# Patient Record
Sex: Female | Born: 1950 | Race: White | Hispanic: No | Marital: Married | Smoking: Former smoker
Health system: Southern US, Community
[De-identification: ages and names within clinical notes are randomized; demographics above are authoritative.]

## PROBLEM LIST (undated history)

## (undated) DIAGNOSIS — Z8619 Personal history of other infectious and parasitic diseases: Secondary | ICD-10-CM

## (undated) DIAGNOSIS — E785 Hyperlipidemia, unspecified: Secondary | ICD-10-CM

## (undated) DIAGNOSIS — J189 Pneumonia, unspecified organism: Secondary | ICD-10-CM

## (undated) DIAGNOSIS — E079 Disorder of thyroid, unspecified: Secondary | ICD-10-CM

## (undated) DIAGNOSIS — N200 Calculus of kidney: Secondary | ICD-10-CM

## (undated) DIAGNOSIS — B019 Varicella without complication: Secondary | ICD-10-CM

## (undated) DIAGNOSIS — I700 Atherosclerosis of aorta: Secondary | ICD-10-CM

## (undated) DIAGNOSIS — M5030 Other cervical disc degeneration, unspecified cervical region: Secondary | ICD-10-CM

## (undated) DIAGNOSIS — R17 Unspecified jaundice: Secondary | ICD-10-CM

## (undated) DIAGNOSIS — E039 Hypothyroidism, unspecified: Secondary | ICD-10-CM

## (undated) DIAGNOSIS — C8221 Follicular lymphoma grade III, unspecified, lymph nodes of head, face, and neck: Secondary | ICD-10-CM

## (undated) DIAGNOSIS — Z87442 Personal history of urinary calculi: Secondary | ICD-10-CM

## (undated) HISTORY — DX: Hyperlipidemia, unspecified: E78.5

## (undated) HISTORY — DX: Unspecified jaundice: R17

## (undated) HISTORY — DX: Varicella without complication: B01.9

## (undated) HISTORY — PX: ROOT CANAL: SHX2363

## (undated) HISTORY — DX: Follicular lymphoma grade III, unspecified, lymph nodes of head, face, and neck: C82.21

## (undated) HISTORY — DX: Disorder of thyroid, unspecified: E07.9

## (undated) HISTORY — DX: Atherosclerosis of aorta: I70.0

## (undated) HISTORY — DX: Personal history of other infectious and parasitic diseases: Z86.19

## (undated) HISTORY — DX: Calculus of kidney: N20.0

---

## 1970-04-28 DIAGNOSIS — K759 Inflammatory liver disease, unspecified: Secondary | ICD-10-CM

## 1970-04-28 HISTORY — DX: Inflammatory liver disease, unspecified: K75.9

## 1977-04-28 HISTORY — PX: APPENDECTOMY: SHX54

## 1978-04-28 HISTORY — PX: VAGINAL HYSTERECTOMY: SUR661

## 1978-04-28 HISTORY — PX: ABDOMINAL HYSTERECTOMY: SHX81

## 2009-04-28 HISTORY — PX: CHOLECYSTECTOMY: SHX55

## 2010-04-28 HISTORY — PX: OTHER SURGICAL HISTORY: SHX169

## 2011-02-11 DIAGNOSIS — R32 Unspecified urinary incontinence: Secondary | ICD-10-CM | POA: Insufficient documentation

## 2011-12-31 ENCOUNTER — Encounter: Payer: Self-pay | Admitting: Internal Medicine

## 2011-12-31 ENCOUNTER — Ambulatory Visit (INDEPENDENT_AMBULATORY_CARE_PROVIDER_SITE_OTHER): Payer: BC Managed Care – PPO | Admitting: Internal Medicine

## 2011-12-31 VITALS — BP 110/70 | HR 62 | Temp 98.2°F | Ht 62.75 in | Wt 131.0 lb

## 2011-12-31 DIAGNOSIS — Z0000 Encounter for general adult medical examination without abnormal findings: Secondary | ICD-10-CM

## 2011-12-31 DIAGNOSIS — M545 Low back pain, unspecified: Secondary | ICD-10-CM

## 2011-12-31 DIAGNOSIS — R232 Flushing: Secondary | ICD-10-CM | POA: Insufficient documentation

## 2011-12-31 DIAGNOSIS — E039 Hypothyroidism, unspecified: Secondary | ICD-10-CM | POA: Insufficient documentation

## 2011-12-31 DIAGNOSIS — N951 Menopausal and female climacteric states: Secondary | ICD-10-CM

## 2011-12-31 MED ORDER — LEVOTHYROXINE SODIUM 100 MCG PO TABS: 100.0000 ug | ORAL_TABLET | Freq: Every day | ORAL | Status: DC

## 2011-12-31 MED ORDER — CYCLOBENZAPRINE HCL 5 MG PO TABS: 5.0000 mg | ORAL_TABLET | Freq: Three times a day (TID) | ORAL | Status: AC | PRN

## 2011-12-31 NOTE — Assessment & Plan Note (Signed)
Symptoms consistent with muscular spasm, resolved with flexeril. Will continue flexeril prn.

## 2011-12-31 NOTE — Assessment & Plan Note (Signed)
Likely related to either hypothyroidism or menopause. Will check TSH with labs. If this is normal, would favor increasing dose of Premarin to see if any improvement.

## 2011-12-31 NOTE — Assessment & Plan Note (Signed)
Symptomatic with some intermittent hot flashes. Will check TSH with labs. Continue synthroid.

## 2011-12-31 NOTE — Progress Notes (Signed)
Subjective:    Patient ID: Samantha Booker, female    DOB: 1950/08/12, 61 y.o.   MRN: [id number]  HPI 61 year old female with history of hypothyroidism, rosacea, and low back pain presents to establish care. She reports she is generally feeling well. Her only concern today is some intermittent hot flashes. These occur throughout the day and night. They are occasionally drenching. Patient reports full compliance with both Synthroid and Premarin. She has been on Premarin since the time of her hysterectomy. She denies taking any herbal medications.  She also notes recent episodes of low back pain which wakes her from sleep. She has been taking Flexeril with improvement in her symptoms. She denies any persistent pain or weakness in her legs. She denies any known injury to her back.  Outpatient Encounter Prescriptions as of 12/31/2011  Medication Sig Dispense Refill  . Ascorbic Acid (VITAMIN C) 1000 MG tablet Take 1,000 mg by mouth daily.      Marland Kitchen aspirin 81 MG tablet Take 81 mg by mouth daily.      Marland Kitchen b complex vitamins tablet Take 1 tablet by mouth daily.      Marland Kitchen estrogens, conjugated, (PREMARIN) 0.625 MG tablet Take 0.625 mg by mouth daily. Take daily for 21 days then do not take for 7 days.      . metroNIDAZOLE (METROCREAM) 0.75 % cream Apply 1 application topically 2 (two) times daily.      . Multiple Vitamin (MULTIVITAMIN) tablet Take 1 tablet by mouth daily.      . cyclobenzaprine (FLEXERIL) 5 MG tablet Take 1 tablet (5 mg total) by mouth 3 (three) times daily as needed for muscle spasms.  90 tablet  3  . levothyroxine (SYNTHROID, LEVOTHROID) 100 MCG tablet Take 1 tablet (100 mcg total) by mouth daily.  90 tablet  3   BP 110/70  Pulse 62  Temp 98.2 F (36.8 C) (Oral)  Ht 5' 2.75" (1.594 m)  Wt 131 lb (59.421 kg)  BMI 23.39 kg/m2  SpO2 98%  Review of Systems  Constitutional: Positive for diaphoresis. Negative for fever, chills, appetite change, fatigue and unexpected weight change.  HENT:  Negative for ear pain, congestion, sore throat, trouble swallowing, neck pain, voice change and sinus pressure.   Eyes: Negative for visual disturbance.  Respiratory: Negative for cough, shortness of breath, wheezing and stridor.   Cardiovascular: Negative for chest pain, palpitations and leg swelling.  Gastrointestinal: Negative for nausea, vomiting, abdominal pain, diarrhea, constipation, blood in stool, abdominal distention and anal bleeding.  Genitourinary: Negative for dysuria and flank pain.  Musculoskeletal: Positive for back pain. Negative for myalgias, arthralgias and gait problem.  Skin: Negative for color change and rash.  Neurological: Negative for dizziness and headaches.  Hematological: Negative for adenopathy. Does not bruise/bleed easily.  Psychiatric/Behavioral: Negative for suicidal ideas, disturbed wake/sleep cycle and dysphoric mood. The patient is not nervous/anxious.        Objective:   Physical Exam  Constitutional: She is oriented to person, place, and time. She appears well-developed and well-nourished. No distress.  HENT:  Head: Normocephalic and atraumatic.  Right Ear: External ear normal.  Left Ear: External ear normal.  Nose: Nose normal.  Mouth/Throat: Oropharynx is clear and moist. No oropharyngeal exudate.  Eyes: Conjunctivae are normal. Pupils are equal, round, and reactive to light. Right eye exhibits no discharge. Left eye exhibits no discharge. No scleral icterus.  Neck: Normal range of motion. Neck supple. No tracheal deviation present. No thyromegaly present.  Cardiovascular: Normal rate,  regular rhythm, normal heart sounds and intact distal pulses.  Exam reveals no gallop and no friction rub.   No murmur heard. Pulmonary/Chest: Effort normal and breath sounds normal. No respiratory distress. She has no wheezes. She has no rales. She exhibits no tenderness.  Abdominal: Soft. Bowel sounds are normal. She exhibits no distension. There is no tenderness.    Musculoskeletal: Normal range of motion. She exhibits no edema and no tenderness.  Lymphadenopathy:    She has no cervical adenopathy.  Neurological: She is alert and oriented to person, place, and time. No cranial nerve deficit. She exhibits normal muscle tone. Coordination normal.  Skin: Skin is warm and dry. No rash noted. She is not diaphoretic. No erythema. No pallor.  Psychiatric: She has a normal mood and affect. Her behavior is normal. Judgment and thought content normal.          Assessment & Plan:

## 2012-01-06 ENCOUNTER — Other Ambulatory Visit: Payer: Self-pay | Admitting: *Deleted

## 2012-01-06 MED ORDER — ESTROGENS CONJUGATED 0.625 MG PO TABS: 0.6250 mg | ORAL_TABLET | Freq: Every day | ORAL | Status: DC

## 2012-01-06 MED ORDER — ROSUVASTATIN CALCIUM 10 MG PO TABS: 10.0000 mg | ORAL_TABLET | Freq: Every day | ORAL | Status: DC

## 2012-01-07 ENCOUNTER — Encounter: Payer: Self-pay | Admitting: Internal Medicine

## 2012-01-27 ENCOUNTER — Other Ambulatory Visit (INDEPENDENT_AMBULATORY_CARE_PROVIDER_SITE_OTHER): Payer: BC Managed Care – PPO

## 2012-01-27 ENCOUNTER — Other Ambulatory Visit: Payer: Self-pay | Admitting: Internal Medicine

## 2012-01-27 DIAGNOSIS — Z0000 Encounter for general adult medical examination without abnormal findings: Secondary | ICD-10-CM

## 2012-01-27 DIAGNOSIS — E039 Hypothyroidism, unspecified: Secondary | ICD-10-CM

## 2012-01-27 LAB — COMPREHENSIVE METABOLIC PANEL
Albumin: 3.6 g/dL (ref 3.5–5.2)
CO2: 25 mEq/L (ref 19–32)
Calcium: 8.8 mg/dL (ref 8.4–10.5)
Chloride: 105 mEq/L (ref 96–112)
GFR: 87.01 mL/min (ref >60.00)
Glucose, Bld: 88 mg/dL (ref 70–99)
Potassium: 4.1 mEq/L (ref 3.5–5.1)
Sodium: 137 mEq/L (ref 135–145)
Total Protein: 6.8 g/dL (ref 6.0–8.3)

## 2012-01-27 LAB — LIPID PANEL: Cholesterol: 170 mg/dL (ref 0–200)

## 2012-01-27 NOTE — Telephone Encounter (Signed)
Refill request: Metronidazole 0.75% topical crem gm #45 Sig: apply to face twice daily Levothyroxine sodium 100 mcg tab #30 Sig: take one tablet each day

## 2012-01-28 ENCOUNTER — Encounter: Payer: Self-pay | Admitting: Internal Medicine

## 2012-01-28 MED ORDER — LEVOTHYROXINE SODIUM 100 MCG PO TABS: 100.0000 ug | ORAL_TABLET | Freq: Every day | ORAL | Status: DC

## 2012-01-28 MED ORDER — METRONIDAZOLE 0.75 % EX CREA: 1.0000 "application " | TOPICAL_CREAM | Freq: Two times a day (BID) | CUTANEOUS | Status: DC

## 2012-02-01 ENCOUNTER — Encounter: Payer: Self-pay | Admitting: Internal Medicine

## 2012-02-02 MED ORDER — ZOSTER VACCINE LIVE 19400 UNT/0.65ML ~~LOC~~ SOLR: 0.6500 mL | Freq: Once | SUBCUTANEOUS | Status: DC

## 2012-03-16 ENCOUNTER — Other Ambulatory Visit: Payer: Self-pay | Admitting: *Deleted

## 2012-03-16 ENCOUNTER — Encounter: Payer: Self-pay | Admitting: Internal Medicine

## 2012-03-16 MED ORDER — ESTRADIOL 1 MG PO TABS: 1.0000 mg | ORAL_TABLET | Freq: Every day | ORAL | Status: DC

## 2012-03-16 NOTE — Telephone Encounter (Signed)
Dr. Dan Humphreys, patient is requesting a Rx for Estradiol be sent to her pharmacy but this medication is not on her med list.  Please advise.

## 2012-03-16 NOTE — Telephone Encounter (Signed)
Please also remind pt that use of oral estrogen replacement carries some increased risk for breast cancer. She has a strong family history of breast cancer, so we should limit dose and time of use of supplemental estrogen as much as possible.

## 2012-03-16 NOTE — Telephone Encounter (Signed)
I am a little confused, it looks like from her email on 02/02/2012 she could try either Estradiol or Premarin and the patient wants to try Estradiol.  Please advise.

## 2012-03-16 NOTE — Telephone Encounter (Signed)
We have that she was on Premarin. Can you please confirm this with her? Fine to refill Premarin.

## 2012-03-16 NOTE — Telephone Encounter (Signed)
Patient advised via mychart message.

## 2012-03-16 NOTE — Telephone Encounter (Signed)
I called in the Rx.

## 2012-04-19 ENCOUNTER — Other Ambulatory Visit: Payer: Self-pay | Admitting: Internal Medicine

## 2012-04-19 NOTE — Telephone Encounter (Signed)
Crestor 10 mg tab  Take one tablet each evening as directed  #30

## 2012-04-20 MED ORDER — ROSUVASTATIN CALCIUM 10 MG PO TABS: 10.0000 mg | ORAL_TABLET | Freq: Every day | ORAL | Status: DC

## 2012-05-03 ENCOUNTER — Ambulatory Visit (INDEPENDENT_AMBULATORY_CARE_PROVIDER_SITE_OTHER): Payer: BC Managed Care – PPO | Admitting: Internal Medicine

## 2012-05-03 ENCOUNTER — Encounter: Payer: Self-pay | Admitting: Internal Medicine

## 2012-05-03 VITALS — BP 120/80 | HR 60 | Temp 98.7°F | Ht 62.75 in | Wt 133.5 lb

## 2012-05-03 DIAGNOSIS — N951 Menopausal and female climacteric states: Secondary | ICD-10-CM | POA: Insufficient documentation

## 2012-05-03 DIAGNOSIS — E785 Hyperlipidemia, unspecified: Secondary | ICD-10-CM

## 2012-05-03 DIAGNOSIS — Z0000 Encounter for general adult medical examination without abnormal findings: Secondary | ICD-10-CM

## 2012-05-03 LAB — COMPREHENSIVE METABOLIC PANEL
AST: 29 U/L (ref 0–37)
Albumin: 3.7 g/dL (ref 3.5–5.2)
Alkaline Phosphatase: 43 U/L (ref 39–117)
BUN: 15 mg/dL (ref 6–23)
Creatinine, Ser: 0.7 mg/dL (ref 0.4–1.2)
Potassium: 4.5 mEq/L (ref 3.5–5.1)
Total Bilirubin: 0.6 mg/dL (ref 0.3–1.2)

## 2012-05-03 MED ORDER — ATORVASTATIN CALCIUM 20 MG PO TABS: 20.0000 mg | ORAL_TABLET | Freq: Every day | ORAL | Status: DC

## 2012-05-03 NOTE — Progress Notes (Signed)
Subjective:    Patient ID: Samantha Booker, female    DOB: 08-04-1950, 62 y.o.   MRN: [id number]  HPI 62 year old female with history of hypothyroidism and hyperlipidemia presents for annual exam. She reports she is generally doing well. She has no concerns today. She would like to change from Crestor to Lipitor because of cost of the medication.  Outpatient Encounter Prescriptions as of 05/03/2012  Medication Sig Dispense Refill  . Ascorbic Acid (VITAMIN C) 1000 MG tablet Take 1,000 mg by mouth daily.      Marland Kitchen aspirin 81 MG tablet Take 81 mg by mouth daily.      Marland Kitchen b complex vitamins tablet Take 1 tablet by mouth daily.      Marland Kitchen estradiol (ESTRACE) 1 MG tablet Take 1 tablet (1 mg total) by mouth daily. Take 1 tablet daily x 3 weeks then 1 week off, then repeat.  21 tablet  3  . levothyroxine (SYNTHROID, LEVOTHROID) 100 MCG tablet Take 1 tablet (100 mcg total) by mouth daily.  90 tablet  3  . metroNIDAZOLE (METROCREAM) 0.75 % cream Apply 1 application topically 2 (two) times daily.  45 g  2  . Multiple Vitamin (MULTIVITAMIN) tablet Take 1 tablet by mouth daily.      . [DISCONTINUED] rosuvastatin (CRESTOR) 10 MG tablet Take 1 tablet (10 mg total) by mouth daily.  30 tablet  5  . atorvastatin (LIPITOR) 20 MG tablet Take 1 tablet (20 mg total) by mouth daily.  90 tablet  3   BP 120/80  Pulse 60  Temp 98.7 F (37.1 C) (Oral)  Ht 5' 2.75" (1.594 m)  Wt 133 lb 8 oz (60.555 kg)  BMI 23.84 kg/m2  SpO2 98%  Review of Systems  Constitutional: Negative for fever, chills, appetite change, fatigue and unexpected weight change.  HENT: Negative for ear pain, congestion, sore throat, trouble swallowing, neck pain, voice change and sinus pressure.   Eyes: Negative for visual disturbance.  Respiratory: Negative for cough, shortness of breath, wheezing and stridor.   Cardiovascular: Negative for chest pain, palpitations and leg swelling.  Gastrointestinal: Negative for nausea, vomiting, abdominal pain,  diarrhea, constipation, blood in stool, abdominal distention and anal bleeding.  Genitourinary: Negative for dysuria and flank pain.  Musculoskeletal: Negative for myalgias, arthralgias and gait problem.  Skin: Negative for color change and rash.  Neurological: Negative for dizziness and headaches.  Hematological: Negative for adenopathy. Does not bruise/bleed easily.  Psychiatric/Behavioral: Negative for suicidal ideas, sleep disturbance and dysphoric mood. The patient is not nervous/anxious.        Objective:   Physical Exam  Constitutional: She is oriented to person, place, and time. She appears well-developed and well-nourished. No distress.  HENT:  Head: Normocephalic and atraumatic.  Right Ear: External ear normal.  Left Ear: External ear normal.  Nose: Nose normal.  Mouth/Throat: Oropharynx is clear and moist. No oropharyngeal exudate.  Eyes: Conjunctivae normal are normal. Pupils are equal, round, and reactive to light. Right eye exhibits no discharge. Left eye exhibits no discharge. No scleral icterus.  Neck: Normal range of motion. Neck supple. No tracheal deviation present. No thyromegaly present.  Cardiovascular: Normal rate, regular rhythm, normal heart sounds and intact distal pulses.  Exam reveals no gallop and no friction rub.   No murmur heard. Pulmonary/Chest: Effort normal and breath sounds normal. No accessory muscle usage. Not tachypneic. No respiratory distress. She has no decreased breath sounds. She has no wheezes. She has no rhonchi. She has no rales. She  exhibits no tenderness. Right breast exhibits no inverted nipple, no mass, no nipple discharge, no skin change and no tenderness. Left breast exhibits no inverted nipple, no mass, no nipple discharge, no skin change and no tenderness. Breasts are symmetrical.  Abdominal: Soft. Bowel sounds are normal. She exhibits no distension and no mass. There is no tenderness. There is no rebound and no guarding.    Musculoskeletal: Normal range of motion. She exhibits no edema and no tenderness.  Lymphadenopathy:    She has no cervical adenopathy.  Neurological: She is alert and oriented to person, place, and time. No cranial nerve deficit. She exhibits normal muscle tone. Coordination normal.  Skin: Skin is warm and dry. No rash noted. She is not diaphoretic. No erythema. No pallor.  Psychiatric: She has a normal mood and affect. Her behavior is normal. Judgment and thought content normal.          Assessment & Plan:

## 2012-05-03 NOTE — Assessment & Plan Note (Signed)
Will change to atorvastatin from crestor because of cost. Will check LFTs with labs today and lipids and LFTs in 6 months.

## 2012-05-03 NOTE — Assessment & Plan Note (Signed)
Gen med exam normal today including breast exam. Pelvic deferred as recently completed with GYN in 05/2011. Encouraged continued healthy diet and regular physical activity. Will recheck LFTs with labs today, given slight elevation on check in 12/2011. Will schedule mammogram. Follow up in 6 months and prn.

## 2012-05-18 ENCOUNTER — Encounter: Payer: Self-pay | Admitting: Internal Medicine

## 2012-06-12 ENCOUNTER — Other Ambulatory Visit: Payer: Self-pay

## 2012-10-08 ENCOUNTER — Encounter: Payer: Self-pay | Admitting: Internal Medicine

## 2012-10-08 ENCOUNTER — Ambulatory Visit (INDEPENDENT_AMBULATORY_CARE_PROVIDER_SITE_OTHER): Payer: BC Managed Care – PPO | Admitting: Internal Medicine

## 2012-10-08 VITALS — BP 120/80 | HR 68 | Temp 98.0°F | Wt 136.0 lb

## 2012-10-08 DIAGNOSIS — M25519 Pain in unspecified shoulder: Secondary | ICD-10-CM

## 2012-10-08 DIAGNOSIS — Z7189 Other specified counseling: Secondary | ICD-10-CM

## 2012-10-08 DIAGNOSIS — M25511 Pain in right shoulder: Secondary | ICD-10-CM

## 2012-10-08 DIAGNOSIS — Z23 Encounter for immunization: Secondary | ICD-10-CM

## 2012-10-08 DIAGNOSIS — Z7184 Encounter for health counseling related to travel: Secondary | ICD-10-CM

## 2012-10-08 DIAGNOSIS — E785 Hyperlipidemia, unspecified: Secondary | ICD-10-CM

## 2012-10-08 DIAGNOSIS — E039 Hypothyroidism, unspecified: Secondary | ICD-10-CM

## 2012-10-08 DIAGNOSIS — L719 Rosacea, unspecified: Secondary | ICD-10-CM | POA: Insufficient documentation

## 2012-10-08 LAB — COMPREHENSIVE METABOLIC PANEL
ALT: 21 U/L (ref 0–35)
AST: 25 U/L (ref 0–37)
Alkaline Phosphatase: 43 U/L (ref 39–117)
Creatinine, Ser: 0.7 mg/dL (ref 0.4–1.2)
Total Bilirubin: 0.5 mg/dL (ref 0.3–1.2)

## 2012-10-08 MED ORDER — CIPROFLOXACIN HCL 500 MG PO TABS: 500.0000 mg | ORAL_TABLET | Freq: Two times a day (BID) | ORAL | Status: DC

## 2012-10-08 MED ORDER — MELOXICAM 15 MG PO TABS: 15.0000 mg | ORAL_TABLET | Freq: Every day | ORAL | Status: DC

## 2012-10-08 NOTE — Progress Notes (Signed)
Subjective:    Patient ID: Samantha Booker, female    DOB: [birth year], 62 y.o.   MRN: [id number]  HPI 62YO female with h/o hypothyroidism presents for acute visit complaining of 1 month h/o right shoulder pain. Denies any trauma to shoulder. Has been exercising at a local gym and lifting weights. Notes pain with any movement of right arm. No decrease in strength or change in sensation.  Not taking any medication for this.  Also, pt reports she is planning to travel to [place] in August 2014. Questions which medication or vaccinations she will need.  Outpatient Encounter Prescriptions as of 10/08/2012  Medication Sig Dispense Refill  . Ascorbic Acid (VITAMIN C) 1000 MG tablet Take 1,000 mg by mouth daily.      Marland Kitchen aspirin 81 MG tablet Take 81 mg by mouth daily.      Marland Kitchen atorvastatin (LIPITOR) 20 MG tablet Take 1 tablet (20 mg total) by mouth daily.  90 tablet  3  . b complex vitamins tablet Take 1 tablet by mouth daily.      Marland Kitchen levothyroxine (SYNTHROID, LEVOTHROID) 100 MCG tablet Take 1 tablet (100 mcg total) by mouth daily.  90 tablet  3  . metroNIDAZOLE (METROCREAM) 0.75 % cream Apply 1 application topically 2 (two) times daily.  45 g  2  . Multiple Vitamin (MULTIVITAMIN) tablet Take 1 tablet by mouth daily.      . ciprofloxacin (CIPRO) 500 MG tablet Take 1 tablet (500 mg total) by mouth 2 (two) times daily.  14 tablet  0  . estradiol (ESTRACE) 1 MG tablet Take 1 tablet (1 mg total) by mouth daily. Take 1 tablet daily x 3 weeks then 1 week off, then repeat.  21 tablet  3  . meloxicam (MOBIC) 15 MG tablet Take 1 tablet (15 mg total) by mouth daily.  30 tablet  0   No facility-administered encounter medications on file as of 10/08/2012.   BP 120/80  Pulse 68  Temp(Src) 98 F (36.7 C) (Oral)  Wt 136 lb (61.689 kg)  BMI 24.28 kg/m2  SpO2 97%  Review of Systems  Constitutional: Negative for fever, chills, appetite change, fatigue and unexpected weight change.  HENT: Negative for ear pain,  congestion, sore throat, trouble swallowing, neck pain, voice change and sinus pressure.   Eyes: Negative for visual disturbance.  Respiratory: Negative for cough, shortness of breath, wheezing and stridor.   Cardiovascular: Negative for chest pain, palpitations and leg swelling.  Gastrointestinal: Negative for nausea, vomiting, abdominal pain, diarrhea, constipation, blood in stool, abdominal distention and anal bleeding.  Genitourinary: Negative for dysuria and flank pain.  Musculoskeletal: Positive for myalgias and arthralgias. Negative for gait problem.  Skin: Negative for color change and rash.  Neurological: Negative for dizziness and headaches.  Hematological: Negative for adenopathy. Does not bruise/bleed easily.  Psychiatric/Behavioral: Negative for suicidal ideas, sleep disturbance and dysphoric mood. The patient is not nervous/anxious.        Objective:   Physical Exam  Constitutional: She is oriented to person, place, and time. She appears well-developed and well-nourished. No distress.  HENT:  Head: Normocephalic and atraumatic.  Right Ear: External ear normal.  Left Ear: External ear normal.  Nose: Nose normal.  Mouth/Throat: Oropharynx is clear and moist. No oropharyngeal exudate.  Eyes: Conjunctivae are normal. Pupils are equal, round, and reactive to light. Right eye exhibits no discharge. Left eye exhibits no discharge. No scleral icterus.  Neck: Normal range of motion. Neck supple. No tracheal deviation present.  No thyromegaly present.  Cardiovascular: Normal rate, regular rhythm, normal heart sounds and intact distal pulses.  Exam reveals no gallop and no friction rub.   No murmur heard. Pulmonary/Chest: Effort normal and breath sounds normal. No accessory muscle usage. Not tachypneic. No respiratory distress. She has no decreased breath sounds. She has no wheezes. She has no rhonchi. She has no rales. She exhibits no tenderness.  Musculoskeletal: Normal range of  motion. She exhibits no edema and no tenderness.       Right shoulder: She exhibits tenderness (over lateral deltoid) and pain. She exhibits normal range of motion, no bony tenderness, no swelling, no effusion, no crepitus, no spasm and normal strength.  Lymphadenopathy:    She has no cervical adenopathy.  Neurological: She is alert and oriented to person, place, and time. No cranial nerve deficit. She exhibits normal muscle tone. Coordination normal.  Skin: Skin is warm and dry. No rash noted. She is not diaphoretic. No erythema. No pallor.  Psychiatric: She has a normal mood and affect. Her behavior is normal. Judgment and thought content normal.          Assessment & Plan:

## 2012-10-08 NOTE — Patient Instructions (Addendum)
Shoulder Pain  The shoulder is the joint that connects your arms to your body. The bones that form the shoulder joint include the upper arm bone (humerus), the shoulder blade (scapula), and the collarbone (clavicle). The top of the humerus is shaped like a ball and fits into a rather flat socket on the scapula (glenoid cavity). A combination of muscles and strong, fibrous tissues that connect muscles to bones (tendons) support your shoulder joint and hold the ball in the socket. Small, fluid-filled sacs (bursae) are located in different areas of the joint. They act as cushions between the bones and the overlying soft tissues and help reduce friction between the gliding tendons and the bone as you move your arm. Your shoulder joint allows a wide range of motion in your arm. This range of motion allows you to do things like scratch your back or throw a ball. However, this range of motion also makes your shoulder more prone to pain from overuse and injury.  Causes of shoulder pain can originate from both injury and overuse and usually can be grouped in the following four categories:   Redness, swelling, and pain (inflammation) of the tendon (tendinitis) or the bursae (bursitis).   Instability, such as a dislocation of the joint.   Inflammation of the joint (arthritis).   Broken bone (fracture).  HOME CARE INSTRUCTIONS    Apply ice to the sore area.   Put ice in a plastic bag.   Place a towel between your skin and the bag.   Leave the ice on for 15-20 minutes, 3-4 times per day for the first 2 days.   Stop using cold packs if they do not help with the pain.   If you have a shoulder sling or immobilizer, wear it as long as your caregiver instructs. Only remove it to shower or bathe. Move your arm as little as possible, but keep your hand moving to prevent swelling.   Squeeze a soft ball or foam pad as much as possible to help prevent swelling.   Only take over-the-counter or prescription medicines for pain,  discomfort, or fever as directed by your caregiver.  SEEK MEDICAL CARE IF:    Your shoulder pain increases, or new pain develops in your arm, hand, or fingers.   Your hand or fingers become cold and numb.   Your pain is not relieved with medicines.  SEEK IMMEDIATE MEDICAL CARE IF:    Your arm, hand, or fingers are numb or tingling.   Your arm, hand, or fingers are significantly swollen or turn white or blue.  MAKE SURE YOU:    Understand these instructions.   Will watch your condition.   Will get help right away if you are not doing well or get worse.  Document Released: 01/22/2005 Document Revised: 01/07/2012 Document Reviewed: 03/29/2011  [employer] Patient Information 2014 [employer]

## 2012-10-08 NOTE — Assessment & Plan Note (Signed)
Will check TSH with labs today. 

## 2012-10-08 NOTE — Assessment & Plan Note (Addendum)
Reviewed recent CDC recommendations for travel to [place]. Pt has h/o Hep B infection. Will give Hep A vaccine today. Recommended that she get Typhoid vaccine at health dept. Will give Cipro 500mg  to take bid x 3 days if needed for traveler's diarrhea.  Discussed drinking bottled water.

## 2012-10-08 NOTE — Assessment & Plan Note (Signed)
Will check LFTs with labs today. 

## 2012-10-08 NOTE — Assessment & Plan Note (Signed)
Right shoulder pain most consistent with strain of Deltoid. Encouraged pt to ice area several times per day. Will start meloxicam. Discussed referral to PT or sports medicine, however pt would like to hold off for now. Pt will follow up by email in 1 week. Follow up in clinic 4 weeks and prn.

## 2012-10-16 ENCOUNTER — Encounter: Payer: Self-pay | Admitting: Internal Medicine

## 2012-11-13 ENCOUNTER — Encounter: Payer: Self-pay | Admitting: Internal Medicine

## 2012-11-24 ENCOUNTER — Encounter: Payer: Self-pay | Admitting: Internal Medicine

## 2012-11-24 ENCOUNTER — Ambulatory Visit (INDEPENDENT_AMBULATORY_CARE_PROVIDER_SITE_OTHER): Payer: BC Managed Care – PPO | Admitting: Internal Medicine

## 2012-11-24 VITALS — BP 140/80 | HR 61 | Temp 98.4°F | Wt 136.0 lb

## 2012-11-24 DIAGNOSIS — M25511 Pain in right shoulder: Secondary | ICD-10-CM

## 2012-11-24 DIAGNOSIS — M25519 Pain in unspecified shoulder: Secondary | ICD-10-CM

## 2012-11-24 NOTE — Addendum Note (Signed)
Addended by: Theola Sequin on: 11/24/2012 10:59 AM   Modules accepted: Orders

## 2012-11-24 NOTE — Assessment & Plan Note (Signed)
Persistent right shoulder pain despite conservative measures including NSAIDS, rest >4 weeks. Given weakness on abduction and rotation of right arm, I am concerned about rotator cuff tear. Will set up MRI right shoulder for further evaluation.

## 2012-11-24 NOTE — Progress Notes (Signed)
Subjective:    Patient ID: Samantha Booker, female    DOB: May 19, 1950, 62 y.o.   MRN: [id number]  HPI 61YO female presents to follow up right shoulder pain. Persistent pain >4 weeks. Pain occurs with movement of right arm. Described as aching that does not radiate. Pt notes weakness and difficulty with abduction of right arm. Has limited activity and been taking Meloxicam with no improvement. No new concerns today.  Outpatient Encounter Prescriptions as of 11/24/2012  Medication Sig Dispense Refill  . Ascorbic Acid (VITAMIN C) 1000 MG tablet Take 1,000 mg by mouth daily.      Marland Kitchen aspirin 81 MG tablet Take 81 mg by mouth daily.      Marland Kitchen b complex vitamins tablet Take 1 tablet by mouth daily.      Marland Kitchen estradiol (ESTRACE) 1 MG tablet Take 1 tablet (1 mg total) by mouth daily. Take 1 tablet daily x 3 weeks then 1 week off, then repeat.  21 tablet  3  . levothyroxine (SYNTHROID, LEVOTHROID) 100 MCG tablet Take 1 tablet (100 mcg total) by mouth daily.  90 tablet  3  . meloxicam (MOBIC) 15 MG tablet Take 1 tablet (15 mg total) by mouth daily.  30 tablet  0  . metroNIDAZOLE (METROCREAM) 0.75 % cream Apply 1 application topically 2 (two) times daily.  45 g  2  . Multiple Vitamin (MULTIVITAMIN) tablet Take 1 tablet by mouth daily.      Marland Kitchen atorvastatin (LIPITOR) 20 MG tablet Take 1 tablet (20 mg total) by mouth daily.  90 tablet  3   No facility-administered encounter medications on file as of 11/24/2012.   BP 140/80  Pulse 61  Temp(Src) 98.4 F (36.9 C) (Oral)  Wt 136 lb (61.689 kg)  BMI 24.28 kg/m2  SpO2 97%  Review of Systems  Constitutional: Negative for fever, chills, appetite change, fatigue and unexpected weight change.  HENT: Negative for ear pain, congestion, sore throat, trouble swallowing, neck pain, voice change and sinus pressure.   Eyes: Negative for visual disturbance.  Respiratory: Negative for cough, shortness of breath, wheezing and stridor.   Cardiovascular: Negative for chest pain,  palpitations and leg swelling.  Gastrointestinal: Negative for nausea, vomiting, abdominal pain, diarrhea, constipation, blood in stool, abdominal distention and anal bleeding.  Genitourinary: Negative for dysuria and flank pain.  Musculoskeletal: Positive for myalgias and arthralgias. Negative for gait problem.  Skin: Negative for color change and rash.  Neurological: Positive for weakness (abduction right arm). Negative for dizziness and headaches.  Hematological: Negative for adenopathy. Does not bruise/bleed easily.  Psychiatric/Behavioral: Negative for suicidal ideas, sleep disturbance and dysphoric mood. The patient is not nervous/anxious.        Objective:   Physical Exam  Constitutional: She is oriented to person, place, and time. She appears well-developed and well-nourished. No distress.  HENT:  Head: Normocephalic and atraumatic.  Right Ear: External ear normal.  Left Ear: External ear normal.  Nose: Nose normal.  Mouth/Throat: Oropharynx is clear and moist. No oropharyngeal exudate.  Eyes: Conjunctivae are normal. Pupils are equal, round, and reactive to light. Right eye exhibits no discharge. Left eye exhibits no discharge. No scleral icterus.  Neck: Normal range of motion. Neck supple. No tracheal deviation present. No thyromegaly present.  Cardiovascular: Normal rate, regular rhythm, normal heart sounds and intact distal pulses.  Exam reveals no gallop and no friction rub.   No murmur heard. Pulmonary/Chest: Effort normal and breath sounds normal. No accessory muscle usage. Not tachypneic. No  respiratory distress. She has no decreased breath sounds. She has no wheezes. She has no rhonchi. She has no rales. She exhibits no tenderness.  Musculoskeletal: She exhibits no edema and no tenderness.       Right shoulder: She exhibits decreased range of motion, tenderness, pain and decreased strength (abduction right arm). She exhibits no bony tenderness.  Lymphadenopathy:    She  has no cervical adenopathy.  Neurological: She is alert and oriented to person, place, and time. No cranial nerve deficit. She exhibits normal muscle tone. Coordination normal.  Skin: Skin is warm and dry. No rash noted. She is not diaphoretic. No erythema. No pallor.  Psychiatric: She has a normal mood and affect. Her behavior is normal. Judgment and thought content normal.          Assessment & Plan:

## 2012-12-06 ENCOUNTER — Ambulatory Visit: Payer: Self-pay | Admitting: Internal Medicine

## 2012-12-06 ENCOUNTER — Telehealth: Payer: Self-pay | Admitting: Internal Medicine

## 2012-12-06 DIAGNOSIS — IMO0001 Reserved for inherently not codable concepts without codable children: Secondary | ICD-10-CM

## 2012-12-06 NOTE — Telephone Encounter (Signed)
MRI of the right shoulder showed inflammation of the suprasinatus tendon with a tine partial tear. I would like to set up orthopedics evaluation. Please ask pt if there is an orthopedic surgeon she prefers.

## 2012-12-07 ENCOUNTER — Encounter: Payer: Self-pay | Admitting: Internal Medicine

## 2012-12-07 NOTE — Telephone Encounter (Signed)
Patient informed and would like to see Dr. Ernest Pine

## 2012-12-15 ENCOUNTER — Encounter: Payer: Self-pay | Admitting: Internal Medicine

## 2013-03-03 ENCOUNTER — Other Ambulatory Visit: Payer: Self-pay

## 2013-04-05 ENCOUNTER — Encounter: Payer: BC Managed Care – PPO | Admitting: Internal Medicine

## 2014-05-11 ENCOUNTER — Ambulatory Visit: Payer: Self-pay | Admitting: Nurse Practitioner

## 2014-10-19 ENCOUNTER — Ambulatory Visit (INDEPENDENT_AMBULATORY_CARE_PROVIDER_SITE_OTHER): Payer: Medicare Other | Admitting: Family Medicine

## 2014-10-19 ENCOUNTER — Encounter: Payer: Self-pay | Admitting: Family Medicine

## 2014-10-19 VITALS — BP 126/72 | HR 74 | Temp 98.2°F | Resp 16 | Ht 63.0 in | Wt 124.1 lb

## 2014-10-19 DIAGNOSIS — R7989 Other specified abnormal findings of blood chemistry: Secondary | ICD-10-CM

## 2014-10-19 DIAGNOSIS — E039 Hypothyroidism, unspecified: Secondary | ICD-10-CM | POA: Diagnosis not present

## 2014-10-19 DIAGNOSIS — Z9889 Other specified postprocedural states: Secondary | ICD-10-CM

## 2014-10-19 DIAGNOSIS — M8580 Other specified disorders of bone density and structure, unspecified site: Secondary | ICD-10-CM | POA: Insufficient documentation

## 2014-10-19 DIAGNOSIS — E785 Hyperlipidemia, unspecified: Secondary | ICD-10-CM | POA: Diagnosis not present

## 2014-10-19 NOTE — Progress Notes (Signed)
Name: Samantha Booker   MRN: [id number]    DOB: Oct 24, 1950   Date:10/19/2014       Progress Note  Subjective  Chief Complaint  Chief Complaint  Patient presents with  . Establish Care    Patient was with Dr. Edison Pace with the [facility]    HPI  Hypothyroidism: Patient presents for evaluation of thyroid function. Symptoms consist of denies fatigue, weight changes, heat/cold intolerance, bowel/skin changes or CVS symptoms. Symptoms have present for several years. The symptoms are mild.  The problem has been stable.  Previous thyroid studies include TSH, free thyroxine index and free thyroxine. The hypothyroidism is due to hypothyroidism.    Patient Active Problem List   Diagnosis Date Noted  . Hyperlipidemia LDL goal <100 10/19/2014  . Low serum vitamin D 10/19/2014  . Osteopenia 10/19/2014  . Right shoulder pain 10/08/2012  . Rosacea 10/08/2012  . Menopausal symptoms 05/03/2012  . Hypothyroidism, acquired 12/31/2011  . Low back pain 12/31/2011  . Hot flashes 12/31/2011    History  Substance Use Topics  . Smoking status: Former Research scientist (life sciences)  . Smokeless tobacco: Not on file     Comment: patient quit in 1993  . Alcohol Use: No     Comment: socially     Current outpatient prescriptions:  .  Ascorbic Acid (VITAMIN C) 1000 MG tablet, Take by mouth., Disp: , Rfl:  .  aspirin EC 81 MG tablet, Take by mouth., Disp: , Rfl:  .  B Complex Vitamins (VITAMIN-B COMPLEX) TABS, Take by mouth., Disp: , Rfl:  .  ERGOCALCIFEROL PO, Take by mouth., Disp: , Rfl:  .  estradiol (ESTRACE) 1 MG tablet, Take by mouth every other day. Taking 1/2 tablet, Disp: , Rfl:  .  levothyroxine (SYNTHROID, LEVOTHROID) 100 MCG tablet, , Disp: , Rfl:  .  meloxicam (MOBIC) 15 MG tablet, Take by mouth., Disp: , Rfl:  .  metroNIDAZOLE (METROCREAM) 0.75 % cream, once daily. Facial cream, Disp: , Rfl:  .  Multiple Vitamin (MULTIVITAMIN) tablet, Take 1 tablet by mouth daily., Disp: , Rfl:  .  omega-3 acid ethyl  esters (LOVAZA) 1 G capsule, Take by mouth 2 (two) times daily., Disp: , Rfl:  .  Probiotic Product (PROBIOTIC PO), Take by mouth., Disp: , Rfl:   Past Surgical History  Procedure Laterality Date  . Cholecystectomy  2011  . Appendectomy  1979  . Suburethral sling  2012    Dr. Audie Box  . Abdominal hysterectomy  1980    precancerous cells    Family History  Problem Relation Age of Onset  . Diabetes Mother   . Heart disease Mother     CABG 2007  . Hyperlipidemia Mother   . COPD Mother   . Hyperlipidemia Father   . Heart disease Father   . Cancer Maternal Grandmother     breast  . Cancer Maternal Aunt     breast  . Cancer Cousin     brain  . Cancer Cousin     melanoma    Allergies  Allergen Reactions  . Penicillins Other (See Comments)    Doesn't work      Review of Systems  CONSTITUTIONAL: No significant weight changes, fever, chills, weakness or fatigue.  HEENT:  - Eyes: No visual changes.  - Ears: No auditory changes. No pain.  - Nose: No sneezing, congestion, runny nose. - Throat: No sore throat. No changes in swallowing. SKIN: No rash or itching.  CARDIOVASCULAR: No chest pain, chest pressure  or chest discomfort. No palpitations or edema.  RESPIRATORY: No shortness of breath, cough or sputum.  GASTROINTESTINAL: No anorexia, nausea, vomiting. No changes in bowel habits. No abdominal pain or blood.  GENITOURINARY: No dysuria. No frequency. No discharge.  NEUROLOGICAL: No headache, dizziness, syncope, paralysis, ataxia, numbness or tingling in the extremities. No memory changes. No change in bowel or bladder control.  MUSCULOSKELETAL: No joint pain. No muscle pain. HEMATOLOGIC: No anemia, bleeding or bruising.  LYMPHATICS: No enlarged lymph nodes.  PSYCHIATRIC: No change in mood. No change in sleep pattern.  ENDOCRINOLOGIC: No reports of sweating, cold or heat intolerance. No polyuria or polydipsia.     Objective  BP 126/72 mmHg  Pulse 74  Temp(Src) 98.2  F (36.8 C) (Oral)  Resp 16  Ht 5\' 3"  (1.6 m)  Wt 124 lb 1.6 oz (56.291 kg)  BMI 21.99 kg/m2  SpO2 96%  Physical Exam  Constitutional: Patient appears well-developed and well-nourished. In no distress.  HEENT:  - Head: Normocephalic and atraumatic.  - Ears: Bilateral TMs gray, no erythema or effusion - Nose: Nasal mucosa moist - Mouth/Throat: Oropharynx is clear and moist. No tonsillar hypertrophy or erythema. No post nasal drainage.  - Eyes: Conjunctivae clear, EOM movements normal. PERRLA. No scleral icterus.  Neck: Normal range of motion. Neck supple. No JVD present. No thyromegaly present.  Cardiovascular: Normal rate, regular rhythm and normal heart sounds.  No murmur heard.  Pulmonary/Chest: Effort normal and breath sounds normal. No respiratory distress. Musculoskeletal: Normal range of motion bilateral UE and LE, no joint effusions. Peripheral vascular: Bilateral LE no edema. Neurological: CN II-XII grossly intact with no focal deficits. Alert and oriented to person, place, and time. Coordination, balance, strength, speech and gait are normal.  Skin: Skin is warm and dry. No rash noted. No erythema.  Psychiatric: Patient has a normal mood and affect. Behavior is normal in office today. Judgment and thought content normal in office today.   Assessment & Plan  1. Hypothyroidism, acquired Due for routine lab work.  - CBC with Differential/Platelet - Comprehensive metabolic panel - TSH - Lipid panel - T3, free - T4, free  2. Hyperlipidemia LDL goal <100 Due for routine lab work. Not on statin therapy and is taking estrogen supplement. Patient counseled on increased risk for atherosclerosis leading to CAD, CVA, other vascular complications.  - CBC with Differential/Platelet - Comprehensive metabolic panel - TSH - Lipid panel - T3, free - T4, free  3. Low serum vitamin D Insurance restrictions would not allow me to order repeat serum Vit D levels.  4. History of  bladder surgery No problems reported. F/U with Urologist in August as scheduled.

## 2014-11-03 LAB — CBC WITH DIFFERENTIAL/PLATELET
Basophils Absolute: 0.1 10*3/uL (ref 0.0–0.2)
Basos: 1 %
EOS (ABSOLUTE): 0.2 10*3/uL (ref 0.0–0.4)
EOS: 4 %
HEMATOCRIT: 42.4 % (ref 34.0–46.6)
HEMOGLOBIN: 14.8 g/dL (ref 11.1–15.9)
IMMATURE GRANS (ABS): 0 10*3/uL (ref 0.0–0.1)
IMMATURE GRANULOCYTES: 0 %
LYMPHS ABS: 2.4 10*3/uL (ref 0.7–3.1)
Lymphs: 38 %
MCH: 32.2 pg (ref 26.6–33.0)
MCHC: 34.9 g/dL (ref 31.5–35.7)
MCV: 92 fL (ref 79–97)
Monocytes Absolute: 0.5 10*3/uL (ref 0.1–0.9)
Monocytes: 9 %
Neutrophils Absolute: 3.1 10*3/uL (ref 1.4–7.0)
Neutrophils: 48 %
Platelets: 260 10*3/uL (ref 150–379)
RBC: 4.59 x10E6/uL (ref 3.77–5.28)
RDW: 13.4 % (ref 12.3–15.4)
WBC: 6.4 10*3/uL (ref 3.4–10.8)

## 2014-11-04 LAB — COMPREHENSIVE METABOLIC PANEL WITH GFR
ALT: 14 IU/L (ref 0–32)
AST: 24 IU/L (ref 0–40)
Albumin/Globulin Ratio: 1.8 (ref 1.1–2.5)
Albumin: 4.6 g/dL (ref 3.6–4.8)
Alkaline Phosphatase: 61 IU/L (ref 39–117)
BUN/Creatinine Ratio: 18 (ref 11–26)
BUN: 14 mg/dL (ref 8–27)
Bilirubin Total: 0.4 mg/dL (ref 0.0–1.2)
CO2: 22 mmol/L (ref 18–29)
Calcium: 9.4 mg/dL (ref 8.7–10.3)
Chloride: 100 mmol/L (ref 97–108)
Creatinine, Ser: 0.78 mg/dL (ref 0.57–1.00)
GFR calc Af Amer: 92 mL/min/1.73
GFR calc non Af Amer: 80 mL/min/1.73
Globulin, Total: 2.6 g/dL (ref 1.5–4.5)
Glucose: 94 mg/dL (ref 65–99)
Potassium: 4.1 mmol/L (ref 3.5–5.2)
Sodium: 140 mmol/L (ref 134–144)
Total Protein: 7.2 g/dL (ref 6.0–8.5)

## 2014-11-04 LAB — LIPID PANEL
Chol/HDL Ratio: 5.3 ratio units — ABNORMAL HIGH (ref 0.0–4.4)
Cholesterol, Total: 311 mg/dL — ABNORMAL HIGH (ref 100–199)
HDL: 59 mg/dL (ref >39)
LDL CALC: 205 mg/dL — AB (ref 0–99)
Triglycerides: 234 mg/dL — ABNORMAL HIGH (ref 0–149)
VLDL Cholesterol Cal: 47 mg/dL — ABNORMAL HIGH (ref 5–40)

## 2014-11-04 LAB — T4, FREE: Free T4: 1.25 ng/dL (ref 0.82–1.77)

## 2014-11-04 LAB — TSH: TSH: 1.27 u[IU]/mL (ref 0.450–4.500)

## 2014-11-04 LAB — T3, FREE: T3, Free: 2.3 pg/mL (ref 2.0–4.4)

## 2014-11-06 ENCOUNTER — Telehealth: Payer: Self-pay

## 2014-11-06 ENCOUNTER — Encounter: Payer: Self-pay | Admitting: Family Medicine

## 2014-11-06 ENCOUNTER — Telehealth: Payer: Self-pay | Admitting: Family Medicine

## 2014-11-06 DIAGNOSIS — E785 Hyperlipidemia, unspecified: Secondary | ICD-10-CM

## 2014-11-06 NOTE — Telephone Encounter (Signed)
Contacted this patient to review her results from recent blood work and patient stated she was once on cholesterol medication in the past and really did not want to go back on the medication. She stated she will think about it and will call us back.

## 2014-11-06 NOTE — Telephone Encounter (Signed)
Error

## 2014-11-06 NOTE — Telephone Encounter (Signed)
Reviewed patient refusal to start statin therapy.

## 2014-11-07 MED ORDER — ATORVASTATIN CALCIUM 20 MG PO TABS: 20.0000 mg | ORAL_TABLET | Freq: Every day | ORAL | Status: DC

## 2014-11-07 NOTE — Telephone Encounter (Signed)
Per patient call and request I have ordered atorvastatin 20mg  one po at bedtime every night.

## 2015-04-11 ENCOUNTER — Encounter: Payer: Self-pay | Admitting: Family Medicine

## 2015-06-05 ENCOUNTER — Encounter: Payer: Medicare Other | Admitting: Family Medicine

## 2015-06-06 ENCOUNTER — Encounter: Payer: Self-pay | Admitting: Family Medicine

## 2015-06-06 ENCOUNTER — Ambulatory Visit (INDEPENDENT_AMBULATORY_CARE_PROVIDER_SITE_OTHER): Payer: Medicare Other | Admitting: Family Medicine

## 2015-06-06 VITALS — BP 122/76 | HR 75 | Temp 98.6°F | Resp 14 | Ht 63.0 in | Wt 125.0 lb

## 2015-06-06 DIAGNOSIS — E039 Hypothyroidism, unspecified: Secondary | ICD-10-CM | POA: Diagnosis not present

## 2015-06-06 DIAGNOSIS — M8580 Other specified disorders of bone density and structure, unspecified site: Secondary | ICD-10-CM

## 2015-06-06 DIAGNOSIS — Z0000 Encounter for general adult medical examination without abnormal findings: Secondary | ICD-10-CM | POA: Diagnosis not present

## 2015-06-06 DIAGNOSIS — E785 Hyperlipidemia, unspecified: Secondary | ICD-10-CM

## 2015-06-06 DIAGNOSIS — Z113 Encounter for screening for infections with a predominantly sexual mode of transmission: Secondary | ICD-10-CM

## 2015-06-06 NOTE — Progress Notes (Signed)
Name: Samantha Booker   MRN: [id number]    DOB: Feb 04, 1951   Date:06/06/2015       Progress Note  Subjective  Chief Complaint  Chief Complaint  Patient presents with  . Annual Exam    HPI  Patient is here today for a Female Physical and Medicare Wellness Visit:  The patient has been in otherwise good general health and wishes to discuss chronic medical conditions as well.  Hypothyroidism: Patient presents for evaluation of thyroid function. Symptoms consist of denies fatigue, weight changes, heat/cold intolerance, bowel/skin changes or CVS symptoms. Symptoms have present for several years. The symptoms are mild. The problem has been stable. Previous thyroid studies include TSH, free thyroxine index and free thyroxine. The hypothyroidism is due to hypothyroidism.  Hyperlipidemia: Patient presents with hyperlipidemia.  She was tested due to hypothyroidism, on long term hormone replacement and family history and found to have high cholesterol panel 11/03/14. There is a family history of hyperlipidemia. There is not a family history of early ischemia heart disease.  She continues to use her Lipitor 20mg  one a day.   Past Medical History  Diagnosis Date  . Chicken pox   . Jaundice   . Hyperlipidemia   . Kidney stone   . Thyroid disease   . History of hepatitis B     Past Surgical History  Procedure Laterality Date  . Cholecystectomy  2011  . Appendectomy  1979  . Suburethral sling  2012    Dr. Audie Box  . Abdominal hysterectomy  1980    precancerous cells    Family History  Problem Relation Age of Onset  . Diabetes Mother   . Heart disease Mother     CABG 2007  . Hyperlipidemia Mother   . COPD Mother   . Hyperlipidemia Father   . Heart disease Father   . Cancer Maternal Grandmother     breast  . Cancer Maternal Aunt     breast  . Cancer Cousin     brain  . Cancer Cousin     melanoma    Social History   Social History  . Marital Status: Married    Spouse Name:  N/A  . Number of Children: 2  . Years of Education: N/A   Occupational History  . retired    Social History Main Topics  . Smoking status: Former Research scientist (life sciences)  . Smokeless tobacco: Not on file     Comment: patient quit in 1993  . Alcohol Use: No     Comment: socially  . Drug Use: No  . Sexual Activity:    Partners: Male   Other Topics Concern  . Not on file   Social History Narrative   Lives in [place]. Married. 2 children, 1 living in [place], 1 in [place]     Current outpatient prescriptions:  .  Ascorbic Acid (VITAMIN C) 1000 MG tablet, Take by mouth., Disp: , Rfl:  .  aspirin EC 81 MG tablet, Take by mouth., Disp: , Rfl:  .  atorvastatin (LIPITOR) 20 MG tablet, Take 1 tablet (20 mg total) by mouth daily., Disp: 30 tablet, Rfl: 5 .  B Complex Vitamins (VITAMIN-B COMPLEX) TABS, Take by mouth., Disp: , Rfl:  .  ERGOCALCIFEROL PO, Take by mouth., Disp: , Rfl:  .  estradiol (ESTRACE) 1 MG tablet, Take by mouth every other day. Taking 1/2 tablet, Disp: , Rfl:  .  levothyroxine (SYNTHROID, LEVOTHROID) 100 MCG tablet, , Disp: , Rfl:  .  meloxicam (  MOBIC) 15 MG tablet, Take by mouth., Disp: , Rfl:  .  metroNIDAZOLE (METROCREAM) 0.75 % cream, once daily. Facial cream, Disp: , Rfl:  .  Probiotic Product (PROBIOTIC PO), Take by mouth., Disp: , Rfl:   Allergies  Allergen Reactions  . Penicillins Other (See Comments)    Doesn't work     Fall Risk: Fall Risk  06/06/2015  Falls in the past year? No    Depression screen [facility] 2/9 06/06/2015 10/19/2014 11/24/2012  Decreased Interest 0 0 0  Down, Depressed, Hopeless 0 0 0  PHQ - 2 Score 0 0 0    Functional Status Survey: Is the patient deaf or have difficulty hearing?: No Does the patient have difficulty seeing, even when wearing glasses/contacts?: Yes Does the patient have difficulty concentrating, remembering, or making decisions?: No Does the patient have difficulty walking or climbing stairs?: No Does the patient have difficulty  dressing or bathing?: No Does the patient have difficulty doing errands alone such as visiting a doctor's office or shopping?: No  ROS  CONSTITUTIONAL: No significant weight changes, fever, chills, weakness or fatigue.  HEENT:  - Eyes: No visual changes.  - Ears: No auditory changes. No pain.  - Nose: No sneezing, congestion, runny nose. - Throat: No sore throat. No changes in swallowing. SKIN: No rash or itching.  CARDIOVASCULAR: No chest pain, chest pressure or chest discomfort. No palpitations or edema.  RESPIRATORY: No shortness of breath, cough or sputum.  GASTROINTESTINAL: No anorexia, nausea, vomiting. No changes in bowel habits. No abdominal pain or blood.  GENITOURINARY: No dysuria. No frequency. No discharge.  NEUROLOGICAL: No headache, dizziness, syncope, paralysis, ataxia, numbness or tingling in the extremities. No memory changes. No change in bowel or bladder control.  MUSCULOSKELETAL: No joint pain. No muscle pain. HEMATOLOGIC: No anemia, bleeding or bruising.  LYMPHATICS: No enlarged lymph nodes.  PSYCHIATRIC: No change in mood. No change in sleep pattern.  ENDOCRINOLOGIC: No reports of sweating, cold or heat intolerance. No polyuria or polydipsia.   Objective  Filed Vitals:   06/06/15 1350  BP: 122/76  Pulse: 75  Temp: 98.6 F (37 C)  TempSrc: Oral  Resp: 14  Height: 5\' 3"  (1.6 m)  Weight: 125 lb (56.7 kg)  SpO2: 95%   Body mass index is 22.15 kg/(m^2).  Physical Exam  Constitutional: Patient appears well-developed and well-nourished. In no distress.  HEENT:  - Head: Normocephalic and atraumatic. Did have right upper cheek facial lesion removed by derm. - Ears: Bilateral TMs gray, no erythema or effusion - Nose: Nasal mucosa moist - Mouth/Throat: Oropharynx is clear and moist. No tonsillar hypertrophy or erythema. No post nasal drainage.  - Eyes: Conjunctivae clear, EOM movements normal. PERRLA. No scleral icterus.  Neck: Normal range of motion. Neck  supple. No JVD present. No thyromegaly present.  Cardiovascular: Normal rate, regular rhythm and normal heart sounds.  No murmur heard.  Pulmonary/Chest: Effort normal and breath sounds normal. No respiratory distress. Musculoskeletal: Normal range of motion bilateral UE and LE, no joint effusions. Peripheral vascular: Bilateral LE no edema. Neurological: CN II-XII grossly intact with no focal deficits. Alert and oriented to person, place, and time. Coordination, balance, strength, speech and gait are normal.  Skin: Skin is warm and dry. No rash noted. No erythema.  Psychiatric: Patient has a normal mood and affect. Behavior is normal in office today. Judgment and thought content normal in office today.   Assessment & Plan  1. Medicare annual wellness visit, subsequent Functional ability/safety  issues: No Issues Hearing issues: Addressed  Activities of daily living: Discussed Home safety issues: No Issues  End Of Life Planning: Offered verbal information regarding advanced directives, healthcare power of attorney.  Preventative care, Health maintenance, Preventative health measures discussed.  Preventative screenings discussed today: lab work, colonoscopy, PAP, mammogram, DEXA.  Low Dose CT Chest recommended if Age 73-80 years, 30 pack-year currently smoking OR have quit w/in 15years.   Lifestyle risk factor issued reviewed: Diet, exercise, weight management, advised patient smoking is not healthy, nutrition/diet.  Preventative health measures discussed (5-10 year plan).  Reviewed and recommended vaccinations: - Pneumovax  - Prevnar  - Annual Influenza - Zostavax - Tdap   Depression screening: Done Fall risk screening: Done Discuss ADLs/IADLs: Done  Current medical providers: See HPI  Other health risk factors identified this visit: No other issues Cognitive impairment issues: None identified  All above discussed with patient. Appropriate education, counseling and  referral will be made based upon the above.    2. Hyperlipidemia LDL goal <100 Due to recheck FLP since starting statin therapy.  - Lipid panel  3. Hypothyroidism, acquired Due to recheck thyroid function tests.  - T3, free - T4, free - TSH  4. Screening for STD (sexually transmitted disease)  - Hepatitis C antibody  5. Osteopenia Reviewed DEXA scan report from [facility] done 05/18/2013

## 2015-06-15 ENCOUNTER — Other Ambulatory Visit: Payer: Self-pay | Admitting: Family Medicine

## 2015-06-15 DIAGNOSIS — E785 Hyperlipidemia, unspecified: Secondary | ICD-10-CM

## 2015-06-15 DIAGNOSIS — E039 Hypothyroidism, unspecified: Secondary | ICD-10-CM

## 2015-06-15 LAB — TSH: TSH: 0.626 u[IU]/mL (ref 0.450–4.500)

## 2015-06-15 LAB — LIPID PANEL
Chol/HDL Ratio: 3.5 ratio units (ref 0.0–4.4)
Cholesterol, Total: 218 mg/dL — ABNORMAL HIGH (ref 100–199)
HDL: 63 mg/dL (ref >39)
LDL Calculated: 120 mg/dL — ABNORMAL HIGH (ref 0–99)
Triglycerides: 174 mg/dL — ABNORMAL HIGH (ref 0–149)
VLDL CHOLESTEROL CAL: 35 mg/dL (ref 5–40)

## 2015-06-15 LAB — T4, FREE: FREE T4: 1.27 ng/dL (ref 0.82–1.77)

## 2015-06-15 LAB — HEPATITIS C ANTIBODY: Hep C Virus Ab: <0.1 s/co ratio (ref 0.0–0.9)

## 2015-06-15 LAB — T3, FREE: T3, Free: 2.7 pg/mL (ref 2.0–4.4)

## 2015-06-15 MED ORDER — LEVOTHYROXINE SODIUM 100 MCG PO TABS: 100.0000 ug | ORAL_TABLET | Freq: Every day | ORAL | Status: DC

## 2015-06-15 MED ORDER — ATORVASTATIN CALCIUM 40 MG PO TABS: 40.0000 mg | ORAL_TABLET | Freq: Every day | ORAL | Status: DC

## 2016-02-13 ENCOUNTER — Ambulatory Visit (INDEPENDENT_AMBULATORY_CARE_PROVIDER_SITE_OTHER): Payer: Medicare Other

## 2016-02-13 DIAGNOSIS — Z23 Encounter for immunization: Secondary | ICD-10-CM

## 2016-05-08 ENCOUNTER — Telehealth: Payer: Self-pay | Admitting: Family Medicine

## 2016-05-08 NOTE — Telephone Encounter (Signed)
errenous °

## 2016-06-09 ENCOUNTER — Ambulatory Visit (INDEPENDENT_AMBULATORY_CARE_PROVIDER_SITE_OTHER): Payer: Medicare HMO | Admitting: Family Medicine

## 2016-06-09 ENCOUNTER — Encounter: Payer: Self-pay | Admitting: Family Medicine

## 2016-06-09 ENCOUNTER — Other Ambulatory Visit: Payer: Self-pay | Admitting: Family Medicine

## 2016-06-09 VITALS — BP 120/74 | HR 79 | Temp 98.2°F | Resp 14 | Ht 62.5 in | Wt 123.6 lb

## 2016-06-09 DIAGNOSIS — Z23 Encounter for immunization: Secondary | ICD-10-CM

## 2016-06-09 DIAGNOSIS — Z8249 Family history of ischemic heart disease and other diseases of the circulatory system: Secondary | ICD-10-CM | POA: Diagnosis not present

## 2016-06-09 DIAGNOSIS — M8580 Other specified disorders of bone density and structure, unspecified site: Secondary | ICD-10-CM

## 2016-06-09 DIAGNOSIS — R7989 Other specified abnormal findings of blood chemistry: Secondary | ICD-10-CM

## 2016-06-09 DIAGNOSIS — Z0000 Encounter for general adult medical examination without abnormal findings: Secondary | ICD-10-CM | POA: Insufficient documentation

## 2016-06-09 DIAGNOSIS — E785 Hyperlipidemia, unspecified: Secondary | ICD-10-CM

## 2016-06-09 DIAGNOSIS — E039 Hypothyroidism, unspecified: Secondary | ICD-10-CM

## 2016-06-09 NOTE — Assessment & Plan Note (Signed)
Check DEXA; 3 servings of calcium a day; 1000 iu of vit D3 and fall precautions

## 2016-06-09 NOTE — Assessment & Plan Note (Signed)
USPSTF grade A and B recommendations reviewed with patient; age-appropriate recommendations, preventive care, screening tests, etc discussed and encouraged; healthy living encouraged; see AVS for patient education given to patient  

## 2016-06-09 NOTE — Assessment & Plan Note (Signed)
Check TSH; return to discuss

## 2016-06-09 NOTE — Assessment & Plan Note (Signed)
Check lipids 

## 2016-06-09 NOTE — Assessment & Plan Note (Signed)
Hx of low vit D

## 2016-06-09 NOTE — Assessment & Plan Note (Signed)
Screening US ordered.

## 2016-06-09 NOTE — Patient Instructions (Addendum)
Take 1,000 iu vitamin D3 once a day Please have fasting labs done soon Return in the next few weeks for a problem-based visit related to your cholesterol and thyroid  Health Maintenance, Female Introduction Adopting a healthy lifestyle and getting preventive care can go a long way to promote health and wellness. Talk with your health care provider about what schedule of regular examinations is right for you. This is a good chance for you to check in with your provider about disease prevention and staying healthy. In between checkups, there are plenty of things you can do on your own. Experts have done a lot of research about which lifestyle changes and preventive measures are most likely to keep you healthy. Ask your health care provider for more information. Weight and diet Eat a healthy diet  Be sure to include plenty of vegetables, fruits, low-fat dairy products, and lean protein.  Do not eat a lot of foods high in solid fats, added sugars, or salt.  Get regular exercise. This is one of the most important things you can do for your health.  Most adults should exercise for at least 150 minutes each week. The exercise should increase your heart rate and make you sweat (moderate-intensity exercise).  Most adults should also do strengthening exercises at least twice a week. This is in addition to the moderate-intensity exercise. Maintain a healthy weight  Body mass index (BMI) is a measurement that can be used to identify possible weight problems. It estimates body fat based on height and weight. Your health care provider can help determine your BMI and help you achieve or maintain a healthy weight.  For females 25 years of age and older:  A BMI below 18.5 is considered underweight.  A BMI of 18.5 to 24.9 is normal.  A BMI of 25 to 29.9 is considered overweight.  A BMI of 30 and above is considered obese. Watch levels of cholesterol and blood lipids  You should start having your  blood tested for lipids and cholesterol at 68 years of age, then have this test every 5 years.  You may need to have your cholesterol levels checked more often if:  Your lipid or cholesterol levels are high.  You are older than 68 years of age.  You are at high risk for heart disease. Cancer screening Lung Cancer  Lung cancer screening is recommended for adults 77-68 years old who are at high risk for lung cancer because of a history of smoking.  A yearly low-dose CT scan of the lungs is recommended for people who:  Currently smoke.  Have quit within the past 15 years.  Have at least a 30-pack-year history of smoking. A pack year is smoking an average of one pack of cigarettes a day for 1 year.  Yearly screening should continue until it has been 15 years since you quit.  Yearly screening should stop if you develop a health problem that would prevent you from having lung cancer treatment. Breast Cancer  Practice breast self-awareness. This means understanding how your breasts normally appear and feel.  It also means doing regular breast self-exams. Let your health care provider know about any changes, no matter how small.  If you are in your 20s or 30s, you should have a clinical breast exam (CBE) by a health care provider every 1-3 years as part of a regular health exam.  If you are 41 or older, have a CBE every year. Also consider having a breast X-ray (mammogram)  every year.  If you have a family history of breast cancer, talk to your health care provider about genetic screening.  If you are at high risk for breast cancer, talk to your health care provider about having an MRI and a mammogram every year.  Breast cancer gene (BRCA) assessment is recommended for women who have family members with BRCA-related cancers. BRCA-related cancers include:  Breast.  Ovarian.  Tubal.  Peritoneal cancers.  Results of the assessment will determine the need for genetic counseling  and BRCA1 and BRCA2 testing. Cervical Cancer  Your health care provider may recommend that you be screened regularly for cancer of the pelvic organs (ovaries, uterus, and vagina). This screening involves a pelvic examination, including checking for microscopic changes to the surface of your cervix (Pap test). You may be encouraged to have this screening done every 3 years, beginning at age 68.  For women ages 86-68, health care providers may recommend pelvic exams and Pap testing every 3 years, or they may recommend the Pap and pelvic exam, combined with testing for human papilloma virus (HPV), every 5 years. Some types of HPV increase your risk of cervical cancer. Testing for HPV may also be done on women of any age with unclear Pap test results.  Other health care providers may not recommend any screening for nonpregnant women who are considered low risk for pelvic cancer and who do not have symptoms. Ask your health care provider if a screening pelvic exam is right for you.  If you have had past treatment for cervical cancer or a condition that could lead to cancer, you need Pap tests and screening for cancer for at least 20 years after your treatment. If Pap tests have been discontinued, your risk factors (such as having a new sexual partner) need to be reassessed to determine if screening should resume. Some women have medical problems that increase the chance of getting cervical cancer. In these cases, your health care provider may recommend more frequent screening and Pap tests. Colorectal Cancer  This type of cancer can be detected and often prevented.  Routine colorectal cancer screening usually begins at 68 years of age and continues through 68 years of age.  Your health care provider may recommend screening at an earlier age if you have risk factors for colon cancer.  Your health care provider may also recommend using home test kits to check for hidden blood in the stool.  A small  camera at the end of a tube can be used to examine your colon directly (sigmoidoscopy or colonoscopy). This is done to check for the earliest forms of colorectal cancer.  Routine screening usually begins at age 68.  Direct examination of the colon should be repeated every 5-10 years through 68 years of age. However, you may need to be screened more often if early forms of precancerous polyps or small growths are found. Skin Cancer  Check your skin from head to toe regularly.  Tell your health care provider about any new moles or changes in moles, especially if there is a change in a mole's shape or color.  Also tell your health care provider if you have a mole that is larger than the size of a pencil eraser.  Always use sunscreen. Apply sunscreen liberally and repeatedly throughout the day.  Protect yourself by wearing long sleeves, pants, a wide-brimmed hat, and sunglasses whenever you are outside. Heart disease, diabetes, and high blood pressure  High blood pressure causes heart disease and  increases the risk of stroke. High blood pressure is more likely to develop in:  People who have blood pressure in the high end of the normal range (130-139/85-89 mm Hg).  People who are overweight or obese.  People who are African American.  If you are 76-34 years of age, have your blood pressure checked every 3-5 years. If you are 57 years of age or older, have your blood pressure checked every year. You should have your blood pressure measured twice-once when you are at a hospital or clinic, and once when you are not at a hospital or clinic. Record the average of the two measurements. To check your blood pressure when you are not at a hospital or clinic, you can use:  An automated blood pressure machine at a pharmacy.  A home blood pressure monitor.  If you are between 62 years and 68 years old, ask your health care provider if you should take aspirin to prevent strokes.  Have regular  diabetes screenings. This involves taking a blood sample to check your fasting blood sugar level.  If you are at a normal weight and have a low risk for diabetes, have this test once every three years after 68 years of age.  If you are overweight and have a high risk for diabetes, consider being tested at a younger age or more often. Preventing infection Hepatitis B  If you have a higher risk for hepatitis B, you should be screened for this virus. You are considered at high risk for hepatitis B if:  You were born in a country where hepatitis B is common. Ask your health care provider which countries are considered high risk.  Your parents were born in a high-risk country, and you have not been immunized against hepatitis B (hepatitis B vaccine).  You have HIV or AIDS.  You use needles to inject street drugs.  You live with someone who has hepatitis B.  You have had sex with someone who has hepatitis B.  You get hemodialysis treatment.  You take certain medicines for conditions, including cancer, organ transplantation, and autoimmune conditions. Hepatitis C  Blood testing is recommended for:  Everyone born from [birth year] through 1965.  Anyone with known risk factors for hepatitis C. Sexually transmitted infections (STIs)  You should be screened for sexually transmitted infections (STIs) including gonorrhea and chlamydia if:  You are sexually active and are younger than 68 years of age.  You are older than 68 years of age and your health care provider tells you that you are at risk for this type of infection.  Your sexual activity has changed since you were last screened and you are at an increased risk for chlamydia or gonorrhea. Ask your health care provider if you are at risk.  If you do not have HIV, but are at risk, it may be recommended that you take a prescription medicine daily to prevent HIV infection. This is called pre-exposure prophylaxis (PrEP). You are considered at  risk if:  You are sexually active and do not regularly use condoms or know the HIV status of your partner(s).  You take drugs by injection.  You are sexually active with a partner who has HIV. Talk with your health care provider about whether you are at high risk of being infected with HIV. If you choose to begin PrEP, you should first be tested for HIV. You should then be tested every 3 months for as long as you are taking PrEP. Pregnancy  If you are premenopausal and you may become pregnant, ask your health care provider about preconception counseling.  If you may become pregnant, take 400 to 800 micrograms (mcg) of folic acid every day.  If you want to prevent pregnancy, talk to your health care provider about birth control (contraception). Osteoporosis and menopause  Osteoporosis is a disease in which the bones lose minerals and strength with aging. This can result in serious bone fractures. Your risk for osteoporosis can be identified using a bone density scan.  If you are 23 years of age or older, or if you are at risk for osteoporosis and fractures, ask your health care provider if you should be screened.  Ask your health care provider whether you should take a calcium or vitamin D supplement to lower your risk for osteoporosis.  Menopause may have certain physical symptoms and risks.  Hormone replacement therapy may reduce some of these symptoms and risks. Talk to your health care provider about whether hormone replacement therapy is right for you. Follow these instructions at home:  Schedule regular health, dental, and eye exams.  Stay current with your immunizations.  Do not use any tobacco products including cigarettes, chewing tobacco, or electronic cigarettes.  If you are pregnant, do not drink alcohol.  If you are breastfeeding, limit how much and how often you drink alcohol.  Limit alcohol intake to no more than 1 drink per day for nonpregnant women. One drink  equals 12 ounces of beer, 5 ounces of wine, or 1 ounces of hard liquor.  Do not use street drugs.  Do not share needles.  Ask your health care provider for help if you need support or information about quitting drugs.  Tell your health care provider if you often feel depressed.  Tell your health care provider if you have ever been abused or do not feel safe at home. This information is not intended to replace advice given to you by your health care provider. Make sure you discuss any questions you have with your health care provider. Document Released: 10/28/2010 Document Revised: 09/20/2015 Document Reviewed: 01/16/2015  2017 Elsevier

## 2016-06-09 NOTE — Progress Notes (Signed)
Patient ID: Samantha Booker, female   DOB: 03/17/1951, 65 y.o.   MRN: [id number]   Subjective:   Samantha Booker is a 65 y.o. female here for a complete physical exam  Interim issues since last visit: she is having something slide down and wants that checked; no heaviness or pressure or urinary complaints  Chief Complaint  Patient presents with  . Annual Exam   USPSTF grade A and B recommendations Alcohol: no Depression:  Depression screen PHQ 2/9 06/09/2016 06/06/2015 10/19/2014 11/24/2012  Decreased Interest 0 0 0 0  Down, Depressed, Hopeless 0 0 0 0  PHQ - 2 Score 0 0 0 0   Hypertension: excellent Obesity: no Tobacco use: quit 1993 HIV, hep B, hep C: hep C testing is done; has hep B in 1971; no follow-up needed STD testing and prevention (chl/gon/syphilis): not interested Lipids: check when she returns fasting Glucose: not fasting (will return) Colorectal cancer: estimated 2010; next due 2020 per patient Breast cancer: UTD and already scheduled BRCA gene screening: breast cancer (grandmother and great aunt and maternal aunt) Intimate partner violence: no abuse Cervical cancer screening: s/p hysterectomy, cervix is gone Lung cancer: quit 1993 Osteoporosis: done by primary care doctor at [facility]; Jan 2015 Fall prevention/vitamin D: taking vit D AAA: positive AAA (mother) Aspirin: taking one every day Diet: pretty good eater Exercise: was doing regular activity, but will get back to walking; not much with arms; sees Chris Gaines for ortho; shoulders and knee Skin cancer: nothing worrisome  Past Medical History:  Diagnosis Date  . Chicken pox   . Foliclar lymph grade III, unsp, nodes of head, face, and nk (HCC)   . History of hepatitis B   . Hyperlipidemia   . Jaundice   . Kidney stone   . Thyroid disease    Past Surgical History:  Procedure Laterality Date  . ABDOMINAL HYSTERECTOMY  1980   precancerous cells  . APPENDECTOMY  1979  . CHOLECYSTECTOMY  2011   . suburethral sling  2012   Dr. O'Neal   Family History  Problem Relation Age of Onset  . Diabetes Mother   . Heart disease Mother     CABG 2007  . Hyperlipidemia Mother   . COPD Mother   . Hyperlipidemia Father   . Heart disease Father   . Cancer Maternal Grandmother     breast  . Cancer Maternal Aunt     breast  . Cancer Cousin     brain  . Cancer Cousin     melanoma   Social History  Substance Use Topics  . Smoking status: Former Smoker  . Smokeless tobacco: Never Used     Comment: patient quit in 1993  . Alcohol use No     Comment: socially   Review of Systems  Constitutional: Negative for unexpected weight change.  HENT: Negative for hearing loss.   Eyes: Negative for visual disturbance.  Respiratory: Negative for shortness of breath and wheezing.   Cardiovascular: Negative for chest pain.  Gastrointestinal: Negative for blood in stool.  Endocrine: Negative for polydipsia and polyuria.  Genitourinary: Negative for hematuria.  Musculoskeletal: Positive for arthralgias (shoulders and right knee, sees Chris Gaines).  Skin: Negative for wound.  Allergic/Immunologic: Negative for food allergies.  Neurological: Negative for tremors.  Hematological: Negative for adenopathy.  Psychiatric/Behavioral: Negative for dysphoric mood.    Objective:   Vitals:   06/09/16 0827  BP: 120/74  Pulse: 79  Resp: 14  Temp: 98.2 F (  36.8 C)  TempSrc: Oral  SpO2: 98%  Weight: 123 lb 9 oz (56 kg)  Height: 5' 2.5" (1.588 m)   Body mass index is 22.24 kg/m. Wt Readings from Last 3 Encounters:  06/09/16 123 lb 9 oz (56 kg)  06/06/15 125 lb (56.7 kg)  10/19/14 124 lb 1.6 oz (56.3 kg)   Physical Exam  Constitutional: She appears well-developed and well-nourished.  HENT:  Head: Normocephalic and atraumatic.  Right Ear: Hearing, tympanic membrane, external ear and ear canal normal.  Left Ear: Hearing, tympanic membrane, external ear and ear canal normal.  Eyes:  Conjunctivae and EOM are normal. Right eye exhibits no hordeolum. Left eye exhibits no hordeolum. No scleral icterus.  Neck: Carotid bruit is not present. No thyromegaly present.  Cardiovascular: Normal rate, regular rhythm, S1 normal, S2 normal and normal heart sounds.   No extrasystoles are present.  Pulmonary/Chest: Effort normal and breath sounds normal. No respiratory distress. Right breast exhibits no inverted nipple, no mass, no nipple discharge, no skin change and no tenderness. Left breast exhibits no inverted nipple, no mass, no nipple discharge, no skin change and no tenderness. Breasts are symmetrical.  Abdominal: Soft. Normal appearance and bowel sounds are normal. She exhibits no distension, no abdominal bruit, no pulsatile midline mass and no mass. There is no hepatosplenomegaly. There is no tenderness. No hernia.  Genitourinary: There is no rash on the right labia. There is no rash on the left labia. No erythema or bleeding in the vagina. No signs of injury around the vagina. No vaginal discharge found.  Genitourinary Comments: Vaginal/bladder tissue prolapsed to introitus, not beyond; no rectocoele  Musculoskeletal: Normal range of motion. She exhibits no edema.  Lymphadenopathy:       Head (right side): No submandibular adenopathy present.       Head (left side): No submandibular adenopathy present.    She has no cervical adenopathy.    She has no axillary adenopathy.  Neurological: She is alert. She displays no tremor. No cranial nerve deficit. She exhibits normal muscle tone. Gait normal.  Reflex Scores:      Patellar reflexes are 2+ on the right side and 2+ on the left side. Skin: Skin is warm and dry. No bruising and no ecchymosis noted. No cyanosis. No pallor.  Psychiatric: Her speech is normal and behavior is normal. Thought content normal. Her mood appears not anxious. She does not exhibit a depressed mood.   Assessment/Plan:   Problem List Items Addressed This Visit       Endocrine   Hypothyroidism, acquired    Check TSH; return to discuss      Relevant Orders   TSH     Musculoskeletal and Integument   Osteopenia    Check DEXA; 3 servings of calcium a day; 1000 iu of vit D3 and fall precautions      Relevant Orders   DG Bone Density     Other   Preventative health care - Primary    USPSTF grade A and B recommendations reviewed with patient; age-appropriate recommendations, preventive care, screening tests, etc discussed and encouraged; healthy living encouraged; see AVS for patient education given to patient       Relevant Orders   Comprehensive metabolic panel   CBC with Differential/Platelet   Low serum vitamin D    Hx of low vit D      Relevant Orders   VITAMIN D 25 Hydroxy (Vit-D Deficiency, Fractures)   Hyperlipidemia LDL goal <100  Check lipids      Relevant Orders   Lipid panel   Family history of abdominal aortic aneurysm (AAA)    Order retroperitoneal US to look for AAA      Relevant Orders   US Retroperitoneal Comp    Other Visit Diagnoses    Need for vaccination with 13-polyvalent pneumococcal conjugate vaccine       Relevant Orders   Pneumococcal conjugate vaccine 13-valent (Completed)      Meds ordered this encounter  Medications  . Biotin 5000 MCG CAPS    Sig: Take by mouth 1 day or 1 dose.   Orders Placed This Encounter  Procedures  . US Retroperitoneal Comp    Order Specific Question:   Reason for Exam (SYMPTOM  OR DIAGNOSIS REQUIRED)    Answer:   mother had AAA    Order Specific Question:   Preferred imaging location?    Answer:   [facility]  . DG Bone Density    Order Specific Question:   Reason for Exam (SYMPTOM  OR DIAGNOSIS REQUIRED)    Answer:   osteopenia on prior imaging    Order Specific Question:   Preferred imaging location?    Answer:   [facility]  . Pneumococcal conjugate vaccine 13-valent  . TSH  . Comprehensive metabolic panel  . CBC with Differential/Platelet  .  Lipid panel  . VITAMIN D 25 Hydroxy (Vit-D Deficiency, Fractures)   Follow up plan: Return in about 1 year (around 06/09/2017) for complete physical; next few weeks for problem-based visit.  An After Visit Summary was printed and given to the patient. 

## 2016-06-09 NOTE — Assessment & Plan Note (Signed)
Order retroperitoneal [place] to look for AAA

## 2016-06-09 NOTE — Progress Notes (Signed)
AAA screen ordered.

## 2016-06-10 DIAGNOSIS — E785 Hyperlipidemia, unspecified: Secondary | ICD-10-CM | POA: Diagnosis not present

## 2016-06-10 DIAGNOSIS — E039 Hypothyroidism, unspecified: Secondary | ICD-10-CM | POA: Diagnosis not present

## 2016-06-10 DIAGNOSIS — Z0000 Encounter for general adult medical examination without abnormal findings: Secondary | ICD-10-CM | POA: Diagnosis not present

## 2016-06-10 DIAGNOSIS — R7989 Other specified abnormal findings of blood chemistry: Secondary | ICD-10-CM | POA: Diagnosis not present

## 2016-06-11 ENCOUNTER — Other Ambulatory Visit: Payer: Self-pay | Admitting: Family Medicine

## 2016-06-11 DIAGNOSIS — E039 Hypothyroidism, unspecified: Secondary | ICD-10-CM

## 2016-06-11 LAB — CBC WITH DIFFERENTIAL/PLATELET
BASOS ABS: 0.1 10*3/uL (ref 0.0–0.2)
Basos: 1 %
EOS (ABSOLUTE): 0.3 10*3/uL (ref 0.0–0.4)
EOS: 4 %
HEMATOCRIT: 40.7 % (ref 34.0–46.6)
Hemoglobin: 13.6 g/dL (ref 11.1–15.9)
IMMATURE GRANULOCYTES: 0 %
Immature Grans (Abs): 0 10*3/uL (ref 0.0–0.1)
Lymphocytes Absolute: 2.2 10*3/uL (ref 0.7–3.1)
Lymphs: 28 %
MCH: 30.8 pg (ref 26.6–33.0)
MCHC: 33.4 g/dL (ref 31.5–35.7)
MCV: 92 fL (ref 79–97)
Monocytes Absolute: 0.7 10*3/uL (ref 0.1–0.9)
Monocytes: 9 %
NEUTROS PCT: 58 %
Neutrophils Absolute: 4.6 10*3/uL (ref 1.4–7.0)
PLATELETS: 238 10*3/uL (ref 150–379)
RBC: 4.41 x10E6/uL (ref 3.77–5.28)
RDW: 12.7 % (ref 12.3–15.4)
WBC: 8 10*3/uL (ref 3.4–10.8)

## 2016-06-11 LAB — COMPREHENSIVE METABOLIC PANEL
ALK PHOS: 47 IU/L (ref 39–117)
ALT: 24 IU/L (ref 0–32)
AST: 22 IU/L (ref 0–40)
Albumin/Globulin Ratio: 1.8 (ref 1.2–2.2)
Albumin: 4.1 g/dL (ref 3.6–4.8)
BILIRUBIN TOTAL: 0.3 mg/dL (ref 0.0–1.2)
BUN / CREAT RATIO: 18 (ref 12–28)
BUN: 11 mg/dL (ref 8–27)
CHLORIDE: 105 mmol/L (ref 96–106)
CO2: 26 mmol/L (ref 18–29)
CREATININE: 0.62 mg/dL (ref 0.57–1.00)
Calcium: 9 mg/dL (ref 8.7–10.3)
GFR calc Af Amer: 108 mL/min/{1.73_m2} (ref >59)
GFR calc non Af Amer: 94 mL/min/{1.73_m2} (ref >59)
GLOBULIN, TOTAL: 2.3 g/dL (ref 1.5–4.5)
GLUCOSE: 86 mg/dL (ref 65–99)
Potassium: 4.5 mmol/L (ref 3.5–5.2)
SODIUM: 144 mmol/L (ref 134–144)
Total Protein: 6.4 g/dL (ref 6.0–8.5)

## 2016-06-11 LAB — LIPID PANEL
CHOLESTEROL TOTAL: 202 mg/dL — AB (ref 100–199)
Chol/HDL Ratio: 4.1 ratio units (ref 0.0–4.4)
HDL: 49 mg/dL (ref >39)
LDL Calculated: 124 mg/dL — ABNORMAL HIGH (ref 0–99)
TRIGLYCERIDES: 145 mg/dL (ref 0–149)
VLDL Cholesterol Cal: 29 mg/dL (ref 5–40)

## 2016-06-11 LAB — TSH: TSH: 0.367 u[IU]/mL — ABNORMAL LOW (ref 0.450–4.500)

## 2016-06-11 LAB — VITAMIN D 25 HYDROXY (VIT D DEFICIENCY, FRACTURES): Vit D, 25-Hydroxy: 87.7 ng/mL (ref 30.0–100.0)

## 2016-06-11 MED ORDER — LEVOTHYROXINE SODIUM 88 MCG PO TABS: 88.0000 ug | ORAL_TABLET | Freq: Every day | ORAL | 1 refills | Status: DC

## 2016-06-11 NOTE — Assessment & Plan Note (Signed)
Check TSH in 6-8 weeks after dose adjustment

## 2016-06-11 NOTE — Progress Notes (Signed)
Change dose of thyroid med, recheck tsh in 6-8 weeks

## 2016-06-12 ENCOUNTER — Telehealth: Payer: Self-pay

## 2016-06-12 NOTE — Telephone Encounter (Signed)
Ah, I thought the other one was canceled by staff who asked me to enter the other order. Here's what I got: --------------------------------------- From: Laurita Quint  Sent: 06/09/2016  3:03 PM  To: April H Pait, Arnetha Courser, MD  Subject: Corrected Order Needed              Hello,   This patient is on the [facility] Work Queue for [place] Retroperitoneal Complete. Patient has not been scanned before and does qualify for the Medicare Screening. Please enter order onto [facility] for US Aorta Screening [id number]).   Thank you so much for your help with this.  -----------------------------------------  So they want her to just have the US Aorta Screening [id number]; the other one can be canceled Thank you!

## 2016-06-12 NOTE — Telephone Encounter (Signed)
I called this patient to inform her that she has been scheduled to have her ultrasound abdomen (AAA) on 06/16/16 @ 9am but she stated that she has already been scheduled to have an abdominal u/s on 06/23/16 and did not need both. She said that she cannot do the 19th b/c she has a dental appt and wanted them to do whatever they have to on the 26th.  She then stated that she has an appt with [facility] for her mammogram on 06/17/16 and would like to have her bone density at the same time.  Please advise

## 2016-06-12 NOTE — Telephone Encounter (Signed)
I'm not sure what I can do; it sounds like a scheduling issue Please let me know if you need me to do something

## 2016-06-13 NOTE — Telephone Encounter (Signed)
Order has been corrected and patient has been informed via voicemail.

## 2016-06-16 ENCOUNTER — Ambulatory Visit: Payer: Self-pay

## 2016-06-17 DIAGNOSIS — Z1231 Encounter for screening mammogram for malignant neoplasm of breast: Secondary | ICD-10-CM | POA: Diagnosis not present

## 2016-06-23 ENCOUNTER — Ambulatory Visit
Admission: RE | Admit: 2016-06-23 | Discharge: 2016-06-23 | Disposition: A | Discharge status: Home / Self Care | Payer: Medicare HMO | Source: Ambulatory Visit | Attending: Family Medicine | Admitting: Family Medicine

## 2016-06-23 DIAGNOSIS — Z8489 Family history of other specified conditions: Secondary | ICD-10-CM | POA: Insufficient documentation

## 2016-06-23 DIAGNOSIS — I714 Abdominal aortic aneurysm, without rupture: Secondary | ICD-10-CM | POA: Diagnosis not present

## 2016-06-23 DIAGNOSIS — Z8249 Family history of ischemic heart disease and other diseases of the circulatory system: Secondary | ICD-10-CM | POA: Insufficient documentation

## 2016-06-23 DIAGNOSIS — Z136 Encounter for screening for cardiovascular disorders: Secondary | ICD-10-CM | POA: Insufficient documentation

## 2016-06-23 DIAGNOSIS — Z1389 Encounter for screening for other disorder: Secondary | ICD-10-CM | POA: Diagnosis not present

## 2016-06-23 DIAGNOSIS — I700 Atherosclerosis of aorta: Secondary | ICD-10-CM | POA: Insufficient documentation

## 2016-06-25 ENCOUNTER — Encounter: Payer: Self-pay | Admitting: Family Medicine

## 2016-06-25 DIAGNOSIS — I700 Atherosclerosis of aorta: Secondary | ICD-10-CM

## 2016-06-25 HISTORY — DX: Atherosclerosis of aorta: I70.0

## 2016-07-21 ENCOUNTER — Other Ambulatory Visit: Payer: Self-pay

## 2016-07-21 DIAGNOSIS — E785 Hyperlipidemia, unspecified: Secondary | ICD-10-CM

## 2016-07-22 MED ORDER — ATORVASTATIN CALCIUM 40 MG PO TABS: 40.0000 mg | ORAL_TABLET | Freq: Every day | ORAL | 2 refills | Status: DC

## 2016-07-22 NOTE — Telephone Encounter (Signed)
Sgpt and lipids from Feb 2018 reviewed; Rx approved

## 2016-07-28 ENCOUNTER — Other Ambulatory Visit: Payer: Self-pay

## 2016-07-28 MED ORDER — LEVOTHYROXINE SODIUM 88 MCG PO TABS: 88.0000 ug | ORAL_TABLET | Freq: Every day | ORAL | 0 refills | Status: DC

## 2016-07-28 NOTE — Telephone Encounter (Signed)
Left voice mail

## 2016-07-28 NOTE — Telephone Encounter (Signed)
Please give patient a gentle reminder to have her labs done; we need to make sure the dose is correct; thank you I'll send in 7 day supply

## 2016-07-29 ENCOUNTER — Other Ambulatory Visit: Payer: Self-pay

## 2016-07-29 DIAGNOSIS — E039 Hypothyroidism, unspecified: Secondary | ICD-10-CM

## 2016-07-29 LAB — TSH: TSH: 1.13 mIU/L

## 2016-07-30 ENCOUNTER — Other Ambulatory Visit: Payer: Self-pay | Admitting: Family Medicine

## 2016-07-30 MED ORDER — LEVOTHYROXINE SODIUM 88 MCG PO TABS: 88.0000 ug | ORAL_TABLET | Freq: Every day | ORAL | 11 refills | Status: DC

## 2016-07-30 NOTE — Progress Notes (Signed)
Normal TSH Lab Results  Component Value Date   TSH 1.13 07/29/2016   Refills sent for one year

## 2016-08-26 DIAGNOSIS — H25813 Combined forms of age-related cataract, bilateral: Secondary | ICD-10-CM | POA: Diagnosis not present

## 2016-10-02 ENCOUNTER — Other Ambulatory Visit: Payer: Self-pay | Admitting: Family Medicine

## 2016-10-02 DIAGNOSIS — E785 Hyperlipidemia, unspecified: Secondary | ICD-10-CM

## 2016-10-02 NOTE — Telephone Encounter (Signed)
Last lipids and sgpt reviewed; Rx approved 

## 2016-12-22 DIAGNOSIS — R69 Illness, unspecified: Secondary | ICD-10-CM | POA: Diagnosis not present

## 2016-12-26 DIAGNOSIS — M1711 Unilateral primary osteoarthritis, right knee: Secondary | ICD-10-CM | POA: Diagnosis not present

## 2017-01-26 ENCOUNTER — Other Ambulatory Visit: Payer: Self-pay

## 2017-01-26 NOTE — Telephone Encounter (Signed)
Patient has requested HRT (estradiol) However, I do not see a recent mammogram on the chart Please contact her; verify if done, update HM list Back to me for refill when mammo verified

## 2017-01-28 ENCOUNTER — Encounter: Payer: Self-pay | Admitting: Family Medicine

## 2017-01-29 NOTE — Telephone Encounter (Signed)
I left message for patient; I'd like to verify exactly how much she is taking right now (I think it is half of a pill every other day); I'd like to talk to her to find out if she's willing to decrease this dose; risks associated with long-term use [I'm inclined to offer 0.3 mg strength every other day]

## 2017-01-29 NOTE — Telephone Encounter (Signed)
Spoke with Patient regarding her mammogram. She states she had it done at [facility] in [place] of last year. Looked at care everywhere and saw that it was done on 06/17/2016; Updated in HM.

## 2017-01-30 MED ORDER — ESTRADIOL 1 MG PO TABS: 0.5000 mg | ORAL_TABLET | ORAL | 1 refills | Status: DC

## 2017-01-30 NOTE — Telephone Encounter (Signed)
Thank you for speaking with her I sent in refill, will keep trying to get her to wean

## 2017-01-30 NOTE — Telephone Encounter (Signed)
Spoke with patient on this morning regarding the message that Dr. Sanda Klein left for her on 01/26/17.  Patient verified that she is taking a half of pill every other day.  Patient also stated that she does not want to decrease the dosage at this time.  However she is willing to discuss decreasing at her visit with Dr. Sanda Klein in Feb. 2019.  Patient stated that she feels as though she is still going through the changes (hot flashes, hair loss, fatigue).

## 2017-02-04 ENCOUNTER — Ambulatory Visit (INDEPENDENT_AMBULATORY_CARE_PROVIDER_SITE_OTHER): Payer: Medicare HMO

## 2017-02-04 DIAGNOSIS — Z23 Encounter for immunization: Secondary | ICD-10-CM

## 2017-04-28 HISTORY — PX: CATARACT EXTRACTION: SUR2

## 2017-06-04 DIAGNOSIS — H25813 Combined forms of age-related cataract, bilateral: Secondary | ICD-10-CM | POA: Diagnosis not present

## 2017-06-11 ENCOUNTER — Encounter: Payer: Medicare HMO | Admitting: Family Medicine

## 2017-06-23 DIAGNOSIS — Z1231 Encounter for screening mammogram for malignant neoplasm of breast: Secondary | ICD-10-CM | POA: Diagnosis not present

## 2017-06-23 LAB — HM MAMMOGRAPHY

## 2017-06-24 DIAGNOSIS — R69 Illness, unspecified: Secondary | ICD-10-CM | POA: Diagnosis not present

## 2017-06-25 DIAGNOSIS — H43813 Vitreous degeneration, bilateral: Secondary | ICD-10-CM | POA: Diagnosis not present

## 2017-07-10 ENCOUNTER — Encounter: Payer: Self-pay | Admitting: Family Medicine

## 2017-07-10 ENCOUNTER — Ambulatory Visit (INDEPENDENT_AMBULATORY_CARE_PROVIDER_SITE_OTHER): Payer: Medicare HMO | Admitting: Family Medicine

## 2017-07-10 VITALS — BP 144/80 | HR 75 | Temp 98.1°F | Resp 14 | Ht 63.0 in | Wt 127.6 lb

## 2017-07-10 DIAGNOSIS — M8580 Other specified disorders of bone density and structure, unspecified site: Secondary | ICD-10-CM | POA: Diagnosis not present

## 2017-07-10 DIAGNOSIS — M545 Low back pain: Secondary | ICD-10-CM | POA: Diagnosis not present

## 2017-07-10 DIAGNOSIS — Z23 Encounter for immunization: Secondary | ICD-10-CM

## 2017-07-10 DIAGNOSIS — Z0001 Encounter for general adult medical examination with abnormal findings: Secondary | ICD-10-CM

## 2017-07-10 DIAGNOSIS — N951 Menopausal and female climacteric states: Secondary | ICD-10-CM | POA: Diagnosis not present

## 2017-07-10 DIAGNOSIS — Z79890 Hormone replacement therapy (postmenopausal): Secondary | ICD-10-CM

## 2017-07-10 DIAGNOSIS — Z8249 Family history of ischemic heart disease and other diseases of the circulatory system: Secondary | ICD-10-CM

## 2017-07-10 DIAGNOSIS — Z5181 Encounter for therapeutic drug level monitoring: Secondary | ICD-10-CM | POA: Diagnosis not present

## 2017-07-10 DIAGNOSIS — R7989 Other specified abnormal findings of blood chemistry: Secondary | ICD-10-CM | POA: Diagnosis not present

## 2017-07-10 DIAGNOSIS — E039 Hypothyroidism, unspecified: Secondary | ICD-10-CM

## 2017-07-10 DIAGNOSIS — E785 Hyperlipidemia, unspecified: Secondary | ICD-10-CM | POA: Diagnosis not present

## 2017-07-10 DIAGNOSIS — I700 Atherosclerosis of aorta: Secondary | ICD-10-CM

## 2017-07-10 DIAGNOSIS — Z0000 Encounter for general adult medical examination without abnormal findings: Secondary | ICD-10-CM | POA: Insufficient documentation

## 2017-07-10 DIAGNOSIS — Z789 Other specified health status: Secondary | ICD-10-CM | POA: Insufficient documentation

## 2017-07-10 MED ORDER — METRONIDAZOLE 0.75 % EX CREA: TOPICAL_CREAM | Freq: Every day | CUTANEOUS | 11 refills | Status: DC

## 2017-07-10 MED ORDER — ATORVASTATIN CALCIUM 40 MG PO TABS: 40.0000 mg | ORAL_TABLET | Freq: Every day | ORAL | 1 refills | Status: DC

## 2017-07-10 MED ORDER — ESTROGENS CONJUGATED 0.45 MG PO TABS: 0.4500 mg | ORAL_TABLET | Freq: Every day | ORAL | 0 refills | Status: DC

## 2017-07-10 NOTE — Assessment & Plan Note (Signed)
Already screened

## 2017-07-10 NOTE — Progress Notes (Signed)
Patient: Samantha Booker, Female    DOB: 12-Feb-1951, 67 y.o.   MRN: [id number]  Visit Date: 07/10/2017  Today's Provider: Enid Derry, MD   Chief Complaint  Patient presents with  . Medicare Wellness    Subjective:   Samantha Booker is a 67 y.o. female who presents today for her Subsequent Annual Wellness Visit.  Hypothyroidism Lab Results  Component Value Date   TSH 1.13 07/29/2016   Arthritis, osteoarthritis; knee and shoulders; PRN meloxicam  She had some premarin left over from a few years ago; she was about to run out of estradiol and then started back on Premarin; she is aware of increased of stroke, heart attack and breast cancer  She is also on face cream; metronidazole cream; they turned her loose  Her back has been bothering her off and on; she used to take flexeril, explained age warning  USPSTF grade A and B recommendations Depression:  Depression screen [facility] 2/9 07/10/2017 06/09/2016 06/06/2015 10/19/2014 11/24/2012  Decreased Interest 0 0 0 0 0  Down, Depressed, Hopeless 0 0 0 0 0  PHQ - 2 Score 0 0 0 0 0   Hypertension: usually well-controlled; pt decline recheck; keep eye on it at home, call me if over 140/90 BP Readings from Last 3 Encounters:  07/10/17 (!) 144/80  06/09/16 120/74  06/06/15 122/76   Obesity: Wt Readings from Last 3 Encounters:  07/10/17 127 lb 9.6 oz (57.9 kg)  06/09/16 123 lb 9 oz (56 kg)  06/06/15 125 lb (56.7 kg)   BMI Readings from Last 3 Encounters:  07/10/17 22.60 kg/m  06/09/16 22.24 kg/m  06/06/15 22.14 kg/m    Skin cancer: nothing worrisome Lung cancer:  Former smoker; quit 1993 Breast cancer: no lumps or bumps; no hx of breast biopsy; age at first delivery 43; mammo UTD Colorectal cancer: in 2010; 10 year pass; no fam hx of colon cancer Cervical cancer screening: s/p hyst BRCA gene screening: family hx of breast and/or ovarian cancer and/or metastatic prostate cancer? Breast cancer (maternal GM and great aunt), no  others HIV, hep B, hep C: had hepatitis B, they said no follow-up; hospitalized at age 73 for 30 days; no recommendations made and feels fine otherwise; not interested in others STD testing and prevention (chl/gon/syphilis): not interested Intimate partner violence: no abuse Contraception: n/a Osteoporosis: done in 2015; ordered Fall prevention/vitamin D: discussed, taking vi D 1000 iu daily Immunizations: see AVS Diet: getting 3 servings of calcium; lots of fruits of veggies; room for improvement with fatty meats Exercise: getting enough, except when weather does not allow Alcohol: very little  Tobacco use: quit remotely AAA: mother had AAA; had screen already last year Aspirin: taking daily Glucose:  Check today Glucose  Date Value Ref Range Status  06/10/2016 86 65 - 99 mg/dL Final  11/03/2014 94 65 - 99 mg/dL Final   Glucose, Bld  Date Value Ref Range Status  10/08/2012 88 70 - 99 mg/dL Final  05/03/2012 108 (H) 70 - 99 mg/dL Final  01/27/2012 88 70 - 99 mg/dL Final   Lipids:  Lab Results  Component Value Date   CHOL 202 (H) 06/10/2016   CHOL 218 (H) 06/14/2015   CHOL 311 (H) 11/03/2014   Lab Results  Component Value Date   HDL 49 06/10/2016   HDL 63 06/14/2015   HDL 59 11/03/2014   Lab Results  Component Value Date   LDLCALC 124 (H) 06/10/2016   LDLCALC 120 (H) 06/14/2015  LDLCALC 205 (H) 11/03/2014   Lab Results  Component Value Date   TRIG 145 06/10/2016   TRIG 174 (H) 06/14/2015   TRIG 234 (H) 11/03/2014   Lab Results  Component Value Date   CHOLHDL 4.1 06/10/2016   CHOLHDL 3.5 06/14/2015   CHOLHDL 5.3 (H) 11/03/2014   No results found for: LDLDIRECT   Review of Systems  HENT:       Ears have been itching  Skin:       Follicular mucinosis; rosacea    Past Medical History:  Diagnosis Date  . Atherosclerosis of aorta ([place]) 06/25/2016   Imaging Feb 2018  . Chicken pox   . 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 History of hepatitis B   . Hyperlipidemia   . Jaundice   . Kidney stone   . Thyroid disease     Past Surgical History:  Procedure Laterality Date  . ABDOMINAL HYSTERECTOMY  1980   precancerous cells  . APPENDECTOMY  1979  . CHOLECYSTECTOMY  2011  . suburethral sling  2012   Dr. Audie Box    Family History  Problem Relation Age of Onset  . Diabetes Mother   . Heart disease Mother        CABG 2007  . Hyperlipidemia Mother   . COPD Mother   . Hyperlipidemia Father   . Heart disease Father   . Cancer Maternal Grandmother        breast  . Cancer Maternal Aunt        breast  . Parkinson's disease Brother   . Cancer Cousin        brain  . Cancer Cousin        melanoma    Social History   Tobacco Use  . Smoking status: Former Research scientist (life sciences)  . Smokeless tobacco: Never Used  . Tobacco comment: patient quit in 1993  Substance Use Topics  . Alcohol use: Yes    Alcohol/week: 0.0 oz    Comment: socially  . Drug use: No    Outpatient Encounter Medications as of 07/10/2017  Medication Sig Note  . aspirin EC 81 MG tablet Take 1 tablet (81 mg total) by mouth daily.   Marland Kitchen atorvastatin (LIPITOR) 40 MG tablet Take 1 tablet (40 mg total) by mouth at bedtime.   . B Complex Vitamins (VITAMIN-B COMPLEX) TABS Take 1 tablet by mouth daily.  10/19/2014: Received from: [place]  . Biotin 5000 MCG CAPS Take 1 capsule by mouth 1 day or 1 dose.    . estrogens, conjugated, (PREMARIN) 0.45 MG tablet Take 1 tablet (0.45 mg total) by mouth daily.   Marland Kitchen levothyroxine (SYNTHROID) 88 MCG tablet Take 1 tablet (88 mcg total) by mouth daily before breakfast.   . meloxicam (MOBIC) 15 MG tablet Take 1 tablet (15 mg total) by mouth daily as needed.   . metroNIDAZOLE (METROCREAM) 0.75 % cream Apply topically daily.   . Multiple Vitamins-Minerals (PRESERVISION AREDS PO) Take 1 tablet by mouth daily.    . Probiotic Product (PROBIOTIC PO) Take 1 tablet by mouth daily.  10/19/2014: Received from:  [place]  . [DISCONTINUED] aspirin EC 81 MG tablet Take by mouth. 10/19/2014: Received from: [place]  . [DISCONTINUED] atorvastatin (LIPITOR) 40 MG tablet Take 1 tablet (40 mg total) by mouth at bedtime.   . [DISCONTINUED] ERGOCALCIFEROL PO Take by mouth. 10/19/2014: Received from: [place]  . [  DISCONTINUED] estradiol (ESTRACE) 1 MG tablet Take 0.5 tablets (0.5 mg total) by mouth every other day. (Patient taking differently: Take 0.5 mg by mouth daily. )   . [DISCONTINUED] meloxicam (MOBIC) 15 MG tablet Take 15 mg by mouth as needed.  10/19/2014: Received from: [place]  . [DISCONTINUED] metroNIDAZOLE (METROCREAM) 0.75 % cream once daily. Facial cream 10/19/2014: Received from: [place]   No facility-administered encounter medications on file as of 07/10/2017.     Functional Ability / Safety Screening 1.  Was the timed Get Up and Go test less than 12 seconds?  yes 2.  Does the patient need help with the phone, transportation, shopping,      preparing meals, housework, laundry, medications, or managing money?  no 3.  Does the patient's home have:  loose throw rugs in the hallway?   no      Grab bars in the bathroom? yes      Handrails on the stairs?   yes      Good lighting?   yes 4.  Has the patient noticed any hearing difficulties?   no  Advanced Directives Does patient have a HCPOA?    yes If yes, name and contact information: Husband, Web Ashbaugh, [phone] Does patient have a living will or MOST form?  yes; patient will bring [place] a copy If she has been well, give resuscitation a try; if anything happens here, try to resuscitate; full code at her current level of health  Immunizations: discussed  Fall Risk Assessment See under rooming  Depression Screen See under rooming Depression screen [facility] 2/9 07/10/2017 06/09/2016 06/06/2015 10/19/2014 11/24/2012  Decreased Interest 0 0 0 0 0   Down, Depressed, Hopeless 0 0 0 0 0  PHQ - 2 Score 0 0 0 0 0    Objective:   Vitals: BP (!) 144/80   Pulse 75   Temp 98.1 F (36.7 C) (Oral)   Resp 14   Ht '5\' 3"'  (1.6 m)   Wt 127 lb 9.6 oz (57.9 kg)   SpO2 95%   BMI 22.60 kg/m  Body mass index is 22.6 kg/m. No exam data present  Physical Exam  Constitutional: She appears well-developed and well-nourished.  HENT:  Right Ear: Tympanic membrane and ear canal normal.  Left Ear: Tympanic membrane and ear canal normal.  Mouth/Throat: Oropharynx is clear and moist and mucous membranes are normal.  No significant scale in the external auditory canals  Eyes: EOM are normal. No scleral icterus.  Cardiovascular: Normal rate.  Pulmonary/Chest: Effort normal.  Psychiatric: She has a normal mood and affect. Her behavior is normal. Her mood appears not anxious. She does not exhibit a depressed mood.   Mood/affect:  euthyhmic Appearance:  Good hygiene  6CIT Screen 07/10/2017  What Year? 0 points  What month? 0 points  What time? 0 points  Count back from 20 0 points  Months in reverse 0 points  Repeat phrase 0 points  Total Score 0    Assessment & Plan:     Annual Wellness Visit  Reviewed patient's Family Medical History Reviewed and updated list of patient's medical providers Assessment of cognitive impairment was done Assessed patient's functional ability Established a written schedule for health screening [place] Completed and Reviewed  Exercise Activities and Dietary recommendations Goals    . Keep TV off during meals (pt-stated)       Immunization History  Administered Date(s) Administered  . Hepatitis A, Adult  10/08/2012  . Influenza, High Dose Seasonal PF 02/13/2016  . Pneumococcal Conjugate-13 06/09/2016  . Pneumococcal Polysaccharide-23 07/10/2017  . Tdap 11/10/2012  . Zoster 02/04/2012    Health Maintenance  Topic Date Due  . PNA vac Low Risk Adult (2 of 2 - PPSV23)  06/09/2017  . COLONOSCOPY  04/28/2018  . MAMMOGRAM  06/23/2018  . TETANUS/TDAP  11/11/2022  . INFLUENZA VACCINE  Completed  . Hepatitis C Screening  Completed  . DEXA SCAN  Addressed    Discussed health benefits of physical activity, and encouraged her to engage in regular exercise appropriate for her age and condition.   Meds ordered this encounter  Medications  . estrogens, conjugated, (PREMARIN) 0.45 MG tablet    Sig: Take 1 tablet (0.45 mg total) by mouth daily.    Dispense:  30 tablet    Refill:  0  . metroNIDAZOLE (METROCREAM) 0.75 % cream    Sig: Apply topically daily.    Dispense:  45 g    Refill:  11  . atorvastatin (LIPITOR) 40 MG tablet    Sig: Take 1 tablet (40 mg total) by mouth at bedtime.    Dispense:  90 tablet    Refill:  1    REQUESTING 90 DAY RX FOR PT TO POTENTIALLY SAVE ON COPAY    Current Outpatient Medications:  .  aspirin EC 81 MG tablet, Take 1 tablet (81 mg total) by mouth daily., Disp: , Rfl:  .  atorvastatin (LIPITOR) 40 MG tablet, Take 1 tablet (40 mg total) by mouth at bedtime., Disp: 90 tablet, Rfl: 1 .  B Complex Vitamins (VITAMIN-B COMPLEX) TABS, Take 1 tablet by mouth daily. , Disp: , Rfl:  .  Biotin 5000 MCG CAPS, Take 1 capsule by mouth 1 day or 1 dose. , Disp: , Rfl:  .  estrogens, conjugated, (PREMARIN) 0.45 MG tablet, Take 1 tablet (0.45 mg total) by mouth daily., Disp: 30 tablet, Rfl: 0 .  levothyroxine (SYNTHROID) 88 MCG tablet, Take 1 tablet (88 mcg total) by mouth daily before breakfast., Disp: 30 tablet, Rfl: 11 .  meloxicam (MOBIC) 15 MG tablet, Take 1 tablet (15 mg total) by mouth daily as needed., Disp: , Rfl:  .  metroNIDAZOLE (METROCREAM) 0.75 % cream, Apply topically daily., Disp: 45 g, Rfl: 11 .  Multiple Vitamins-Minerals (PRESERVISION AREDS PO), Take 1 tablet by mouth daily. , Disp: , Rfl:  .  Probiotic Product (PROBIOTIC PO), Take 1 tablet by mouth daily. , Disp: , Rfl:  Medications Discontinued During This Encounter   Medication Reason  . estradiol (ESTRACE) 1 MG tablet Patient Preference  . metroNIDAZOLE (METROCREAM) 0.75 % cream Reorder  . ERGOCALCIFEROL PO Completed Course  . meloxicam (MOBIC) 15 MG tablet Reorder  . aspirin EC 81 MG tablet Reorder  . atorvastatin (LIPITOR) 40 MG tablet Reorder    Next Medicare Wellness Visit in 12+ months  Problem List Items Addressed This Visit      Cardiovascular and Mediastinum   Atherosclerosis of aorta (H[facility])    Check lipids today; goal LDL is less than 70; hx of smoking, but quit; try to limit fatty meats      Relevant Medications   aspirin EC 81 MG tablet   atorvastatin (LIPITOR) 40 MG tablet     Endocrine   Hypothyroidism, acquired    Check TSH today and adjust medicine if needed; try to avoid TSH under 1 to prevent accelerated bone loss      Relevant Orders  TSH     Musculoskeletal and Integument   Osteopenia    Check DEXA; continue calcium in diet, vit D 1000 iu daily; fall precautions      Relevant Orders   DG Bone Density     Other   Need for 23-polyvalent pneumococcal polysaccharide vaccine - Primary   Relevant Orders   Pneumococcal polysaccharide vaccine 23-valent greater than or equal to 2yo subcutaneous/IM (Completed)   Menopausal symptoms    Patient desires strongly to continue HRT; she was reminded today of risk of breast cancer, stroke, heart attack, and she wishes to remain on this medicine, accepting the risk      Low serum vitamin D    Check level and supplement if needed      Relevant Orders   VITAMIN D 25 Hydroxy (Vit-D Deficiency, Fractures)   Low back pain    Patient asked for flexeril and I explained that is not a drug I would want to give her because of her age; she can try conservative measures, but nothing Rx that would increase risk of falls      Relevant Medications   meloxicam (MOBIC) 15 MG tablet   aspirin EC 81 MG tablet   Hyperlipidemia LDL goal <100    Check lipids today      Relevant  Medications   aspirin EC 81 MG tablet   atorvastatin (LIPITOR) 40 MG tablet   Other Relevant Orders   Lipid panel   Hormone replacement therapy (HRT)    Patient is aware of the risks, breast cancer, heart attack, stroke, and strongly wants to stay on some form of HRT; she does not want to come off; I will try the lower strength premarin (she has taking 0.625 mg), and she agrees to try the 0.45 mg strength; mammogram done last month, personally reviewed report prior to prescribing today      Full code status    Discussion today; full code for now      Family history of abdominal aortic aneurysm (AAA)    Already screened      Encounter for Medicare annual wellness exam    USPSTF grade A and B recommendations reviewed with patient; age-appropriate recommendations, preventive care, screening tests, etc discussed and encouraged; healthy living encouraged; see AVS for patient education given to patient       Other Visit Diagnoses    Medication monitoring encounter       check liver and kidney, check CBC since she is on meloxicam (PRN)   Relevant Orders   CBC with Differential/Platelet   COMPLETE METABOLIC PANEL WITH GFR

## 2017-07-10 NOTE — Assessment & Plan Note (Signed)
Discussion today; full code for now

## 2017-07-10 NOTE — Patient Instructions (Addendum)
See if your shingles vaccine was the old shot called Zostavax -- I believe it was in 2013 Consider getting the new shingles vaccine called Shingrix; that is available for individuals 69 years of age and older, and is recommended even if you have had shingles in the past and/or already received the old shingles vaccine (Zostavax); it is a two-part series, and is available at many local pharmacies  You have received the Pneumovax vaccine (PPSV-23) and you will not need another booster of this for the rest of your life per current ACIP guidelines  Try to limit saturated fats in your diet (bologna, hot dogs, barbeque, cheeseburgers, hamburgers, steak, bacon, sausage, cheese, etc.) and get more fresh fruits, vegetables, and whole grains  Call me in 4 weeks with an update on estrogen issue  Health Maintenance  Topic Date Due  . PNA vac Low Risk Adult (2 of 2 - PPSV23) 06/09/2017  . COLONOSCOPY  04/28/2018  . MAMMOGRAM  06/23/2018  . TETANUS/TDAP  11/11/2022  . INFLUENZA VACCINE  Completed  . Hepatitis C Screening  Completed  . DEXA SCAN  Addressed     Health Maintenance, Female Adopting a healthy lifestyle and getting preventive care can go a long way to promote health and wellness. Talk with your health care provider about what schedule of regular examinations is right for you. This is a good chance for you to check in with your provider about disease prevention and staying healthy. In between checkups, there are plenty of things you can do on your own. Experts have done a lot of research about which lifestyle changes and preventive measures are most likely to keep you healthy. Ask your health care provider for more information. Weight and diet Eat a healthy diet  Be sure to include plenty of vegetables, fruits, low-fat dairy products, and lean protein.  Do not eat a lot of foods high in solid fats, added sugars, or salt.  Get regular exercise. This is one of the most important things you  can do for your health. ? Most adults should exercise for at least 150 minutes each week. The exercise should increase your heart rate and make you sweat (moderate-intensity exercise). ? Most adults should also do strengthening exercises at least twice a week. This is in addition to the moderate-intensity exercise.  Maintain a healthy weight  Body mass index (BMI) is a measurement that can be used to identify possible weight problems. It estimates body fat based on height and weight. Your health care provider can help determine your BMI and help you achieve or maintain a healthy weight.  For females 88 years of age and older: ? A BMI below 18.5 is considered underweight. ? A BMI of 18.5 to 24.9 is normal. ? A BMI of 25 to 29.9 is considered overweight. ? A BMI of 30 and above is considered obese.  Watch levels of cholesterol and blood lipids  You should start having your blood tested for lipids and cholesterol at 69 years of age, then have this test every 5 years.  You may need to have your cholesterol levels checked more often if: ? Your lipid or cholesterol levels are high. ? You are older than 69 years of age. ? You are at high risk for heart disease.  Cancer screening Lung Cancer  Lung cancer screening is recommended for adults 75-14 years old who are at high risk for lung cancer because of a history of smoking.  A yearly low-dose CT scan of  the lungs is recommended for people who: ? Currently smoke. ? Have quit within the past 15 years. ? Have at least a 30-pack-year history of smoking. A pack year is smoking an average of one pack of cigarettes a day for 1 year.  Yearly screening should continue until it has been 15 years since you quit.  Yearly screening should stop if you develop a health problem that would prevent you from having lung cancer treatment.  Breast Cancer  Practice breast self-awareness. This means understanding how your breasts normally appear and  feel.  It also means doing regular breast self-exams. Let your health care provider know about any changes, no matter how small.  If you are in your 20s or 30s, you should have a clinical breast exam (CBE) by a health care provider every 1-3 years as part of a regular health exam.  If you are 43 or older, have a CBE every year. Also consider having a breast X-ray (mammogram) every year.  If you have a family history of breast cancer, talk to your health care provider about genetic screening.  If you are at high risk for breast cancer, talk to your health care provider about having an MRI and a mammogram every year.  Breast cancer gene (BRCA) assessment is recommended for women who have family members with BRCA-related cancers. BRCA-related cancers include: ? Breast. ? Ovarian. ? Tubal. ? Peritoneal cancers.  Results of the assessment will determine the need for genetic counseling and BRCA1 and BRCA2 testing.  Cervical Cancer Your health care provider may recommend that you be screened regularly for cancer of the pelvic organs (ovaries, uterus, and vagina). This screening involves a pelvic examination, including checking for microscopic changes to the surface of your cervix (Pap test). You may be encouraged to have this screening done every 3 years, beginning at age 51.  For women ages 63-65, health care providers may recommend pelvic exams and Pap testing every 3 years, or they may recommend the Pap and pelvic exam, combined with testing for human papilloma virus (HPV), every 5 years. Some types of HPV increase your risk of cervical cancer. Testing for HPV may also be done on women of any age with unclear Pap test results.  Other health care providers may not recommend any screening for nonpregnant women who are considered low risk for pelvic cancer and who do not have symptoms. Ask your health care provider if a screening pelvic exam is right for you.  If you have had past treatment for  cervical cancer or a condition that could lead to cancer, you need Pap tests and screening for cancer for at least 20 years after your treatment. If Pap tests have been discontinued, your risk factors (such as having a new sexual partner) need to be reassessed to determine if screening should resume. Some women have medical problems that increase the chance of getting cervical cancer. In these cases, your health care provider may recommend more frequent screening and Pap tests.  Colorectal Cancer  This type of cancer can be detected and often prevented.  Routine colorectal cancer screening usually begins at 69 years of age and continues through 69 years of age.  Your health care provider may recommend screening at an earlier age if you have risk factors for colon cancer.  Your health care provider may also recommend using home test kits to check for hidden blood in the stool.  A small camera at the end of a tube can be used  to examine your colon directly (sigmoidoscopy or colonoscopy). This is done to check for the earliest forms of colorectal cancer.  Routine screening usually begins at age 70.  Direct examination of the colon should be repeated every 5-10 years through 69 years of age. However, you may need to be screened more often if early forms of precancerous polyps or small growths are found.  Skin Cancer  Check your skin from head to toe regularly.  Tell your health care provider about any new moles or changes in moles, especially if there is a change in a mole's shape or color.  Also tell your health care provider if you have a mole that is larger than the size of a pencil eraser.  Always use sunscreen. Apply sunscreen liberally and repeatedly throughout the day.  Protect yourself by wearing long sleeves, pants, a wide-brimmed hat, and sunglasses whenever you are outside.  Heart disease, diabetes, and high blood pressure  High blood pressure causes heart disease and increases  the risk of stroke. High blood pressure is more likely to develop in: ? People who have blood pressure in the high end of the normal range (130-139/85-89 mm Hg). ? People who are overweight or obese. ? People who are African American.  If you are 53-92 years of age, have your blood pressure checked every 3-5 years. If you are 10 years of age or older, have your blood pressure checked every year. You should have your blood pressure measured twice-once when you are at a hospital or clinic, and once when you are not at a hospital or clinic. Record the average of the two measurements. To check your blood pressure when you are not at a hospital or clinic, you can use: ? An automated blood pressure machine at a pharmacy. ? A home blood pressure monitor.  If you are between 65 years and 21 years old, ask your health care provider if you should take aspirin to prevent strokes.  Have regular diabetes screenings. This involves taking a blood sample to check your fasting blood sugar level. ? If you are at a normal weight and have a low risk for diabetes, have this test once every three years after 69 years of age. ? If you are overweight and have a high risk for diabetes, consider being tested at a younger age or more often. Preventing infection Hepatitis B  If you have a higher risk for hepatitis B, you should be screened for this virus. You are considered at high risk for hepatitis B if: ? You were born in a country where hepatitis B is common. Ask your health care provider which countries are considered high risk. ? Your parents were born in a high-risk country, and you have not been immunized against hepatitis B (hepatitis B vaccine). ? You have HIV or AIDS. ? You use needles to inject street drugs. ? You live with someone who has hepatitis B. ? You have had sex with someone who has hepatitis B. ? You get hemodialysis treatment. ? You take certain medicines for conditions, including cancer, organ  transplantation, and autoimmune conditions.  Hepatitis C  Blood testing is recommended for: ? Everyone born from [birth year] through 1967. ? Anyone with known risk factors for hepatitis C.  Sexually transmitted infections (STIs)  You should be screened for sexually transmitted infections (STIs) including gonorrhea and chlamydia if: ? You are sexually active and are younger than 69 years of age. ? You are older than 69 years of age  and your health care provider tells you that you are at risk for this type of infection. ? Your sexual activity has changed since you were last screened and you are at an increased risk for chlamydia or gonorrhea. Ask your health care provider if you are at risk.  If you do not have HIV, but are at risk, it may be recommended that you take a prescription medicine daily to prevent HIV infection. This is called pre-exposure prophylaxis (PrEP). You are considered at risk if: ? You are sexually active and do not regularly use condoms or know the HIV status of your partner(s). ? You take drugs by injection. ? You are sexually active with a partner who has HIV.  Talk with your health care provider about whether you are at high risk of being infected with HIV. If you choose to begin PrEP, you should first be tested for HIV. You should then be tested every 3 months for as long as you are taking PrEP. Pregnancy  If you are premenopausal and you may become pregnant, ask your health care provider about preconception counseling.  If you may become pregnant, take 400 to 800 micrograms (mcg) of folic acid every day.  If you want to prevent pregnancy, talk to your health care provider about birth control (contraception). Osteoporosis and menopause  Osteoporosis is a disease in which the bones lose minerals and strength with aging. This can result in serious bone fractures. Your risk for osteoporosis can be identified using a bone density scan.  If you are 72 years of age or  older, or if you are at risk for osteoporosis and fractures, ask your health care provider if you should be screened.  Ask your health care provider whether you should take a calcium or vitamin D supplement to lower your risk for osteoporosis.  Menopause may have certain physical symptoms and risks.  Hormone replacement therapy may reduce some of these symptoms and risks. Talk to your health care provider about whether hormone replacement therapy is right for you. Follow these instructions at home:  Schedule regular health, dental, and eye exams.  Stay current with your immunizations.  Do not use any tobacco products including cigarettes, chewing tobacco, or electronic cigarettes.  If you are pregnant, do not drink alcohol.  If you are breastfeeding, limit how much and how often you drink alcohol.  Limit alcohol intake to no more than 1 drink per day for nonpregnant women. One drink equals 12 ounces of beer, 5 ounces of wine, or 1 ounces of hard liquor.  Do not use street drugs.  Do not share needles.  Ask your health care provider for help if you need support or information about quitting drugs.  Tell your health care provider if you often feel depressed.  Tell your health care provider if you have ever been abused or do not feel safe at home. This information is not intended to replace advice given to you by your health care provider. Make sure you discuss any questions you have with your health care provider. Document Released: 10/28/2010 Document Revised: 09/20/2015 Document Reviewed: 01/16/2015 Elsevier Interactive Patient Education  [employer].

## 2017-07-10 NOTE — Assessment & Plan Note (Signed)
Patient desires strongly to continue HRT; she was reminded today of risk of breast cancer, stroke, heart attack, and she wishes to remain on this medicine, accepting the risk

## 2017-07-10 NOTE — Assessment & Plan Note (Signed)
Patient is aware of the risks, breast cancer, heart attack, stroke, and strongly wants to stay on some form of HRT; she does not want to come off; I will try the lower strength premarin (she has taking 0.625 mg), and she agrees to try the 0.45 mg strength; mammogram done last month, personally reviewed report prior to prescribing today

## 2017-07-10 NOTE — Assessment & Plan Note (Addendum)
Check lipids today; goal LDL is less than 70; hx of smoking, but quit; try to limit fatty meats

## 2017-07-10 NOTE — Assessment & Plan Note (Signed)
USPSTF grade A and B recommendations reviewed with patient; age-appropriate recommendations, preventive care, screening tests, etc discussed and encouraged; healthy living encouraged; see AVS for patient education given to patient  

## 2017-07-10 NOTE — Assessment & Plan Note (Signed)
Check level and supplement if needed 

## 2017-07-10 NOTE — Assessment & Plan Note (Signed)
Check TSH today and adjust medicine if needed; try to avoid TSH under 1 to prevent accelerated bone loss

## 2017-07-10 NOTE — Assessment & Plan Note (Signed)
Check DEXA; continue calcium in diet, vit D 1000 iu daily; fall precautions

## 2017-07-10 NOTE — Assessment & Plan Note (Signed)
Check lipids today 

## 2017-07-10 NOTE — Assessment & Plan Note (Signed)
Patient asked for flexeril and I explained that is not a drug I would want to give her because of her age; she can try conservative measures, but nothing Rx that would increase risk of falls

## 2017-07-11 LAB — COMPLETE METABOLIC PANEL WITH GFR
AG Ratio: 1.6 (calc) (ref 1.0–2.5)
ALT: 21 U/L (ref 6–29)
AST: 23 U/L (ref 10–35)
Albumin: 4.5 g/dL (ref 3.6–5.1)
Alkaline phosphatase (APISO): 61 U/L (ref 33–130)
BILIRUBIN TOTAL: 0.4 mg/dL (ref 0.2–1.2)
BUN: 11 mg/dL (ref 7–25)
CHLORIDE: 103 mmol/L (ref 98–110)
CO2: 28 mmol/L (ref 20–32)
CREATININE: 0.68 mg/dL (ref 0.50–0.99)
Calcium: 9.9 mg/dL (ref 8.6–10.4)
GFR, Est African American: 104 mL/min/{1.73_m2} (ref >=60)
GFR, Est Non African American: 90 mL/min/{1.73_m2} (ref >=60)
GLOBULIN: 2.8 g/dL (ref 1.9–3.7)
GLUCOSE: 69 mg/dL (ref 65–139)
Potassium: 4.2 mmol/L (ref 3.5–5.3)
SODIUM: 140 mmol/L (ref 135–146)
Total Protein: 7.3 g/dL (ref 6.1–8.1)

## 2017-07-11 LAB — CBC WITH DIFFERENTIAL/PLATELET
BASOS ABS: 73 {cells}/uL (ref 0–200)
Basophils Relative: 0.9 %
EOS ABS: 251 {cells}/uL (ref 15–500)
EOS PCT: 3.1 %
HEMATOCRIT: 40.4 % (ref 35.0–45.0)
Hemoglobin: 14 g/dL (ref 11.7–15.5)
Lymphs Abs: 2195 cells/uL (ref 850–3900)
MCH: 30.8 pg (ref 27.0–33.0)
MCHC: 34.7 g/dL (ref 32.0–36.0)
MCV: 89 fL (ref 80.0–100.0)
MONOS PCT: 9.5 %
MPV: 10.4 fL (ref 7.5–12.5)
NEUTROS PCT: 59.4 %
Neutro Abs: 4811 cells/uL (ref 1500–7800)
Platelets: 242 10*3/uL (ref 140–400)
RBC: 4.54 10*6/uL (ref 3.80–5.10)
RDW: 12.2 % (ref 11.0–15.0)
Total Lymphocyte: 27.1 %
WBC mixed population: 770 cells/uL (ref 200–950)
WBC: 8.1 10*3/uL (ref 3.8–10.8)

## 2017-07-11 LAB — TSH: TSH: 2.45 mIU/L (ref 0.40–4.50)

## 2017-07-11 LAB — LIPID PANEL
CHOL/HDL RATIO: 4.1 (calc) (ref <5.0)
Cholesterol: 228 mg/dL — ABNORMAL HIGH (ref <200)
HDL: 55 mg/dL (ref >50)
LDL Cholesterol (Calc): 136 mg/dL (calc) — ABNORMAL HIGH
Non-HDL Cholesterol (Calc): 173 mg/dL (calc) — ABNORMAL HIGH (ref <130)
Triglycerides: 232 mg/dL — ABNORMAL HIGH (ref <150)

## 2017-07-11 LAB — VITAMIN D 25 HYDROXY (VIT D DEFICIENCY, FRACTURES): VIT D 25 HYDROXY: 55 ng/mL (ref 30–100)

## 2017-07-13 ENCOUNTER — Encounter: Payer: Self-pay | Admitting: Family Medicine

## 2017-07-23 ENCOUNTER — Other Ambulatory Visit: Payer: Self-pay | Admitting: Family Medicine

## 2017-07-23 NOTE — Telephone Encounter (Signed)
Lab Results  Component Value Date   TSH 2.45 07/10/2017

## 2017-08-08 ENCOUNTER — Other Ambulatory Visit: Payer: Self-pay | Admitting: Family Medicine

## 2017-08-08 NOTE — Telephone Encounter (Signed)
Risks discussed with patient 07/10/17; mammogram UTD, on the chart, report reviewed; Rx approved

## 2017-08-19 ENCOUNTER — Encounter: Payer: Self-pay | Admitting: Family Medicine

## 2017-08-21 DIAGNOSIS — H02439 Paralytic ptosis unspecified eyelid: Secondary | ICD-10-CM | POA: Diagnosis not present

## 2017-08-21 DIAGNOSIS — H18413 Arcus senilis, bilateral: Secondary | ICD-10-CM | POA: Diagnosis not present

## 2017-08-21 DIAGNOSIS — H2513 Age-related nuclear cataract, bilateral: Secondary | ICD-10-CM | POA: Diagnosis not present

## 2017-10-05 DIAGNOSIS — H2511 Age-related nuclear cataract, right eye: Secondary | ICD-10-CM | POA: Diagnosis not present

## 2017-10-05 HISTORY — PX: EYE SURGERY: SHX253

## 2017-10-06 DIAGNOSIS — H2511 Age-related nuclear cataract, right eye: Secondary | ICD-10-CM | POA: Diagnosis not present

## 2017-10-06 DIAGNOSIS — Z961 Presence of intraocular lens: Secondary | ICD-10-CM | POA: Diagnosis not present

## 2017-10-26 DIAGNOSIS — H2512 Age-related nuclear cataract, left eye: Secondary | ICD-10-CM | POA: Diagnosis not present

## 2017-12-22 DIAGNOSIS — R69 Illness, unspecified: Secondary | ICD-10-CM | POA: Diagnosis not present

## 2017-12-24 DIAGNOSIS — M461 Sacroiliitis, not elsewhere classified: Secondary | ICD-10-CM | POA: Diagnosis not present

## 2017-12-24 DIAGNOSIS — M545 Low back pain: Secondary | ICD-10-CM | POA: Diagnosis not present

## 2017-12-24 DIAGNOSIS — M9903 Segmental and somatic dysfunction of lumbar region: Secondary | ICD-10-CM | POA: Diagnosis not present

## 2017-12-24 DIAGNOSIS — M5432 Sciatica, left side: Secondary | ICD-10-CM | POA: Diagnosis not present

## 2017-12-24 DIAGNOSIS — M9904 Segmental and somatic dysfunction of sacral region: Secondary | ICD-10-CM | POA: Diagnosis not present

## 2017-12-29 DIAGNOSIS — M9904 Segmental and somatic dysfunction of sacral region: Secondary | ICD-10-CM | POA: Diagnosis not present

## 2017-12-29 DIAGNOSIS — M5432 Sciatica, left side: Secondary | ICD-10-CM | POA: Diagnosis not present

## 2017-12-29 DIAGNOSIS — M545 Low back pain: Secondary | ICD-10-CM | POA: Diagnosis not present

## 2017-12-29 DIAGNOSIS — M9903 Segmental and somatic dysfunction of lumbar region: Secondary | ICD-10-CM | POA: Diagnosis not present

## 2017-12-29 DIAGNOSIS — M461 Sacroiliitis, not elsewhere classified: Secondary | ICD-10-CM | POA: Diagnosis not present

## 2017-12-31 DIAGNOSIS — M545 Low back pain: Secondary | ICD-10-CM | POA: Diagnosis not present

## 2017-12-31 DIAGNOSIS — M461 Sacroiliitis, not elsewhere classified: Secondary | ICD-10-CM | POA: Diagnosis not present

## 2017-12-31 DIAGNOSIS — M9903 Segmental and somatic dysfunction of lumbar region: Secondary | ICD-10-CM | POA: Diagnosis not present

## 2017-12-31 DIAGNOSIS — M5432 Sciatica, left side: Secondary | ICD-10-CM | POA: Diagnosis not present

## 2017-12-31 DIAGNOSIS — M9904 Segmental and somatic dysfunction of sacral region: Secondary | ICD-10-CM | POA: Diagnosis not present

## 2018-01-04 DIAGNOSIS — M545 Low back pain: Secondary | ICD-10-CM | POA: Diagnosis not present

## 2018-01-04 DIAGNOSIS — M9903 Segmental and somatic dysfunction of lumbar region: Secondary | ICD-10-CM | POA: Diagnosis not present

## 2018-01-04 DIAGNOSIS — M9904 Segmental and somatic dysfunction of sacral region: Secondary | ICD-10-CM | POA: Diagnosis not present

## 2018-01-04 DIAGNOSIS — M461 Sacroiliitis, not elsewhere classified: Secondary | ICD-10-CM | POA: Diagnosis not present

## 2018-01-04 DIAGNOSIS — M5432 Sciatica, left side: Secondary | ICD-10-CM | POA: Diagnosis not present

## 2018-01-20 DIAGNOSIS — M461 Sacroiliitis, not elsewhere classified: Secondary | ICD-10-CM | POA: Diagnosis not present

## 2018-01-20 DIAGNOSIS — M5432 Sciatica, left side: Secondary | ICD-10-CM | POA: Diagnosis not present

## 2018-01-20 DIAGNOSIS — M545 Low back pain: Secondary | ICD-10-CM | POA: Diagnosis not present

## 2018-01-20 DIAGNOSIS — M9904 Segmental and somatic dysfunction of sacral region: Secondary | ICD-10-CM | POA: Diagnosis not present

## 2018-01-20 DIAGNOSIS — M9903 Segmental and somatic dysfunction of lumbar region: Secondary | ICD-10-CM | POA: Diagnosis not present

## 2018-01-22 ENCOUNTER — Other Ambulatory Visit: Payer: Self-pay

## 2018-01-22 ENCOUNTER — Telehealth: Payer: Self-pay | Admitting: Family Medicine

## 2018-01-22 DIAGNOSIS — E785 Hyperlipidemia, unspecified: Secondary | ICD-10-CM

## 2018-01-22 MED ORDER — ATORVASTATIN CALCIUM 40 MG PO TABS: 40.0000 mg | ORAL_TABLET | Freq: Every day | ORAL | 1 refills | Status: DC

## 2018-01-22 NOTE — Telephone Encounter (Signed)
Copied from [place] [id number]. Topic: Quick Communication - Rx Refill/Question >> Jan 22, 2018 10:35 AM Samantha Booker wrote: Medication:    atorvastatin (LIPITOR) 40 MG tablet  Has the patient contacted their pharmacy? No. New pharmacy (Agent: If no, request that the patient contact the pharmacy for the refill.) (Agent: If yes, when and what did the pharmacy advise?)  Preferred Pharmacy (with phone number or street name):     [id number] - [place], [place] - [address] [phone] (Phone) [phone] (Fax)    Agent: Please be advised that RX refills may take up to 3 business days. We ask that you follow-up with your pharmacy.

## 2018-01-28 DIAGNOSIS — H43813 Vitreous degeneration, bilateral: Secondary | ICD-10-CM | POA: Diagnosis not present

## 2018-02-10 DIAGNOSIS — H43813 Vitreous degeneration, bilateral: Secondary | ICD-10-CM | POA: Diagnosis not present

## 2018-02-10 DIAGNOSIS — H26492 Other secondary cataract, left eye: Secondary | ICD-10-CM | POA: Diagnosis not present

## 2018-02-10 DIAGNOSIS — H43393 Other vitreous opacities, bilateral: Secondary | ICD-10-CM | POA: Diagnosis not present

## 2018-02-15 DIAGNOSIS — M25552 Pain in left hip: Secondary | ICD-10-CM | POA: Diagnosis not present

## 2018-02-15 DIAGNOSIS — M1612 Unilateral primary osteoarthritis, left hip: Secondary | ICD-10-CM | POA: Diagnosis not present

## 2018-03-04 DIAGNOSIS — H43312 Vitreous membranes and strands, left eye: Secondary | ICD-10-CM | POA: Diagnosis not present

## 2018-03-04 DIAGNOSIS — H26492 Other secondary cataract, left eye: Secondary | ICD-10-CM | POA: Diagnosis not present

## 2018-03-04 DIAGNOSIS — H43392 Other vitreous opacities, left eye: Secondary | ICD-10-CM | POA: Diagnosis not present

## 2018-03-08 DIAGNOSIS — R69 Illness, unspecified: Secondary | ICD-10-CM | POA: Diagnosis not present

## 2018-03-31 DIAGNOSIS — R69 Illness, unspecified: Secondary | ICD-10-CM | POA: Diagnosis not present

## 2018-04-29 ENCOUNTER — Encounter: Payer: Self-pay | Admitting: Family Medicine

## 2018-04-29 ENCOUNTER — Ambulatory Visit (INDEPENDENT_AMBULATORY_CARE_PROVIDER_SITE_OTHER): Payer: Medicare HMO | Admitting: Family Medicine

## 2018-04-29 VITALS — BP 122/80 | HR 74 | Temp 98.5°F | Ht 62.5 in | Wt 118.4 lb

## 2018-04-29 DIAGNOSIS — R634 Abnormal weight loss: Secondary | ICD-10-CM

## 2018-04-29 DIAGNOSIS — I700 Atherosclerosis of aorta: Secondary | ICD-10-CM

## 2018-04-29 DIAGNOSIS — Z5181 Encounter for therapeutic drug level monitoring: Secondary | ICD-10-CM

## 2018-04-29 DIAGNOSIS — K625 Hemorrhage of anus and rectum: Secondary | ICD-10-CM

## 2018-04-29 DIAGNOSIS — E039 Hypothyroidism, unspecified: Secondary | ICD-10-CM

## 2018-04-29 DIAGNOSIS — R7989 Other specified abnormal findings of blood chemistry: Secondary | ICD-10-CM

## 2018-04-29 DIAGNOSIS — E785 Hyperlipidemia, unspecified: Secondary | ICD-10-CM | POA: Diagnosis not present

## 2018-04-29 DIAGNOSIS — Z1211 Encounter for screening for malignant neoplasm of colon: Secondary | ICD-10-CM

## 2018-04-29 DIAGNOSIS — R1032 Left lower quadrant pain: Secondary | ICD-10-CM

## 2018-04-29 DIAGNOSIS — K6289 Other specified diseases of anus and rectum: Secondary | ICD-10-CM

## 2018-04-29 LAB — HEMOCCULT GUIAC POC 1CARD (OFFICE): Fecal Occult Blood, POC: NEGATIVE

## 2018-04-29 NOTE — Assessment & Plan Note (Signed)
Goal LDL is less than 70 

## 2018-04-29 NOTE — Progress Notes (Signed)
BP 122/80   Pulse 74   Temp 98.5 F (36.9 C) (Oral)   Ht 5' 2.5" (1.588 m)   Wt 118 lb 6.4 oz (53.7 kg)   SpO2 99%   BMI 21.31 kg/m    Subjective:    Patient ID: Samantha Booker, female    DOB: [birth year], 68 y.o.   MRN: [id number]  HPI: Samantha Booker is a 68 y.o. female  Chief Complaint  Patient presents with  . Hemorrhoids    onset couple months, try has been treating with preperation H    HPI Patient is here for an acute visit Really bad external hemorrhoid Going on for 3 months Just a touch of BRBPR, right after BMs Pain and burning and itching No marbling or mixed blood in the stool She has lost weight, but quit eating sugars and carbs; intentional 9 pounds over 9 months; low carb diet started in October; doing that since October actually Last colonoscopy was 2010; no fam hx of colorectal cancer  Going on since childbirth, off and on; first baby was 7 pounds, second was 8 pounds 3 ounces, third was 7 pounds; all vaginal deliveries; did have some stitches with larger child No recent constipation Maybe not enough water intake Good fiber eater No hx of hemorrhoid procedures  Left ovary was not visible on prior US in 2016  She is going to be returning soon for other issues She has a hx of vitamin D deficiency She has hypothyroidism; weight loss but not feeling jittery and no hair loss Lab Results  Component Value Date   TSH 2.45 07/10/2017    She has high cholesterol; does eat bologna, hot dogs; not much bacon; does like cheese  Lab Results  Component Value Date   CHOL 228 (H) 07/10/2017   HDL 55 07/10/2017   LDLCALC 136 (H) 07/10/2017   TRIG 232 (H) 07/10/2017   CHOLHDL 4.1 07/10/2017   Reviewed echo from 2009; LVEF 60%  Taking meloxicam quite a bit; PA G[facility] prescribing that; occasional tylenol (okay I explained); it upsets her stomach, just when absolutely needed  Not taking premarin  Depression screen [facility] 2/9 04/29/2018 07/10/2017  06/09/2016 06/06/2015 10/19/2014  Decreased Interest 0 0 0 0 0  Down, Depressed, Hopeless 0 0 0 0 0  PHQ - 2 Score 0 0 0 0 0  Altered sleeping 0 - - - -  Tired, decreased energy 0 - - - -  Change in appetite 0 - - - -  Feeling bad or failure about yourself  0 - - - -  Trouble concentrating 0 - - - -  Moving slowly or fidgety/restless 0 - - - -  Suicidal thoughts 0 - - - -  PHQ-9 Score 0 - - - -  Difficult doing work/chores Not difficult at all - - - -   Fall Risk  04/29/2018 07/10/2017 06/09/2016 06/06/2015 10/19/2014  Falls in the past year? 0 No No No No  Number falls in past yr: 0 - - - -  Injury with Fall? 0 - - - -    Relevant past medical, surgical, family and social history reviewed Past Medical History:  Diagnosis Date  . Atherosclerosis of aorta ([place]) 06/25/2016   Imaging Feb 2018  . Chicken pox   . 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 History of hepatitis B   . Hyperlipidemia   . Jaundice   . Kidney stone   .  Thyroid disease    Past Surgical History:  Procedure Laterality Date  . ABDOMINAL HYSTERECTOMY  1980   precancerous cells  . APPENDECTOMY  1979  . CATARACT EXTRACTION Bilateral 2019  . CHOLECYSTECTOMY  2011  . EYE SURGERY Left 10/05/2017  . ROOT CANAL    . suburethral sling  2012   Dr. Audie Box   Family History  Problem Relation Age of Onset  . Diabetes Mother   . Heart disease Mother        CABG 2007  . Hyperlipidemia Mother   . COPD Mother   . Hyperlipidemia Father   . Heart disease Father   . Cancer Maternal Grandmother        breast  . Cancer Maternal Aunt        breast  . Parkinson's disease Brother   . Cancer Cousin        brain  . Cancer Cousin        melanoma   Social History   Tobacco Use  . Smoking status: Former Research scientist (life sciences)  . Smokeless tobacco: Never Used  . Tobacco comment: patient quit in 1993  Substance Use Topics  . Alcohol use: Yes    Alcohol/week: 0.0 standard drinks    Comment: socially  . Drug  use: No     Office Visit from 04/29/2018 in [facility]  AUDIT-C Score  1      Interim medical history since last visit reviewed. Allergies and medications reviewed  Review of Systems  Constitutional: Positive for unexpected weight change (cutting back on carbs, but has lost 9 pounds in +/- 2 months).  Gastrointestinal: Positive for anal bleeding and rectal pain. Negative for blood in stool and constipation.   Per HPI unless specifically indicated above     Objective:    BP 122/80   Pulse 74   Temp 98.5 F (36.9 C) (Oral)   Ht 5' 2.5" (1.588 m)   Wt 118 lb 6.4 oz (53.7 kg)   SpO2 99%   BMI 21.31 kg/m   Wt Readings from Last 3 Encounters:  04/29/18 118 lb 6.4 oz (53.7 kg)  07/10/17 127 lb 9.6 oz (57.9 kg)  06/09/16 123 lb 9 oz (56 kg)    Physical Exam Constitutional:      General: She is not in acute distress.    Appearance: She is well-developed. She is not diaphoretic.     Comments: Weight loss noted  HENT:     Head: Normocephalic and atraumatic.  Eyes:     General: No scleral icterus. Neck:     Thyroid: No thyromegaly.  Cardiovascular:     Rate and Rhythm: Normal rate and regular rhythm.     Heart sounds: Normal heart sounds. No murmur.  Pulmonary:     Effort: Pulmonary effort is normal. No respiratory distress.     Breath sounds: Normal breath sounds. No wheezing.  Abdominal:     General: Bowel sounds are normal. There is no distension.     Palpations: Abdomen is soft. There is mass (fullness in the right lower quadrant along colic gutter).     Tenderness: There is abdominal tenderness in the left lower quadrant. There is no guarding.     Hernia: No hernia is present.  Genitourinary:    Rectum: Guaiac result negative. Mass, tenderness and anal fissure present.     Comments: Fungating mass at the anus; two fissures present, some macerated skin noted; tender; no dilated veins noted; no active bleeding; DRE  attempted with only about a  centimeter of entry; tissue internally was not hard; not enough stool obtained for reliable hemoccult Skin:    General: Skin is warm and dry.     Coloration: Skin is not pale.  Neurological:     Mental Status: She is alert.  Psychiatric:        Behavior: Behavior normal.        Thought Content: Thought content normal.        Judgment: Judgment normal.     Results for orders placed or performed in visit on 04/29/18  POCT Occult Blood Stool  Result Value Ref Range   Fecal Occult Blood, POC Negative Negative   Card #1 Date 04/29/18    Card #2 Fecal Occult Blod, POC     Card #2 Date     Card #3 Fecal Occult Blood, POC     Card #3 Date        Assessment & Plan:   Problem List Items Addressed This Visit      Cardiovascular and Mediastinum   Atherosclerosis of aorta (HCC)    Goal LDL is less than 70        Endocrine   Hypothyroidism, acquired   Relevant Orders   TSH     Other   Low serum vitamin D   Relevant Orders   VITAMIN D 25 Hydroxy (Vit-D Deficiency, Fractures)   Hyperlipidemia LDL goal <100   Relevant Orders   Lipid panel    Other Visit Diagnoses    Rectal mass    -  Primary   Relevant Orders   Ambulatory referral to Gastroenterology   CBC with Differential/Platelet   POCT Occult Blood Stool (Completed)   CT Abdomen Pelvis W Contrast   Left lower quadrant pain       Relevant Orders   Ambulatory referral to Gastroenterology   CT Abdomen Pelvis W Contrast   Weight loss       Relevant Orders   Ambulatory referral to Gastroenterology   COMPLETE METABOLIC PANEL WITH GFR   TSH   CT Abdomen Pelvis W Contrast   Screen for colon cancer       Relevant Orders   POCT Occult Blood Stool (Completed)   BRBPR (bright red blood per rectum)       Relevant Orders   Ambulatory referral to Gastroenterology   CT Abdomen Pelvis W Contrast   Medication monitoring encounter       Relevant Orders   CBC with Differential/Platelet   COMPLETE METABOLIC PANEL WITH GFR    Abnormal weight loss       Relevant Orders   CT Abdomen Pelvis W Contrast      Follow up plan: Return in about 3 weeks (around 05/20/2018) for complete physical if covered with insurance [facility] [employer]).  An after-visit summary was printed and given to the patient at [facility].  Please see the patient instructions which may contain other information and recommendations beyond what is mentioned above in the assessment and plan.  No orders of the defined types were placed in this encounter.   Orders Placed This Encounter  Procedures  . CT Abdomen Pelvis W Contrast  . CBC with Differential/Platelet  . COMPLETE METABOLIC PANEL WITH GFR  . TSH  . VITAMIN D 25 Hydroxy (Vit-D Deficiency, Fractures)  . Lipid panel  . Ambulatory referral to Gastroenterology  . POCT Occult Blood Stool

## 2018-04-30 LAB — CBC WITH DIFFERENTIAL/PLATELET
Absolute Monocytes: 736 cells/uL (ref 200–950)
Basophils Absolute: 72 cells/uL (ref 0–200)
Basophils Relative: 0.9 %
EOS ABS: 200 {cells}/uL (ref 15–500)
Eosinophils Relative: 2.5 %
HCT: 42 % (ref 35.0–45.0)
Hemoglobin: 14.3 g/dL (ref 11.7–15.5)
Lymphs Abs: 2608 cells/uL (ref 850–3900)
MCH: 31.2 pg (ref 27.0–33.0)
MCHC: 34 g/dL (ref 32.0–36.0)
MCV: 91.5 fL (ref 80.0–100.0)
MPV: 10.9 fL (ref 7.5–12.5)
Monocytes Relative: 9.2 %
Neutro Abs: 4384 cells/uL (ref 1500–7800)
Neutrophils Relative %: 54.8 %
Platelets: 265 10*3/uL (ref 140–400)
RBC: 4.59 10*6/uL (ref 3.80–5.10)
RDW: 12.2 % (ref 11.0–15.0)
Total Lymphocyte: 32.6 %
WBC: 8 10*3/uL (ref 3.8–10.8)

## 2018-04-30 LAB — TSH: TSH: 10.23 mIU/L — ABNORMAL HIGH (ref 0.40–4.50)

## 2018-04-30 LAB — LIPID PANEL
Cholesterol: 172 mg/dL (ref <200)
HDL: 55 mg/dL (ref >50)
LDL Cholesterol (Calc): 90 mg/dL (calc)
Non-HDL Cholesterol (Calc): 117 mg/dL (calc) (ref <130)
Total CHOL/HDL Ratio: 3.1 (calc) (ref <5.0)
Triglycerides: 172 mg/dL — ABNORMAL HIGH (ref <150)

## 2018-04-30 LAB — COMPLETE METABOLIC PANEL WITH GFR
AG Ratio: 1.7 (calc) (ref 1.0–2.5)
ALBUMIN MSPROF: 4.5 g/dL (ref 3.6–5.1)
ALT: 28 U/L (ref 6–29)
AST: 26 U/L (ref 10–35)
Alkaline phosphatase (APISO): 56 U/L (ref 33–130)
BUN: 16 mg/dL (ref 7–25)
CO2: 28 mmol/L (ref 20–32)
Calcium: 10.6 mg/dL — ABNORMAL HIGH (ref 8.6–10.4)
Chloride: 103 mmol/L (ref 98–110)
Creat: 0.67 mg/dL (ref 0.50–0.99)
GFR, Est African American: 104 mL/min/{1.73_m2} (ref >=60)
GFR, Est Non African American: 90 mL/min/{1.73_m2} (ref >=60)
Globulin: 2.6 g/dL (calc) (ref 1.9–3.7)
Glucose, Bld: 95 mg/dL (ref 65–139)
Potassium: 3.8 mmol/L (ref 3.5–5.3)
SODIUM: 141 mmol/L (ref 135–146)
TOTAL PROTEIN: 7.1 g/dL (ref 6.1–8.1)
Total Bilirubin: 0.3 mg/dL (ref 0.2–1.2)

## 2018-04-30 LAB — VITAMIN D 25 HYDROXY (VIT D DEFICIENCY, FRACTURES): Vit D, 25-Hydroxy: 62 ng/mL (ref 30–100)

## 2018-05-05 ENCOUNTER — Other Ambulatory Visit: Payer: Self-pay

## 2018-05-05 ENCOUNTER — Telehealth: Payer: Self-pay | Admitting: Family Medicine

## 2018-05-05 ENCOUNTER — Ambulatory Visit: Payer: Medicare HMO

## 2018-05-05 ENCOUNTER — Encounter: Payer: Self-pay | Admitting: Gastroenterology

## 2018-05-05 ENCOUNTER — Other Ambulatory Visit: Payer: Self-pay | Admitting: Family Medicine

## 2018-05-05 ENCOUNTER — Ambulatory Visit (INDEPENDENT_AMBULATORY_CARE_PROVIDER_SITE_OTHER): Payer: PPO | Admitting: Gastroenterology

## 2018-05-05 VITALS — BP 130/82 | HR 53 | Resp 16 | Ht 62.5 in | Wt 117.4 lb

## 2018-05-05 DIAGNOSIS — K642 Third degree hemorrhoids: Secondary | ICD-10-CM

## 2018-05-05 DIAGNOSIS — I700 Atherosclerosis of aorta: Secondary | ICD-10-CM

## 2018-05-05 DIAGNOSIS — Z1211 Encounter for screening for malignant neoplasm of colon: Secondary | ICD-10-CM

## 2018-05-05 DIAGNOSIS — M542 Cervicalgia: Secondary | ICD-10-CM | POA: Diagnosis not present

## 2018-05-05 DIAGNOSIS — M5412 Radiculopathy, cervical region: Secondary | ICD-10-CM | POA: Diagnosis not present

## 2018-05-05 DIAGNOSIS — K644 Residual hemorrhoidal skin tags: Secondary | ICD-10-CM

## 2018-05-05 DIAGNOSIS — E039 Hypothyroidism, unspecified: Secondary | ICD-10-CM

## 2018-05-05 MED ORDER — LEVOTHYROXINE SODIUM 100 MCG PO TABS: 100.0000 ug | ORAL_TABLET | Freq: Every day | ORAL | 1 refills | Status: DC

## 2018-05-05 MED ORDER — HYDROCORTISONE 2.5 % RE CREA: 1.0000 "application " | TOPICAL_CREAM | Freq: Three times a day (TID) | RECTAL | 1 refills | Status: AC

## 2018-05-05 NOTE — Telephone Encounter (Signed)
Left detailed voicemail

## 2018-05-05 NOTE — Progress Notes (Signed)
Cephas Darby, MD [address]  [place]  [place], [place] [address]  Main: [phone]  Fax: [phone]    Gastroenterology Consultation  Referring Provider:     Arnetha Courser, MD Primary Care Physician:  Arnetha Courser, MD Primary Gastroenterologist:  Dr. Cephas Darby Reason for Consultation:     Rectal discomfort, ? rectal mass        HPI:   Samantha Booker is a 68 y.o. Caucasian female referred by Dr. Sanda Klein, Satira Anis, MD  for consultation & management of possible rectal mass and colonoscopy.  Patient reports that she has been experiencing severe rectal discomfort associated with itching, burning, irritation and intermittent rectal bleeding since 01/2018.  She saw Dr. Manus Gunning who was concerned about possible rectal mass.  Patient reports that she was having hemorrhoidal symptoms intermittently but has noticed protrusion of soft tissue outside the anal canal which has to be manually reduced in the last 3 months.  She denies constipation, diarrhea, abdominal pain.  She lost some weight intentionally and she is following low-fat low-carb diet.  She denies any other GI symptoms today.  Patient has tried Preparation H with no relief.  She has history of hypothyroidism, currently on Synthroid.  Most recent TSH on 04/29/2018 came back elevated.  Hemoglobin is normal, LFTs are normal.  She has history of hepatitis B with unclear status  NSAIDs: None  Antiplts/Anticoagulants/Anti thrombotics: None  GI Procedures: Colonoscopy in 2005, reportedly normal  Past Medical History:  Diagnosis Date  . Atherosclerosis of aorta ([place]) 06/25/2016   Imaging Feb 2018  . Chicken pox   . 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 History of hepatitis B   . Hyperlipidemia   . Jaundice   . Kidney stone   . Thyroid disease     Past Surgical History:  Procedure Laterality Date  . ABDOMINAL HYSTERECTOMY  1980   precancerous cells  . APPENDECTOMY  1979  .  CATARACT EXTRACTION Bilateral 2019  . CHOLECYSTECTOMY  2011  . EYE SURGERY Left 10/05/2017  . ROOT CANAL    . suburethral sling  2012   Dr. Audie Box   Current Outpatient Medications:  .  aspirin EC 81 MG tablet, Take 1 tablet (81 mg total) by mouth daily., Disp: , Rfl:  .  atorvastatin (LIPITOR) 40 MG tablet, Take 1 tablet (40 mg total) by mouth at bedtime., Disp: 90 tablet, Rfl: 1 .  B Complex Vitamins (VITAMIN-B COMPLEX) TABS, Take 1 tablet by mouth daily. , Disp: , Rfl:  .  Biotin 5000 MCG CAPS, Take 1 capsule by mouth 1 day or 1 dose. , Disp: , Rfl:  .  cholecalciferol (VITAMIN D3) 25 MCG (1000 UT) tablet, Take 1,000 Units by mouth daily., Disp: , Rfl:  .  gabapentin (NEURONTIN) 400 MG capsule, Take by mouth., Disp: , Rfl:  .  levothyroxine (SYNTHROID, LEVOTHROID) 88 MCG tablet, TAKE 1 TABLET ONCE A DAY BEFORE BREAKFAST., Disp: 30 tablet, Rfl: 11 .  meloxicam (MOBIC) 15 MG tablet, Take 1 tablet (15 mg total) by mouth daily as needed., Disp: , Rfl:  .  metroNIDAZOLE (METROCREAM) 0.75 % cream, Apply topically daily., Disp: 45 g, Rfl: 11 .  Probiotic Product (PROBIOTIC PO), Take 1 tablet by mouth daily. , Disp: , Rfl:  .  hydrocortisone (ANUSOL-HC) 2.5 % rectal cream, Place 1 application rectally 3 (three) times daily for 14 days., Disp: 30 g, Rfl: 1 .  Multiple Vitamins-Minerals (PRESERVISION AREDS PO), Take 1 tablet by mouth daily. , Disp: , Rfl:    Family History  Problem Relation Age of Onset  . Diabetes Mother   . Heart disease Mother        CABG 2007  . Hyperlipidemia Mother   . COPD Mother   . Hyperlipidemia Father   . Heart disease Father   . Cancer Maternal Grandmother        breast  . Cancer Maternal Aunt        breast  . Parkinson's disease Brother   . Cancer Cousin        brain  . Cancer Cousin        melanoma     Social History   Tobacco Use  . Smoking status: Former Research scientist (life sciences)  . Smokeless tobacco: Never Used  . Tobacco comment: patient quit in 1993  Substance  Use Topics  . Alcohol use: Yes    Alcohol/week: 0.0 standard drinks    Comment: socially  . Drug use: No    Allergies as of 05/05/2018 - Review Complete 05/05/2018  Allergen Reaction Noted  . Penicillins Other (See Comments) 03/07/2010    Review of Systems:    All systems reviewed and negative except where noted in HPI.   Physical Exam:  BP 130/82 (BP Location: Left Arm, Patient Position: Sitting, Cuff Size: Normal)   Pulse (!) 53   Resp 16   Ht 5' 2.5" (1.588 m)   Wt 117 lb 6.4 oz (53.3 kg)   BMI 21.13 kg/m  No LMP recorded. Patient has had a hysterectomy.  General:   Alert,  Well-developed, well-nourished, pleasant and cooperative in NAD Head:  Normocephalic and atraumatic. Eyes:  Sclera clear, no icterus.   Conjunctiva pink. Ears:  Normal auditory acuity. Nose:  No deformity, discharge, or lesions. Mouth:  No deformity or lesions,oropharynx pink & moist. Neck:  Supple; no masses or thyromegaly. Lungs:  Respirations even and unlabored.  Clear throughout to auscultation.   No wheezes, crackles, or rhonchi. No acute distress. Heart:  Regular rate and rhythm; no murmurs, clicks, rubs, or gallops. Abdomen:  Normal bowel sounds. Soft, non-tender and non-distended without masses, hepatosplenomegaly or hernias noted.  No guarding or rebound tenderness.   Rectal: Skin tags and prolapsed external hemorrhoids, digital rectal exam revealed mild tenderness in the anal canal with no evidence of anal fissure Msk:  Symmetrical without gross deformities. Good, equal movement & strength bilaterally. Pulses:  Normal pulses noted. Extremities:  No clubbing or edema.  No cyanosis. Neurologic:  Alert and oriented x3;  grossly normal neurologically. Skin:  Intact without significant lesions or rashes. No jaundice. Psych:  Alert and cooperative. Normal mood and affect.  Imaging Studies: None  Assessment and Plan:   Samantha Booker is a 68 y.o. Caucasian female with 42-months history of  symptomatic, grade 3 external hemorrhoids, also overdue for screening colonoscopy.  There is no evidence of rectal mass on rectal exam today  Grade 3 external hemorrhoids Recommend sitz baths, witch hazel, Tucks pads Recommend 2 weeks trial of Anusol cream twice daily Discussed with her about outpatient hemorrhoid banding after colonoscopy  Screening colonoscopy, her last colonoscopy was in 2005 reportedly normal Recommend colonoscopy and patient is agreeable I have discussed alternative options, risks & benefits,  which include, but are not limited to, bleeding, infection, perforation,respiratory complication & drug reaction.  The patient agrees with this plan & written consent will be obtained.     Hypothyroidism  TSH is elevated, asked patient to contact Dr. Sanda Klein for dose adjustment of her Synthroid   Follow up in 4 weeks   Cephas Darby, MD

## 2018-05-05 NOTE — Telephone Encounter (Signed)
Scan canceled

## 2018-05-05 NOTE — Patient Instructions (Addendum)
1. Sitz baths 2. Apply tucks pads 3. Anusol cream 2-3 times daily for 2weeks 4. Witch hazel cream 2-3 times daily 5. Colonoscopy for colon screening   Please call our office to speak with my nurse Barnie Alderman [phone] during business hours from 8am to 4pm if you have any questions/concerns. During after hours, you will be redirected to on call GI physician. For any emergency please call 911 or go the nearest emergency room.    Cephas Darby, MD [address]  [place]  [place], [place] [address]  Main: [phone]  Fax: [phone]

## 2018-05-05 NOTE — Telephone Encounter (Signed)
-----   Message from Lin Landsman, MD sent at 05/05/2018  2:37 PM EST ----- Dr. Sanda Klein  I saw her in my office today.  She does not have rectal mass on exam.  She has prolapsed external hemorrhoids. If you have ordered CT A/P due to concern for rectal mass, I would cancel it.  Looks like, she is scheduled for it tomorrow.  She will be undergoing colonoscopy in next couple of weeks.  Thanks Rohini

## 2018-05-05 NOTE — Telephone Encounter (Signed)
Samantha Booker, please let pt know that we so appreciate Dr. Marius Ditch diagnosing her with hemorrhoids In 20 years, I have not seen hemorrhoids look like that so I really appreciate the specialist seeing her and treating these for her Please cancel the CT scan

## 2018-05-06 ENCOUNTER — Ambulatory Visit: Payer: PPO

## 2018-05-19 DIAGNOSIS — M6281 Muscle weakness (generalized): Secondary | ICD-10-CM | POA: Diagnosis not present

## 2018-05-19 DIAGNOSIS — M542 Cervicalgia: Secondary | ICD-10-CM | POA: Diagnosis not present

## 2018-05-20 ENCOUNTER — Ambulatory Visit: Payer: PPO | Admitting: Anesthesiology

## 2018-05-20 ENCOUNTER — Encounter
Admission: RE | Disposition: A | Discharge status: Home / Self Care | Payer: Self-pay | Source: Home / Self Care | Attending: Gastroenterology

## 2018-05-20 ENCOUNTER — Ambulatory Visit
Admission: RE | Admit: 2018-05-20 | Discharge: 2018-05-20 | Disposition: A | Discharge status: Home / Self Care | Payer: PPO | Attending: Gastroenterology | Admitting: Gastroenterology

## 2018-05-20 DIAGNOSIS — E039 Hypothyroidism, unspecified: Secondary | ICD-10-CM | POA: Insufficient documentation

## 2018-05-20 DIAGNOSIS — D124 Benign neoplasm of descending colon: Secondary | ICD-10-CM | POA: Diagnosis not present

## 2018-05-20 DIAGNOSIS — E785 Hyperlipidemia, unspecified: Secondary | ICD-10-CM | POA: Diagnosis not present

## 2018-05-20 DIAGNOSIS — Z87891 Personal history of nicotine dependence: Secondary | ICD-10-CM | POA: Insufficient documentation

## 2018-05-20 DIAGNOSIS — Z87442 Personal history of urinary calculi: Secondary | ICD-10-CM | POA: Insufficient documentation

## 2018-05-20 DIAGNOSIS — Z880 Allergy status to penicillin: Secondary | ICD-10-CM | POA: Insufficient documentation

## 2018-05-20 DIAGNOSIS — Z7982 Long term (current) use of aspirin: Secondary | ICD-10-CM | POA: Insufficient documentation

## 2018-05-20 DIAGNOSIS — Z1211 Encounter for screening for malignant neoplasm of colon: Secondary | ICD-10-CM

## 2018-05-20 DIAGNOSIS — D122 Benign neoplasm of ascending colon: Secondary | ICD-10-CM | POA: Diagnosis not present

## 2018-05-20 DIAGNOSIS — K644 Residual hemorrhoidal skin tags: Secondary | ICD-10-CM

## 2018-05-20 DIAGNOSIS — K635 Polyp of colon: Secondary | ICD-10-CM | POA: Diagnosis not present

## 2018-05-20 DIAGNOSIS — Z79899 Other long term (current) drug therapy: Secondary | ICD-10-CM | POA: Diagnosis not present

## 2018-05-20 DIAGNOSIS — Z791 Long term (current) use of non-steroidal anti-inflammatories (NSAID): Secondary | ICD-10-CM | POA: Diagnosis not present

## 2018-05-20 HISTORY — PX: COLONOSCOPY WITH PROPOFOL: SHX5780

## 2018-05-20 SURGERY — COLONOSCOPY WITH PROPOFOL
Anesthesia: General

## 2018-05-20 MED ORDER — SODIUM CHLORIDE 0.9 % IV SOLN
INTRAVENOUS | Status: DC
  Administered 2018-05-20: 08:00:00 via INTRAVENOUS
  Administered 2018-05-20: 1000 mL via INTRAVENOUS

## 2018-05-20 MED ORDER — PROPOFOL 500 MG/50ML IV EMUL
INTRAVENOUS | Status: DC | PRN
  Administered 2018-05-20: 75 ug/kg/min via INTRAVENOUS

## 2018-05-20 MED ORDER — PROPOFOL 500 MG/50ML IV EMUL
INTRAVENOUS | Status: AC
  Filled 2018-05-20: qty 50

## 2018-05-20 MED ORDER — PROPOFOL 10 MG/ML IV BOLUS
INTRAVENOUS | Status: DC | PRN
  Administered 2018-05-20 (×2): 25 mg via INTRAVENOUS

## 2018-05-20 NOTE — H&P (Signed)
Cephas Darby, MD [address]  [place]  [place], [place] [address]  Main: [phone]  Fax: [phone] Pager: [phone]  Primary Care Physician:  Arnetha Courser, MD Primary Gastroenterologist:  Dr. Cephas Darby  Pre-Procedure History & Physical: HPI:  Samantha Booker is a 68 y.o. female is here for an colonoscopy.   Past Medical History:  Diagnosis Date  . Atherosclerosis of aorta ([place]) 06/25/2016   Imaging Feb 2018  . Chicken pox   . 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 History of hepatitis B   . Hyperlipidemia   . Jaundice   . Kidney stone   . Thyroid disease     Past Surgical History:  Procedure Laterality Date  . ABDOMINAL HYSTERECTOMY  1980   precancerous cells  . APPENDECTOMY  1979  . CATARACT EXTRACTION Bilateral 2019  . CHOLECYSTECTOMY  2011  . EYE SURGERY Left 10/05/2017  . ROOT CANAL    . suburethral sling  2012   Dr. Audie Box    Prior to Admission medications   Medication Sig Start Date End Date Taking? Authorizing Provider  aspirin EC 81 MG tablet Take 1 tablet (81 mg total) by mouth daily. 07/10/17  Yes Lada, Satira Anis, MD  atorvastatin (LIPITOR) 40 MG tablet Take 1 tablet (40 mg total) by mouth at bedtime. 01/22/18  Yes Lada, Satira Anis, MD  B Complex Vitamins (VITAMIN-B COMPLEX) TABS Take 1 tablet by mouth daily.    Yes [provider]  Biotin 5000 MCG CAPS Take 1 capsule by mouth 1 day or 1 dose.    Yes [provider]  cholecalciferol (VITAMIN D3) 25 MCG (1000 UT) tablet Take 1,000 Units by mouth daily.   Yes [provider]  gabapentin (NEURONTIN) 400 MG capsule Take by mouth. 05/05/18  Yes [provider]  levothyroxine (SYNTHROID, LEVOTHROID) 100 MCG tablet TAKE 1 TABLET(100 MCG) BY MOUTH DAILY 05/05/18  Yes Lada, Satira Anis, MD  meloxicam (MOBIC) 15 MG tablet Take 1 tablet (15 mg total) by mouth daily as needed. 07/10/17  Yes Lada, Satira Anis, MD  metroNIDAZOLE  (METROCREAM) 0.75 % cream Apply topically daily. 07/10/17  Yes Lada, Satira Anis, MD  Multiple Vitamins-Minerals (PRESERVISION AREDS PO) Take 1 tablet by mouth daily.    Yes [provider]  Probiotic Product (PROBIOTIC PO) Take 1 tablet by mouth daily.    Yes [provider]    Allergies as of 05/05/2018 - Review Complete 05/05/2018  Allergen Reaction Noted  . Penicillins Other (See Comments) 03/07/2010    Family History  Problem Relation Age of Onset  . Diabetes Mother   . Heart disease Mother        CABG 2007  . Hyperlipidemia Mother   . COPD Mother   . Hyperlipidemia Father   . Heart disease Father   . Cancer Maternal Grandmother        breast  . Cancer Maternal Aunt        breast  . Parkinson's disease Brother   . Cancer Cousin        brain  . Cancer Cousin        melanoma    Social History   Socioeconomic History  . Marital status: Married    Spouse name: Not on file  . Number of children: 2  . Years of education: Not on file  . Highest education level: Not on file  Occupational History  .  Occupation: retired  Scientific laboratory technician  . Financial resource strain: Not on file  . Food insecurity:    Worry: Not on file    Inability: Not on file  . Transportation needs:    Medical: Not on file    Non-medical: Not on file  Tobacco Use  . Smoking status: Former Research scientist (life sciences)  . Smokeless tobacco: Never Used  . Tobacco comment: patient quit in 1993  Substance and Sexual Activity  . Alcohol use: Yes    Alcohol/week: 0.0 standard drinks    Comment: socially  . Drug use: No  . Sexual activity: Yes    Partners: Male  Lifestyle  . Physical activity:    Days per week: Not on file    Minutes per session: Not on file  . Stress: Not on file  Relationships  . Social connections:    Talks on phone: Not on file    Gets together: Not on file    Attends religious service: Not on file    Active member of club or organization: Not on file    Attends meetings of clubs  or organizations: Not on file    Relationship status: Not on file  . Intimate partner violence:    Fear of current or ex partner: Not on file    Emotionally abused: Not on file    Physically abused: Not on file    Forced sexual activity: Not on file  Other Topics Concern  . Not on file  Social History Narrative   Lives in [place]. Married. 2 children, 1 living in [place], 1 in [place]    Review of Systems: See HPI, otherwise negative ROS  Physical Exam: BP 120/83   Pulse 81   Temp 97.9 F (36.6 C) (Tympanic)   Resp 20   Ht 5' 2.5" (1.588 m)   Wt 53.5 kg   SpO2 100%   BMI 21.24 kg/m  General:   Alert,  pleasant and cooperative in NAD Head:  Normocephalic and atraumatic. Neck:  Supple; no masses or thyromegaly. Lungs:  Clear throughout to auscultation.    Heart:  Regular rate and rhythm. Abdomen:  Soft, nontender and nondistended. Normal bowel sounds, without guarding, and without rebound.   Neurologic:  Alert and  oriented x4;  grossly normal neurologically.  Impression/Plan: Samantha Booker is here for an colonoscopy to be performed for colon cancer screening  Risks, benefits, limitations, and alternatives regarding  colonoscopy have been reviewed with the patient.  Questions have been answered.  All parties agreeable.   Sherri Sear, MD  05/20/2018, 8:27 AM

## 2018-05-20 NOTE — Anesthesia Preprocedure Evaluation (Addendum)
Anesthesia Evaluation  Patient identified by MRN, date of birth, ID band Patient awake    Reviewed: Allergy & Precautions, H&P , NPO status , Patient's Chart, lab work & pertinent test results  Airway Mallampati: II  TM Distance: >3 FB     Dental  (+) Teeth Intact   Pulmonary neg pulmonary ROS, former smoker,           Cardiovascular negative cardio ROS       Neuro/Psych negative neurological ROS  negative psych ROS   GI/Hepatic negative GI ROS, Neg liver ROS,   Endo/Other  Hypothyroidism   Renal/GU Renal disease (stones)  negative genitourinary   Musculoskeletal   Abdominal   Peds  Hematology negative hematology ROS (+)   Anesthesia Other Findings Past Medical History: 06/25/2016: Atherosclerosis of aorta [facility])     Comment:  Imaging Feb 2018 No date: Chicken pox No date: Foliclar lymph grade III, unsp, nodes of head, face, 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 Audie Box     Reproductive/Obstetrics negative OB ROS                            Anesthesia Physical Anesthesia Plan  ASA: II  Anesthesia Plan: General   Post-op Pain Management:    Induction:   PONV Risk Score and Plan: Propofol infusion and TIVA  Airway Management Planned:   Additional Equipment:   Intra-op Plan:   Post-operative Plan:   Informed Consent: I have reviewed the patients History and Physical, chart, labs and discussed the procedure including the risks, benefits and alternatives for the proposed anesthesia with the patient or authorized  representative who has indicated his/her understanding and acceptance.     Dental Advisory Given  Plan Discussed with: Anesthesiologist and CRNA  Anesthesia Plan Comments:         Anesthesia Quick Evaluation

## 2018-05-20 NOTE — Op Note (Signed)
[facility] Patient Name: Samantha Booker Procedure Date: 05/20/2018 8:30 AM MRN: [id number] Account #: [id number] Date of Birth: [birth year] Admit Type: Outpatient Age: 68 Room: [facility] ROOM 4 Gender: Female Note Status: Finalized Procedure:            Colonoscopy Indications:          Screening for colorectal malignant neoplasm Providers:            Lin Landsman MD, MD Referring MD:         Arnetha Courser (Referring MD) Medicines:            Monitored Anesthesia Care Complications:        No immediate complications. Estimated blood loss: None. Procedure:            Pre-Anesthesia Assessment:                       - Prior to the procedure, a History and Physical was                        performed, and patient medications and allergies were                        reviewed. The patient is competent. The risks and                        benefits of the procedure and the sedation options and                        risks were discussed with the patient. All questions                        were answered and informed consent was obtained.                        Patient identification and proposed procedure were                        verified by the physician, the nurse, the                        anesthesiologist, the anesthetist and the technician in                        the pre-procedure area in the procedure room in the                        endoscopy suite. Mental Status Examination: alert and                        oriented. Airway Examination: normal oropharyngeal                        airway and neck mobility. Respiratory Examination:                        clear to auscultation. CV Examination: normal.                        Prophylactic Antibiotics: The patient does not require  prophylactic antibiotics. Prior Anticoagulants: The                        patient has taken no previous anticoagulant or                         antiplatelet agents. ASA Grade Assessment: II - A                        patient with mild systemic disease. After reviewing the                        risks and benefits, the patient was deemed in                        satisfactory condition to undergo the procedure. The                        anesthesia plan was to use monitored anesthesia care                        (MAC). Immediately prior to administration of                        medications, the patient was re-assessed for adequacy                        to receive sedatives. The heart rate, respiratory rate,                        oxygen saturations, blood pressure, adequacy of                        pulmonary ventilation, and response to care were                        monitored throughout the procedure. The physical status                        of the patient was re-assessed after the procedure.                       After obtaining informed consent, the colonoscope was                        passed under direct vision. Throughout the procedure,                        the patient's blood pressure, pulse, and oxygen                        saturations were monitored continuously. The                        Colonoscope was introduced through the anus and                        advanced to the the cecum, identified by appendiceal  orifice and ileocecal valve. The colonoscopy was                        performed without difficulty. The patient tolerated the                        procedure well. The quality of the bowel preparation                        was evaluated using the BBPS [facility] Bowel Preparation                        Scale) with scores of: Right Colon = 3, Transverse                        Colon = 3 and Left Colon = 3 (entire mucosa seen well                        with no residual staining, small fragments of stool or                        opaque liquid). The total BBPS score  equals 9. Findings:      Hemorrhoids were found on perianal exam.      A 6 mm polyp was found in the ascending colon. The polyp was sessile.       The polyp was removed with a cold snare. Resection and retrieval were       complete.      A 8 mm polyp was found in the descending colon. The polyp was sessile.       The polyp was removed with a hot snare. Resection and retrieval were       complete.      Non-bleeding external hemorrhoids were found during retroflexion. The       hemorrhoids were large. Impression:           - Hemorrhoids found on perianal exam.                       - One 6 mm polyp in the ascending colon, removed with a                        cold snare. Resected and retrieved.                       - One 8 mm polyp in the descending colon, removed with                        a hot snare. Resected and retrieved.                       - Non-bleeding external hemorrhoids. Recommendation:       - Discharge patient to home (with spouse).                       - Resume previous diet today.                       - Continue present medications.                       -  Await pathology results.                       - Repeat colonoscopy in 3 - 5 years for surveillance                        based on pathology results.                       - Return to my office as previously scheduled. Procedure Code(s):    --- Professional ---                       [phone], Colonoscopy, flexible; with removal of tumor(s),                        polyp(s), or other lesion(s) by snare technique Diagnosis Code(s):    --- Professional ---                       Z12.11, Encounter for screening for malignant neoplasm                        of colon                       K64.4, Residual hemorrhoidal skin tags                       D12.2, Benign neoplasm of ascending colon                       D12.4, Benign neoplasm of descending colon CPT copyright 2018 American Medical Association. All rights  reserved. The codes documented in this report are preliminary and upon coder review may  be revised to meet current compliance requirements. Dr. Ulyess Mort Lin Landsman MD, MD 05/20/2018 8:57:45 AM This report has been signed electronically. Number of Addenda: 0 Note Initiated On: 05/20/2018 8:30 AM Scope Withdrawal Time: 0 hours 13 minutes 18 seconds  Total Procedure Duration: 0 hours 17 minutes 14 seconds       [facility]

## 2018-05-20 NOTE — Transfer of Care (Signed)
Immediate Anesthesia Transfer of Care Note  Patient: Samantha Booker  Procedure(s) Performed: COLONOSCOPY WITH PROPOFOL (N/A )  Patient Location: PACU and Endoscopy Unit  Anesthesia Type:General  Level of Consciousness: awake, alert  and oriented  Airway & Oxygen Therapy: Patient Spontanous Breathing and Patient connected to nasal cannula oxygen  Post-op Assessment: Report given to RN and Post -op Vital signs reviewed and stable  Post vital signs: Reviewed and stable  Last Vitals:  Vitals Value Taken Time  BP 90/59 05/20/2018  8:58 AM  Temp 36.1 C 05/20/2018  8:58 AM  Pulse 63 05/20/2018  8:58 AM  Resp 12 05/20/2018  8:58 AM  SpO2 100 % 05/20/2018  8:58 AM    Last Pain:  Vitals:   05/20/18 0858  TempSrc: Tympanic  PainSc: Asleep         Complications: No apparent anesthesia complications

## 2018-05-20 NOTE — Anesthesia Post-op Follow-up Note (Signed)
Anesthesia QCDR form completed.        

## 2018-05-20 NOTE — Anesthesia Postprocedure Evaluation (Signed)
Anesthesia Post Note  Patient: Samantha Booker  Procedure(s) Performed: COLONOSCOPY WITH PROPOFOL (N/A )  Patient location during evaluation: PACU Anesthesia Type: General Level of consciousness: awake and alert Pain management: pain level controlled Vital Signs Assessment: post-procedure vital signs reviewed and stable Respiratory status: spontaneous breathing, nonlabored ventilation and respiratory function stable Cardiovascular status: blood pressure returned to baseline and stable Postop Assessment: no apparent nausea or vomiting Anesthetic complications: no     Last Vitals:  Vitals:   05/20/18 0908 05/20/18 0918  BP: 96/71 106/77  Pulse: 65 61  Resp: 17 15  Temp:    SpO2: 100% 100%    Last Pain:  Vitals:   05/20/18 0918  TempSrc:   PainSc: 0-No pain                 Durenda Hurt

## 2018-05-21 ENCOUNTER — Encounter: Payer: Self-pay | Admitting: Gastroenterology

## 2018-05-21 LAB — SURGICAL PATHOLOGY

## 2018-05-24 DIAGNOSIS — M542 Cervicalgia: Secondary | ICD-10-CM | POA: Diagnosis not present

## 2018-05-24 DIAGNOSIS — M6281 Muscle weakness (generalized): Secondary | ICD-10-CM | POA: Diagnosis not present

## 2018-05-26 ENCOUNTER — Encounter: Payer: Self-pay | Admitting: Gastroenterology

## 2018-05-26 DIAGNOSIS — M6281 Muscle weakness (generalized): Secondary | ICD-10-CM | POA: Diagnosis not present

## 2018-05-26 DIAGNOSIS — M542 Cervicalgia: Secondary | ICD-10-CM | POA: Diagnosis not present

## 2018-06-01 DIAGNOSIS — M6281 Muscle weakness (generalized): Secondary | ICD-10-CM | POA: Diagnosis not present

## 2018-06-01 DIAGNOSIS — M542 Cervicalgia: Secondary | ICD-10-CM | POA: Diagnosis not present

## 2018-06-03 DIAGNOSIS — M542 Cervicalgia: Secondary | ICD-10-CM | POA: Diagnosis not present

## 2018-06-07 DIAGNOSIS — M6281 Muscle weakness (generalized): Secondary | ICD-10-CM | POA: Diagnosis not present

## 2018-06-07 DIAGNOSIS — M542 Cervicalgia: Secondary | ICD-10-CM | POA: Diagnosis not present

## 2018-06-08 ENCOUNTER — Encounter: Payer: Self-pay | Admitting: Gastroenterology

## 2018-06-08 ENCOUNTER — Other Ambulatory Visit: Payer: Self-pay

## 2018-06-08 ENCOUNTER — Ambulatory Visit: Payer: PPO | Admitting: Gastroenterology

## 2018-06-08 VITALS — BP 135/83 | HR 60 | Resp 17 | Ht 62.5 in | Wt 118.2 lb

## 2018-06-08 DIAGNOSIS — K642 Third degree hemorrhoids: Secondary | ICD-10-CM

## 2018-06-08 NOTE — Progress Notes (Signed)

## 2018-06-08 NOTE — Progress Notes (Signed)
Samantha Darby, MD [address]  [place]  [place], [place] [address]  Main: [phone]  Fax: [phone]    Gastroenterology Consultation  Referring Provider:     Arnetha Courser, MD Primary Care Physician:  Samantha Courser, MD Primary Gastroenterologist:  Dr. Cephas Booker Reason for Consultation:    Grade 3 symptomatic external hemorrhoids        HPI:   Samantha Booker is a 68 y.o. Caucasian female referred by Dr. Sanda Booker, Samantha Anis, MD  for consultation & management of possible rectal mass and colonoscopy.  Patient reports that she has been experiencing severe rectal discomfort associated with itching, burning, irritation and intermittent rectal bleeding since 01/2018.  She saw Dr. Sanda Booker who was concerned about possible rectal mass.  Patient reports that she was having hemorrhoidal symptoms intermittently but has noticed protrusion of soft tissue outside the anal canal which has to be manually reduced in the last 3 months.  She denies constipation, diarrhea, abdominal pain.  She lost some weight intentionally and she is following low-fat low-carb diet.  She denies any other GI symptoms today.  Patient has tried Preparation H with no relief.  She has history of hypothyroidism, currently on Synthroid.  Most recent TSH on 04/29/2018 came back elevated.  Hemoglobin is normal, LFTs are normal.  Follow-up visit 1120 Patient underwent colonoscopy which revealed large external hemorrhoids, 2 subcentimeter tubular adenomas without dysplasia which were removed.  Patient is here to discuss about hemorrhoid ligation.  She reports that her hemorrhoid symptoms have significantly improved, she continues to have prolapse of the hemorrhoids that she has to manually reduce sometimes as well as itching for which she is doing witch hazel and Tucks pads.  She has history of hepatitis B with unclear status  NSAIDs: None  Antiplts/Anticoagulants/Anti thrombotics: None  GI Procedures: Colonoscopy  in 2005, reportedly normal Colonoscopy 04/2018 - Hemorrhoids found on perianal exam. - One 6 mm polyp in the ascending colon, removed with a cold snare. Resected and retrieved. - One 8 mm polyp in the descending colon, removed with a hot snare. Resected and retrieved. - Non-bleeding external hemorrhoids. DIAGNOSIS:  A. COLON POLYP X 1, ASCENDING; COLD SNARE:  - SESSILE SERRATED POLYP (ADENOMA), 2 FRAGMENTS.  - NEGATIVE FOR DYSPLASIA AND MALIGNANCY.   B. COLON POLYP X 1, DESCENDING; HOT SNARE:  - CAUTERIZED POLYP, FAVOR SERRATED LESION.  - NO DYSPLASIA OR MALIGNANCY IS IDENTIFIED BUT ARTIFACT LIMITS  INTERPRETATION.    Past Medical History:  Diagnosis Date  . Atherosclerosis of aorta ([place]) 06/25/2016   Imaging Feb 2018  . Chicken pox   . 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 History of hepatitis B   . Hyperlipidemia   . Jaundice   . Kidney stone   . Thyroid disease     Past Surgical History:  Procedure Laterality Date  . ABDOMINAL HYSTERECTOMY  1980   precancerous cells  . APPENDECTOMY  1979  . CATARACT EXTRACTION Bilateral 2019  . CHOLECYSTECTOMY  2011  . COLONOSCOPY WITH PROPOFOL N/A 05/20/2018   Procedure: COLONOSCOPY WITH PROPOFOL;  Surgeon: Samantha Landsman, MD;  Location: [facility] ENDOSCOPY;  Service: Gastroenterology;  Laterality: N/A;  . EYE SURGERY Left 10/05/2017  . ROOT CANAL    . suburethral sling  2012   Dr. Audie Booker   Current Outpatient Medications:  .  acyclovir cream (ZOVIRAX) 5 %, APPLY TO AFFECTED AREA EVERY 4 HOURS PRN, Disp: ,  Rfl:  .  aspirin EC 81 MG tablet, Take 1 tablet (81 mg total) by mouth daily., Disp: , Rfl:  .  atorvastatin (LIPITOR) 40 MG tablet, Take 1 tablet (40 mg total) by mouth at bedtime., Disp: 90 tablet, Rfl: 1 .  B Complex Vitamins (VITAMIN-B COMPLEX) TABS, Take 1 tablet by mouth daily. , Disp: , Rfl:  .  Biotin 5000 MCG CAPS, Take 1 capsule by mouth 1 day or 1 dose. , Disp: , Rfl:  .  cholecalciferol  (VITAMIN D3) 25 MCG (1000 UT) tablet, Take 1,000 Units by mouth daily., Disp: , Rfl:  .  gabapentin (NEURONTIN) 400 MG capsule, Take by mouth., Disp: , Rfl:  .  levothyroxine (SYNTHROID, LEVOTHROID) 100 MCG tablet, TAKE 1 TABLET(100 MCG) BY MOUTH DAILY, Disp: 90 tablet, Rfl: 0 .  meloxicam (MOBIC) 15 MG tablet, Take 1 tablet (15 mg total) by mouth daily as needed., Disp: , Rfl:  .  metroNIDAZOLE (METROCREAM) 0.75 % cream, Apply topically daily., Disp: 45 g, Rfl: 11 .  Multiple Vitamins-Minerals (PRESERVISION AREDS PO), Take 1 tablet by mouth daily. , Disp: , Rfl:  .  Probiotic Product (PROBIOTIC PO), Take 1 tablet by mouth daily. , Disp: , Rfl:  .  amoxicillin (AMOXIL) 500 MG capsule, , Disp: , Rfl:    Family History  Problem Relation Age of Onset  . Diabetes Mother   . Heart disease Mother        CABG 2007  . Hyperlipidemia Mother   . COPD Mother   . Hyperlipidemia Father   . Heart disease Father   . Cancer Maternal Grandmother        breast  . Cancer Maternal Aunt        breast  . Parkinson's disease Brother   . Cancer Cousin        brain  . Cancer Cousin        melanoma     Social History   Tobacco Use  . Smoking status: Former Research scientist (life sciences)  . Smokeless tobacco: Never Used  . Tobacco comment: patient quit in 1993  Substance Use Topics  . Alcohol use: Yes    Alcohol/week: 0.0 standard drinks    Comment: socially  . Drug use: No    Allergies as of 06/08/2018 - Review Complete 06/08/2018  Allergen Reaction Noted  . Penicillins Other (See Comments) 03/07/2010    Review of Systems:    All systems reviewed and negative except where noted in HPI.   Physical Exam:  BP 135/83 (BP Location: Left Arm, Patient Position: Sitting, Cuff Size: Normal)   Pulse 60   Resp 17   Ht 5' 2.5" (1.588 m)   Wt 118 lb 3.2 oz (53.6 kg)   BMI 21.27 kg/m  No LMP recorded. Patient has had a hysterectomy.  General:   Alert,  Well-developed, well-nourished, pleasant and cooperative in  NAD Head:  Normocephalic and atraumatic. Eyes:  Sclera clear, no icterus.   Conjunctiva pink. Ears:  Normal auditory acuity. Nose:  No deformity, discharge, or lesions. Mouth:  No deformity or lesions,oropharynx pink & moist. Neck:  Supple; no masses or thyromegaly. Lungs:  Respirations even and unlabored.  Clear throughout to auscultation.   No wheezes, crackles, or rhonchi. No acute distress. Heart:  Regular rate and rhythm; no murmurs, clicks, rubs, or gallops. Abdomen:  Normal bowel sounds. Soft, non-tender and non-distended without masses, hepatosplenomegaly or hernias noted.  No guarding or rebound tenderness.   Rectal: Skin tags and prolapsed external  hemorrhoids Msk:  Symmetrical without gross deformities. Good, equal movement & strength bilaterally. Pulses:  Normal pulses noted. Extremities:  No clubbing or edema.  No cyanosis. Neurologic:  Alert and oriented x3;  grossly normal neurologically. Skin:  Intact without significant lesions or rashes. No jaundice. Psych:  Alert and cooperative. Normal mood and affect.  Imaging Studies: None  Assessment and Plan:   Samantha Booker is a 68 y.o. Caucasian female with 74-months history of symptomatic, grade 3 external hemorrhoids  Grade 3 symptomatic external hemorrhoids  Discussed with her about outpatient hemorrhoid banding and told her that the prolapsed hemorrhoids may not completely reduce despite banding all 3 hemorrhoids but our goal is to alleviate her hemorrhoidal symptoms.  Patient is willing to give it a try, consent obtained, perform hemorrhoid ligation today.  I also discussed with her the alternative option is surgical removal of the external hemorrhoids.  She felt it is reasonable to try banding first  Tubular adenomas of colon Recommend surveillance colonoscopy in 04/2023   Follow up in 4 weeks   Samantha Darby, MD

## 2018-06-24 ENCOUNTER — Other Ambulatory Visit: Payer: Self-pay

## 2018-06-24 DIAGNOSIS — E039 Hypothyroidism, unspecified: Secondary | ICD-10-CM

## 2018-06-24 DIAGNOSIS — I700 Atherosclerosis of aorta: Secondary | ICD-10-CM | POA: Diagnosis not present

## 2018-06-25 ENCOUNTER — Other Ambulatory Visit: Payer: Self-pay | Admitting: Family Medicine

## 2018-06-25 DIAGNOSIS — E785 Hyperlipidemia, unspecified: Secondary | ICD-10-CM

## 2018-06-25 DIAGNOSIS — Z1231 Encounter for screening mammogram for malignant neoplasm of breast: Secondary | ICD-10-CM | POA: Diagnosis not present

## 2018-06-25 LAB — BASIC METABOLIC PANEL WITH GFR
BUN: 13 mg/dL (ref 7–25)
CO2: 28 mmol/L (ref 20–32)
Calcium: 10 mg/dL (ref 8.6–10.4)
Chloride: 105 mmol/L (ref 98–110)
Creat: 0.63 mg/dL (ref 0.50–0.99)
GFR, Est African American: 106 mL/min/{1.73_m2} (ref >=60)
GFR, Est Non African American: 92 mL/min/{1.73_m2} (ref >=60)
Glucose, Bld: 108 mg/dL — ABNORMAL HIGH (ref 65–99)
Potassium: 4.6 mmol/L (ref 3.5–5.3)
Sodium: 142 mmol/L (ref 135–146)

## 2018-06-25 LAB — LIPID PANEL
Cholesterol: 157 mg/dL (ref <200)
HDL: 51 mg/dL (ref >=50)
LDL Cholesterol (Calc): 79 mg/dL (calc)
Non-HDL Cholesterol (Calc): 106 mg/dL (calc) (ref <130)
Total CHOL/HDL Ratio: 3.1 (calc) (ref <5.0)
Triglycerides: 174 mg/dL — ABNORMAL HIGH (ref <150)

## 2018-06-25 LAB — TSH: TSH: 1.88 mIU/L (ref 0.40–4.50)

## 2018-06-25 MED ORDER — LEVOTHYROXINE SODIUM 100 MCG PO TABS: 100.0000 ug | ORAL_TABLET | Freq: Every day | ORAL | 3 refills | Status: DC

## 2018-06-25 MED ORDER — ATORVASTATIN CALCIUM 40 MG PO TABS: 40.0000 mg | ORAL_TABLET | Freq: Every day | ORAL | 1 refills | Status: DC

## 2018-06-30 ENCOUNTER — Other Ambulatory Visit: Payer: Self-pay | Admitting: Orthopedic Surgery

## 2018-06-30 ENCOUNTER — Other Ambulatory Visit: Payer: Self-pay

## 2018-06-30 ENCOUNTER — Ambulatory Visit: Payer: PPO | Admitting: Gastroenterology

## 2018-06-30 VITALS — BP 129/74

## 2018-06-30 DIAGNOSIS — K642 Third degree hemorrhoids: Secondary | ICD-10-CM

## 2018-06-30 DIAGNOSIS — M542 Cervicalgia: Secondary | ICD-10-CM | POA: Diagnosis not present

## 2018-06-30 DIAGNOSIS — M47812 Spondylosis without myelopathy or radiculopathy, cervical region: Secondary | ICD-10-CM | POA: Diagnosis not present

## 2018-06-30 DIAGNOSIS — M5412 Radiculopathy, cervical region: Secondary | ICD-10-CM | POA: Diagnosis not present

## 2018-06-30 NOTE — Progress Notes (Signed)
PROCEDURE NOTE: The patient presents with symptomatic grade 3 hemorrhoids, unresponsive to maximal medical therapy, requesting rubber band ligation of his/her hemorrhoidal disease.  All risks, benefits and alternative forms of therapy were described and informed consent was obtained.  The decision was made to band the RA internal hemorrhoid, and the [facility]'Regan System was used to perform band ligation without complication.  Digital anorectal examination was then performed to assure proper positioning of the band, and to adjust the banded tissue as required.  The patient was discharged home without pain or other issues.  Dietary and behavioral recommendations were given and (if necessary - prescriptions were given), along with follow-up instructions.  The patient will return 2 weeks for follow-up and possible additional banding as required.  No complications were encountered and the patient tolerated the procedure well.    

## 2018-07-13 ENCOUNTER — Encounter: Payer: Medicare HMO | Admitting: Family Medicine

## 2018-07-15 ENCOUNTER — Telehealth: Payer: Self-pay | Admitting: Gastroenterology

## 2018-07-15 ENCOUNTER — Ambulatory Visit: Payer: PPO | Admitting: Gastroenterology

## 2018-07-15 NOTE — Telephone Encounter (Signed)
Spoke with patient & confirmed appointment was cancelled for 07-15-2018 & reschedule to 08-10-2018 due to the Coronavirus.

## 2018-07-20 ENCOUNTER — Ambulatory Visit: Payer: PPO

## 2018-08-02 ENCOUNTER — Telehealth: Payer: Self-pay | Admitting: Gastroenterology

## 2018-08-02 ENCOUNTER — Other Ambulatory Visit: Payer: Self-pay

## 2018-08-02 MED ORDER — HYDROCORTISONE 2.5 % RE CREA: TOPICAL_CREAM | RECTAL | 0 refills | Status: DC

## 2018-08-02 NOTE — Telephone Encounter (Signed)
Pt needs refill on rx hydrocoredasone send to [facility] [address]

## 2018-08-02 NOTE — Telephone Encounter (Signed)
Medication has been refilled and sent to pharmacy, pt has been notified  

## 2018-08-10 ENCOUNTER — Ambulatory Visit: Payer: PPO | Admitting: Gastroenterology

## 2018-08-20 ENCOUNTER — Ambulatory Visit: Payer: PPO

## 2018-09-06 ENCOUNTER — Other Ambulatory Visit: Payer: Self-pay

## 2018-09-06 ENCOUNTER — Ambulatory Visit
Admission: RE | Admit: 2018-09-06 | Discharge: 2018-09-06 | Disposition: A | Discharge status: Home / Self Care | Payer: PPO | Source: Ambulatory Visit | Attending: Orthopedic Surgery | Admitting: Orthopedic Surgery

## 2018-09-06 DIAGNOSIS — M5412 Radiculopathy, cervical region: Secondary | ICD-10-CM | POA: Insufficient documentation

## 2018-09-06 DIAGNOSIS — M542 Cervicalgia: Secondary | ICD-10-CM | POA: Diagnosis not present

## 2018-09-15 DIAGNOSIS — M5412 Radiculopathy, cervical region: Secondary | ICD-10-CM | POA: Diagnosis not present

## 2018-09-15 DIAGNOSIS — M5030 Other cervical disc degeneration, unspecified cervical region: Secondary | ICD-10-CM | POA: Diagnosis not present

## 2018-09-27 ENCOUNTER — Other Ambulatory Visit: Payer: Self-pay

## 2018-09-27 NOTE — Patient Outreach (Signed)
[facility]) Care Management  09/27/2018  DAVON FOLTA 06/01/1950 [id number]    Late Entry Two Unsuccessful attempts to outreach the patient for HTA/HRA screening on 4/28 and 4/29.  No answer.  HIPAA compliant voicemail left with contact information.  Plan:  RN Health Coach will place the patient in [place] [facility] and follow up in two months.  RN Health Coach will mail unsuccessful letter.  Lazaro Arms RN, BSN, [place] Direct Dial:  [phone]  Fax: [phone]

## 2018-10-08 ENCOUNTER — Ambulatory Visit: Payer: PPO | Admitting: Gastroenterology

## 2018-10-11 DIAGNOSIS — R5383 Other fatigue: Secondary | ICD-10-CM | POA: Diagnosis not present

## 2018-10-11 DIAGNOSIS — Z13820 Encounter for screening for osteoporosis: Secondary | ICD-10-CM | POA: Diagnosis not present

## 2018-10-11 DIAGNOSIS — R1032 Left lower quadrant pain: Secondary | ICD-10-CM | POA: Diagnosis not present

## 2018-10-11 DIAGNOSIS — E782 Mixed hyperlipidemia: Secondary | ICD-10-CM | POA: Diagnosis not present

## 2018-10-11 DIAGNOSIS — Z79899 Other long term (current) drug therapy: Secondary | ICD-10-CM | POA: Diagnosis not present

## 2018-10-11 DIAGNOSIS — R102 Pelvic and perineal pain: Secondary | ICD-10-CM | POA: Diagnosis not present

## 2018-10-11 DIAGNOSIS — R7309 Other abnormal glucose: Secondary | ICD-10-CM | POA: Diagnosis not present

## 2018-10-11 DIAGNOSIS — L989 Disorder of the skin and subcutaneous tissue, unspecified: Secondary | ICD-10-CM | POA: Diagnosis not present

## 2018-10-11 DIAGNOSIS — M5030 Other cervical disc degeneration, unspecified cervical region: Secondary | ICD-10-CM | POA: Insufficient documentation

## 2018-10-11 DIAGNOSIS — K649 Unspecified hemorrhoids: Secondary | ICD-10-CM | POA: Diagnosis not present

## 2018-10-11 DIAGNOSIS — E039 Hypothyroidism, unspecified: Secondary | ICD-10-CM | POA: Diagnosis not present

## 2018-10-11 DIAGNOSIS — R5381 Other malaise: Secondary | ICD-10-CM | POA: Diagnosis not present

## 2018-10-12 ENCOUNTER — Other Ambulatory Visit: Payer: Self-pay | Admitting: Internal Medicine

## 2018-10-12 DIAGNOSIS — R1032 Left lower quadrant pain: Secondary | ICD-10-CM

## 2018-10-13 DIAGNOSIS — M5030 Other cervical disc degeneration, unspecified cervical region: Secondary | ICD-10-CM | POA: Diagnosis not present

## 2018-10-13 DIAGNOSIS — M5412 Radiculopathy, cervical region: Secondary | ICD-10-CM | POA: Diagnosis not present

## 2018-10-15 DIAGNOSIS — R7309 Other abnormal glucose: Secondary | ICD-10-CM | POA: Diagnosis not present

## 2018-10-15 DIAGNOSIS — R5383 Other fatigue: Secondary | ICD-10-CM | POA: Diagnosis not present

## 2018-10-15 DIAGNOSIS — M8588 Other specified disorders of bone density and structure, other site: Secondary | ICD-10-CM | POA: Diagnosis not present

## 2018-10-15 DIAGNOSIS — E039 Hypothyroidism, unspecified: Secondary | ICD-10-CM | POA: Diagnosis not present

## 2018-10-15 DIAGNOSIS — Z79899 Other long term (current) drug therapy: Secondary | ICD-10-CM | POA: Diagnosis not present

## 2018-10-15 DIAGNOSIS — E782 Mixed hyperlipidemia: Secondary | ICD-10-CM | POA: Diagnosis not present

## 2018-10-15 DIAGNOSIS — R5381 Other malaise: Secondary | ICD-10-CM | POA: Diagnosis not present

## 2018-10-21 DIAGNOSIS — K600 Acute anal fissure: Secondary | ICD-10-CM | POA: Diagnosis not present

## 2018-10-21 DIAGNOSIS — K644 Residual hemorrhoidal skin tags: Secondary | ICD-10-CM | POA: Diagnosis not present

## 2018-10-21 DIAGNOSIS — K648 Other hemorrhoids: Secondary | ICD-10-CM | POA: Diagnosis not present

## 2018-10-22 ENCOUNTER — Other Ambulatory Visit: Payer: Self-pay

## 2018-10-22 ENCOUNTER — Ambulatory Visit
Admission: RE | Admit: 2018-10-22 | Discharge: 2018-10-22 | Disposition: A | Discharge status: Home / Self Care | Payer: PPO | Source: Ambulatory Visit | Attending: Internal Medicine | Admitting: Internal Medicine

## 2018-10-22 DIAGNOSIS — N200 Calculus of kidney: Secondary | ICD-10-CM | POA: Diagnosis not present

## 2018-10-22 DIAGNOSIS — R1032 Left lower quadrant pain: Secondary | ICD-10-CM | POA: Diagnosis not present

## 2018-10-22 DIAGNOSIS — K4090 Unilateral inguinal hernia, without obstruction or gangrene, not specified as recurrent: Secondary | ICD-10-CM | POA: Diagnosis not present

## 2018-10-22 MED ORDER — IOHEXOL 300 MG/ML  SOLN
80.0000 mL | Freq: Once | INTRAMUSCULAR | Status: AC | PRN
  Administered 2018-10-22: 80 mL via INTRAVENOUS

## 2018-10-22 NOTE — Patient Outreach (Signed)
[facility]) Care Management  10/22/2018  NYLAN NAKATANI 01-04-1951 [id number]    RN Health Coach closing the program.  Patient is transitioning to external program [facility] for continued case management.  Lazaro Arms RN, BSN, [place] Direct Dial:  [phone]  Fax: [phone]

## 2018-10-25 ENCOUNTER — Ambulatory Visit: Payer: Self-pay

## 2018-11-04 DIAGNOSIS — K439 Ventral hernia without obstruction or gangrene: Secondary | ICD-10-CM | POA: Diagnosis not present

## 2018-11-04 DIAGNOSIS — N9089 Other specified noninflammatory disorders of vulva and perineum: Secondary | ICD-10-CM | POA: Diagnosis not present

## 2018-11-10 DIAGNOSIS — M542 Cervicalgia: Secondary | ICD-10-CM | POA: Diagnosis not present

## 2018-11-10 DIAGNOSIS — M47812 Spondylosis without myelopathy or radiculopathy, cervical region: Secondary | ICD-10-CM | POA: Diagnosis not present

## 2018-11-10 DIAGNOSIS — M5412 Radiculopathy, cervical region: Secondary | ICD-10-CM | POA: Diagnosis not present

## 2018-11-18 DIAGNOSIS — R946 Abnormal results of thyroid function studies: Secondary | ICD-10-CM | POA: Diagnosis not present

## 2018-11-18 DIAGNOSIS — D72829 Elevated white blood cell count, unspecified: Secondary | ICD-10-CM | POA: Diagnosis not present

## 2018-12-01 DIAGNOSIS — D1800 Hemangioma unspecified site: Secondary | ICD-10-CM | POA: Diagnosis not present

## 2018-12-01 DIAGNOSIS — D239 Other benign neoplasm of skin, unspecified: Secondary | ICD-10-CM | POA: Diagnosis not present

## 2018-12-01 DIAGNOSIS — L570 Actinic keratosis: Secondary | ICD-10-CM | POA: Diagnosis not present

## 2018-12-01 DIAGNOSIS — L219 Seborrheic dermatitis, unspecified: Secondary | ICD-10-CM | POA: Diagnosis not present

## 2018-12-01 DIAGNOSIS — L814 Other melanin hyperpigmentation: Secondary | ICD-10-CM | POA: Diagnosis not present

## 2018-12-01 DIAGNOSIS — D485 Neoplasm of uncertain behavior of skin: Secondary | ICD-10-CM | POA: Diagnosis not present

## 2018-12-01 DIAGNOSIS — L821 Other seborrheic keratosis: Secondary | ICD-10-CM | POA: Diagnosis not present

## 2018-12-01 DIAGNOSIS — L905 Scar conditions and fibrosis of skin: Secondary | ICD-10-CM | POA: Diagnosis not present

## 2018-12-01 DIAGNOSIS — L659 Nonscarring hair loss, unspecified: Secondary | ICD-10-CM | POA: Diagnosis not present

## 2018-12-02 ENCOUNTER — Ambulatory Visit: Payer: Self-pay | Admitting: General Surgery

## 2018-12-02 DIAGNOSIS — K4090 Unilateral inguinal hernia, without obstruction or gangrene, not specified as recurrent: Secondary | ICD-10-CM | POA: Diagnosis not present

## 2018-12-02 DIAGNOSIS — K648 Other hemorrhoids: Secondary | ICD-10-CM | POA: Diagnosis not present

## 2018-12-02 DIAGNOSIS — K601 Chronic anal fissure: Secondary | ICD-10-CM | POA: Diagnosis not present

## 2018-12-02 NOTE — H&P (Signed)
HISTORY OF PRESENT ILLNESS:    Samantha Booker is a 68 y.o.female patient who comes for 3 main issues.  Patient call for follow-up of anal fissure.  On previous evaluation she had a procedure and a fissure.  At the time, fiber supplementation and water intake with the use of nifedipine cream was recommended.  Patient reports that she has been using the cream and she has been getting better.  Pain does not radiate to other part of the body.  Pain is minimally aggravated by bowel movement.  Denies significant bleeding.  Alleviating factor has been the cream.  Patient also comes because of chronic hemorrhoid issues.  She reports that she has been having problem with hemorrhoids since many years ago.  At the last evaluation since she had the fissure we did not address the internal hemorrhoids.  Today that she is feeling better we will plan to proceed with internal hemorrhoid banding.  Patient also with significant residual external hemorrhoids that are soft.  These are not causing any major issues.  Patient is a come for evaluation of left inguinal hernia.  Patient was being evaluated by gynecology service.  She was found with a right ovarian cyst.  It was recommended by gynecology to remove the right ovarian cyst.  On the CT scan she was found with a left inguinal hernia with large intestine going through the hernia.  There was no sign of small bowel obstruction.  I personally evaluated the images.  Patient has some discomfort on the left groin area.  There is no pain radiation.  There is no alleviating factor.      PAST MEDICAL HISTORY:      Past Medical History:  Diagnosis Date  . Bleeding hemorrhoid   . Chickenpox   . DDD (degenerative disc disease), cervical   . Hyperlipidemia   . Hypothyroidism   . Kidney stones         PAST SURGICAL HISTORY:        Past Surgical History:  Procedure Laterality Date  . BLADDER SURGERY    . CHOLECYSTECTOMY    . HYSTERECTOMY  1979  . KNEE  ARTHROSCOPY Right   . LENS EYE SURGERY Bilateral          MEDICATIONS:  EncounterMedications        Outpatient Encounter Medications as of 12/02/2018  Medication Sig Dispense Refill  . acyclovir (ZOVIRAX) 5 % cream APPLY TO AFFECTED AREA EVERY 4 HOURS PRN    . aspirin 81 MG EC tablet Take 1 tablet by mouth once daily.    Marland Kitchen atorvastatin (LIPITOR) 40 MG tablet Take 1 tablet (40 mg total) by mouth once daily 90 tablet 3  . B INFANTIS/B ANI/B LON/B BIFID (PROBIOTIC 4X ORAL) Take 1 tablet by mouth once daily.    . B-complex with vitamin C TbER Take by mouth    . BIOTIN ORAL Take by mouth.    . cholecalciferol (VITAMIN D3) 1,000 unit tablet Take by mouth Take 1,000 Units by mouth daily.    . clobetasol (CORMAX) 0.05 % external solution     . Compound Medication Med Name: Nifedipine 0.3% plus lidocaine 2% cream. Apply to anal area two times a day and after every bowel movement. 30 each 0  . estrogens, conjugated, (PREMARIN) 0.45 MG tablet TAKE 1 TABLET BY MOUTH ONCE A DAY    . hydrocortisone (ANUSOL-HC) 2.5 % rectal cream Apply topically 3 (three) times daily APPLY RECTALLY AS DIRECTED THREE TIMES DAILY 30 g 5  .  levothyroxine (SYNTHROID) 112 MCG tablet Take 1 tablet (112 mcg total) by mouth once daily Take on an empty stomach with a glass of water at least 30-60 minutes before breakfast. 30 tablet 5  . meloxicam (MOBIC) 15 MG tablet Take by mouth Take 1 tablet (15 mg total) by mouth daily as needed.    . metroNIDAZOLE (METROCREAM) 0.75 % cream Apply topically once daily Facial cream 45 g 5   No facility-administered encounter medications on file as of 12/02/2018.        ALLERGIES:   Penicillins   SOCIAL HISTORY:  Social History          Socioeconomic History  . Marital status: Married    Spouse name: Not on file  . Number of children: Not on file  . Years of education: Not on file  . Highest education level: Not on file  Occupational History  . Occupation:  Retired  Scientific laboratory technician  . Financial resource strain: Not on file  . Food insecurity:    Worry: Not on file    Inability: Not on file  . Transportation needs:    Medical: Not on file    Non-medical: Not on file  Tobacco Use  . Smoking status: Former Research scientist (life sciences)  . Smokeless tobacco: Never Used  Substance and Sexual Activity  . Alcohol use: Yes    Comment: moderate  . Drug use: No  . Sexual activity: Not Currently    Partners: Male    Birth control/protection: Surgical    Comment: Husband  Other Topics Concern  . Not on file  Social History Narrative  . Not on file      FAMILY HISTORY:       Family History  Problem Relation Age of Onset  . High blood pressure (Hypertension) Mother   . Hyperlipidemia (Elevated cholesterol) Mother   . COPD Mother   . Myocardial Infarction (Heart attack) Mother   . Diabetes Mother   . Myocardial Infarction (Heart attack) Father      GENERAL REVIEW OF SYSTEMS:   General ROS: negative for - chills, fatigue, fever, weight gain or weight loss Allergy and Immunology ROS: negative for - hives  Hematological and Lymphatic ROS: negative for - bleeding problems or bruising, negative for palpable nodes Endocrine ROS: negative for - heat or cold intolerance, hair changes Respiratory ROS: negative for - cough, shortness of breath or wheezing Cardiovascular ROS: no chest pain or palpitations GI ROS: negative for nausea, vomiting, abdominal pain, diarrhea, positive for constipation Musculoskeletal ROS: negative for - joint swelling or muscle pain Neurological ROS: negative for - confusion, syncope Dermatological ROS: negative for pruritus and rash  PHYSICAL EXAM:     Vitals:   12/02/18 0837  BP: 162/80  Pulse: 63  .  Ht:158.8 cm (5' 2.5") Wt:49.9 kg (110 lb) MVE:HMCN surface area is 1.48 meters squared. Body mass index is 19.8 kg/m.Marland Kitchen   GENERAL: Alert, active, oriented x3  HEENT: Pupils equal reactive to light.  Extraocular movements are intact. Sclera clear. Palpebral conjunctiva normal red color.Pharynx clear.  NECK: Supple with no palpable mass and no adenopathy.  LUNGS: Sound clear with no rales rhonchi or wheezes.  HEART: Regular rhythm S1 and S2 without murmur.  ABDOMEN: Soft and depressible, nontender with no palpable mass, no hepatomegaly.   RECTAL: There is improved healing anal fissure.  There are soft external hemorrhoids without problems.  There was 1 medium sized grade 2 internal hemorrhoid.  EXTREMITIES: Well-developed well-nourished symmetrical with no dependent edema.  NEUROLOGICAL: Awake alert oriented, facial expression symmetrical, moving all extremities.      IMPRESSION:     Hemorrhoids, internal, with bleeding [K64.8] -Patient with chronic history of internal hemorrhoid with bleeding.  Has been improving with conservative management with fiber and water. -Still with persistent grade 2 left lateral internal hemorrhoid.  Banded today -The external hemorrhoids are soft.  Chronic anal fissure Patient has been improving with nifedipine cream.  Currently no pain.  On physical exam fissure looks adequately healing.  Left inguinal hernia -Patient had a symptomatic left inguinal hernia, reducible. -CT scan shows large intestine in the groin hernia without sign of obstruction.  I personally elevated the images. -Since patient has had a 3 cm right ovarian cyst that was recommended to be removed, it was discussed with gynecologist to have a combined procedure so the patient can save anesthesia. -I discussed with the patient the benefit and risks of the surgery.  Risk discussed where intestinal injury, ureter injury, bleeding, infection, bowel obstruction, recurrence, bladder injury, among others. -Since this is a combined procedure I discussed with the patient that I will wait for the ovarian cyst to be removed before repairing the hernia since I am going to put a mesh.  If  there is any difficulty during the excision of the cyst like spillage or bleeding or any situation that can put the mesh at risk of infection I will abort the repair of the hernia to decrease the risk of mesh infection.  Patient understand that if this happens we will need to repair the hernia in a later date.  I also discussed with the patient that due to previous pelvic surgeries if the hernia cannot be repaired laparoscopically I will convert surgery to open left inguinal hernia repair.  She understands all this discussion and agreed to proceed with both procedures.  Dr. Leonides Schanz came to the office to discuss her oophorectomy surgery.          PLAN:  1. Continue fiber supplementation on diet 2. Adequate fluid intake (6-8 glasses of water a day) 3. Avoid spicy and fatty food 4. Warm baths (Sitz bath) 2 or 3 times a day may help with symptoms 5. Avoid sitting on toilet for a prolonged time 6. Go to the bathroom only when have the urge to have the bowel movement 7. Expect bleeding with stool in the next few days 8. Avoid constipation or diarrhea (may use stool softener)  9. Laparoscopic [phone]) vs open ([id number]) left inguinal hernia repair with mesh in combined procedure with Dr. Leonides Schanz (Right oophorectomy) 10. CBC, CMP done 11. Avoid aspirin 5 days before surgery 12. Contact us if has any question or concern.   Patient and her husband verbalized understanding, all questions were answered, and were agreeable with the plan outlined above.   This encounter was more than 45 minutes most of the time counseling the patient about the surgical procedure and coordinating plan of care.  Herbert Pun, MD  Electronically signed by Herbert Pun, MD

## 2018-12-02 NOTE — H&P (View-Only) (Signed)
HISTORY OF PRESENT ILLNESS:    Ms. Samantha Booker is a 68 y.o.female patient who comes for 3 main issues.  Patient call for follow-up of anal fissure.  On previous evaluation she had a procedure and a fissure.  At the time, fiber supplementation and water intake with the use of nifedipine cream was recommended.  Patient reports that she has been using the cream and she has been getting better.  Pain does not radiate to other part of the body.  Pain is minimally aggravated by bowel movement.  Denies significant bleeding.  Alleviating factor has been the cream.  Patient also comes because of chronic hemorrhoid issues.  She reports that she has been having problem with hemorrhoids since many years ago.  At the last evaluation since she had the fissure we did not address the internal hemorrhoids.  Today that she is feeling better we will plan to proceed with internal hemorrhoid banding.  Patient also with significant residual external hemorrhoids that are soft.  These are not causing any major issues.  Patient is a come for evaluation of left inguinal hernia.  Patient was being evaluated by gynecology service.  She was found with a right ovarian cyst.  It was recommended by gynecology to remove the right ovarian cyst.  On the CT scan she was found with a left inguinal hernia with large intestine going through the hernia.  There was no sign of small bowel obstruction.  I personally evaluated the images.  Patient has some discomfort on the left groin area.  There is no pain radiation.  There is no alleviating factor.      PAST MEDICAL HISTORY:      Past Medical History:  Diagnosis Date  . Bleeding hemorrhoid   . Chickenpox   . DDD (degenerative disc disease), cervical   . Hyperlipidemia   . Hypothyroidism   . Kidney stones         PAST SURGICAL HISTORY:        Past Surgical History:  Procedure Laterality Date  . BLADDER SURGERY    . CHOLECYSTECTOMY    . HYSTERECTOMY  1979  . KNEE  ARTHROSCOPY Right   . LENS EYE SURGERY Bilateral          MEDICATIONS:  EncounterMedications        Outpatient Encounter Medications as of 12/02/2018  Medication Sig Dispense Refill  . acyclovir (ZOVIRAX) 5 % cream APPLY TO AFFECTED AREA EVERY 4 HOURS PRN    . aspirin 81 MG EC tablet Take 1 tablet by mouth once daily.    Marland Kitchen atorvastatin (LIPITOR) 40 MG tablet Take 1 tablet (40 mg total) by mouth once daily 90 tablet 3  . B INFANTIS/B ANI/B LON/B BIFID (PROBIOTIC 4X ORAL) Take 1 tablet by mouth once daily.    . B-complex with vitamin C TbER Take by mouth    . BIOTIN ORAL Take by mouth.    . cholecalciferol (VITAMIN D3) 1,000 unit tablet Take by mouth Take 1,000 Units by mouth daily.    . clobetasol (CORMAX) 0.05 % external solution     . Compound Medication Med Name: Nifedipine 0.3% plus lidocaine 2% cream. Apply to anal area two times a day and after every bowel movement. 30 each 0  . estrogens, conjugated, (PREMARIN) 0.45 MG tablet TAKE 1 TABLET BY MOUTH ONCE A DAY    . hydrocortisone (ANUSOL-HC) 2.5 % rectal cream Apply topically 3 (three) times daily APPLY RECTALLY AS DIRECTED THREE TIMES DAILY 30 g 5  .  levothyroxine (SYNTHROID) 112 MCG tablet Take 1 tablet (112 mcg total) by mouth once daily Take on an empty stomach with a glass of water at least 30-60 minutes before breakfast. 30 tablet 5  . meloxicam (MOBIC) 15 MG tablet Take by mouth Take 1 tablet (15 mg total) by mouth daily as needed.    . metroNIDAZOLE (METROCREAM) 0.75 % cream Apply topically once daily Facial cream 45 g 5   No facility-administered encounter medications on file as of 12/02/2018.        ALLERGIES:   Penicillins   SOCIAL HISTORY:  Social History          Socioeconomic History  . Marital status: Married    Spouse name: Not on file  . Number of children: Not on file  . Years of education: Not on file  . Highest education level: Not on file  Occupational History  . Occupation:  Retired  Scientific laboratory technician  . Financial resource strain: Not on file  . Food insecurity:    Worry: Not on file    Inability: Not on file  . Transportation needs:    Medical: Not on file    Non-medical: Not on file  Tobacco Use  . Smoking status: Former Research scientist (life sciences)  . Smokeless tobacco: Never Used  Substance and Sexual Activity  . Alcohol use: Yes    Comment: moderate  . Drug use: No  . Sexual activity: Not Currently    Partners: Male    Birth control/protection: Surgical    Comment: Husband  Other Topics Concern  . Not on file  Social History Narrative  . Not on file      FAMILY HISTORY:       Family History  Problem Relation Age of Onset  . High blood pressure (Hypertension) Mother   . Hyperlipidemia (Elevated cholesterol) Mother   . COPD Mother   . Myocardial Infarction (Heart attack) Mother   . Diabetes Mother   . Myocardial Infarction (Heart attack) Father      GENERAL REVIEW OF SYSTEMS:   General ROS: negative for - chills, fatigue, fever, weight gain or weight loss Allergy and Immunology ROS: negative for - hives  Hematological and Lymphatic ROS: negative for - bleeding problems or bruising, negative for palpable nodes Endocrine ROS: negative for - heat or cold intolerance, hair changes Respiratory ROS: negative for - cough, shortness of breath or wheezing Cardiovascular ROS: no chest pain or palpitations GI ROS: negative for nausea, vomiting, abdominal pain, diarrhea, positive for constipation Musculoskeletal ROS: negative for - joint swelling or muscle pain Neurological ROS: negative for - confusion, syncope Dermatological ROS: negative for pruritus and rash  PHYSICAL EXAM:     Vitals:   12/02/18 0837  BP: 162/80  Pulse: 63  .  Ht:158.8 cm (5' 2.5") Wt:49.9 kg (110 lb) MWN:UUVO surface area is 1.48 meters squared. Body mass index is 19.8 kg/m.Marland Kitchen   GENERAL: Alert, active, oriented x3  HEENT: Pupils equal reactive to light.  Extraocular movements are intact. Sclera clear. Palpebral conjunctiva normal red color.Pharynx clear.  NECK: Supple with no palpable mass and no adenopathy.  LUNGS: Sound clear with no rales rhonchi or wheezes.  HEART: Regular rhythm S1 and S2 without murmur.  ABDOMEN: Soft and depressible, nontender with no palpable mass, no hepatomegaly.   RECTAL: There is improved healing anal fissure.  There are soft external hemorrhoids without problems.  There was 1 medium sized grade 2 internal hemorrhoid.  EXTREMITIES: Well-developed well-nourished symmetrical with no dependent edema.  NEUROLOGICAL: Awake alert oriented, facial expression symmetrical, moving all extremities.      IMPRESSION:     Hemorrhoids, internal, with bleeding [K64.8] -Patient with chronic history of internal hemorrhoid with bleeding.  Has been improving with conservative management with fiber and water. -Still with persistent grade 2 left lateral internal hemorrhoid.  Banded today -The external hemorrhoids are soft.  Chronic anal fissure Patient has been improving with nifedipine cream.  Currently no pain.  On physical exam fissure looks adequately healing.  Left inguinal hernia -Patient had a symptomatic left inguinal hernia, reducible. -CT scan shows large intestine in the groin hernia without sign of obstruction.  I personally elevated the images. -Since patient has had a 3 cm right ovarian cyst that was recommended to be removed, it was discussed with gynecologist to have a combined procedure so the patient can save anesthesia. -I discussed with the patient the benefit and risks of the surgery.  Risk discussed where intestinal injury, ureter injury, bleeding, infection, bowel obstruction, recurrence, bladder injury, among others. -Since this is a combined procedure I discussed with the patient that I will wait for the ovarian cyst to be removed before repairing the hernia since I am going to put a mesh.  If  there is any difficulty during the excision of the cyst like spillage or bleeding or any situation that can put the mesh at risk of infection I will abort the repair of the hernia to decrease the risk of mesh infection.  Patient understand that if this happens we will need to repair the hernia in a later date.  I also discussed with the patient that due to previous pelvic surgeries if the hernia cannot be repaired laparoscopically I will convert surgery to open left inguinal hernia repair.  She understands all this discussion and agreed to proceed with both procedures.  Dr. Leonides Schanz came to the office to discuss her oophorectomy surgery.          PLAN:  1. Continue fiber supplementation on diet 2. Adequate fluid intake (6-8 glasses of water a day) 3. Avoid spicy and fatty food 4. Warm baths (Sitz bath) 2 or 3 times a day may help with symptoms 5. Avoid sitting on toilet for a prolonged time 6. Go to the bathroom only when have the urge to have the bowel movement 7. Expect bleeding with stool in the next few days 8. Avoid constipation or diarrhea (may use stool softener)  9. Laparoscopic [phone]) vs open (02725) left inguinal hernia repair with mesh in combined procedure with Dr. Leonides Schanz (Right oophorectomy) 10. CBC, CMP done 11. Avoid aspirin 5 days before surgery 12. Contact us if has any question or concern.   Patient and her husband verbalized understanding, all questions were answered, and were agreeable with the plan outlined above.   This encounter was more than 45 minutes most of the time counseling the patient about the surgical procedure and coordinating plan of care.  Herbert Pun, MD  Electronically signed by Herbert Pun, MD

## 2018-12-09 ENCOUNTER — Other Ambulatory Visit: Payer: Self-pay

## 2018-12-09 ENCOUNTER — Encounter
Admission: RE | Admit: 2018-12-09 | Discharge: 2018-12-09 | Disposition: A | Discharge status: Home / Self Care | Payer: PPO | Source: Ambulatory Visit | Attending: General Surgery | Admitting: General Surgery

## 2018-12-09 HISTORY — DX: Personal history of urinary calculi: Z87.442

## 2018-12-09 HISTORY — DX: Hypothyroidism, unspecified: E03.9

## 2018-12-09 HISTORY — DX: Other cervical disc degeneration, unspecified cervical region: M50.30

## 2018-12-09 NOTE — Patient Instructions (Signed)
Your procedure is scheduled on: 12-15-18 [facility] Report to Same Day Surgery 2nd floor medical mall [facility] Entrance-take elevator on left to 2nd floor.  Check in with surgery information desk.) To find out your arrival time please call [phone] between 1PM - 3PM on 12-14-18 TUESDAY  Remember: Instructions that are not followed completely may result in serious medical risk, up to and including death, or upon the discretion of your surgeon and anesthesiologist your surgery may need to be rescheduled.    _x___ 1. Do not eat food after midnight the night before your procedure. NO GUM OR CANDY AFTER MIDNIGHT.  You may drink clear liquids up to 2 hours before you are scheduled to arrive at the hospital for your procedure.  Do not drink clear liquids within 2 hours of your scheduled arrival to the hospital.  Clear liquids include  --Water or Apple juice without pulp  --Clear carbohydrate beverage such as ClearFast or Gatorade  --Black Coffee or Clear Tea (No milk, no creamers, do not add anything to the coffee or Tea   ____Ensure clear carbohydrate drink on the way to the hospital for bariatric patients  ____Ensure clear carbohydrate drink 3 hours before surgery.     __x__ 2. No Alcohol for 24 hours before or after surgery.   __x__3. No Smoking or e-cigarettes for 24 prior to surgery.  Do not use any chewable tobacco products for at least 6 hour prior to surgery   ____  4. Bring all medications with you on the day of surgery if instructed.    __x__ 5. Notify your doctor if there is any change in your medical condition     (cold, fever, infections).    x___6. On the morning of surgery brush your teeth with toothpaste and water.  You may rinse your mouth with mouth wash if you wish.  Do not swallow any toothpaste or mouthwash.   Do not wear jewelry, make-up, hairpins, clips or nail polish.  Do not wear lotions, powders, or perfumes. You may wear deodorant.  Do not shave 48 hours  prior to surgery. Men may shave face and neck.  Do not bring valuables to the hospital.    [facility] is not responsible for any belongings or valuables.               Contacts, dentures or bridgework may not be worn into surgery.  Leave your suitcase in the car. After surgery it may be brought to your room.  For patients admitted to the hospital, discharge time is determined by your  treatment team.  _  Patients discharged the day of surgery will not be allowed to drive home.  You will need someone to drive you home and stay with you the night of your procedure.    Please read over the following fact sheets that you were given:    [facility] Preparing for Surgery   _x___ TAKE THE FOLLOWING MEDICATION THE MORNING OF SURGERY WITH A SMALL SIP OF WATER. These include:  1. LEVOTHYROXINE (SYNTHROID)  2. LIPITOR (ATORVASTATIN)  3.  4.  5.  6.  ____Fleets enema or Magnesium Citrate as directed.   _x___ Use CHG Soap or sage wipes as directed on instruction sheet   ____ Use inhalers on the day of surgery and bring to hospital day of surgery  ____ Stop Metformin and Janumet 2 days prior to surgery.    ____ Take 1/2 of usual insulin dose the night before surgery and  none on the morning surgery.   _x___ Follow recommendations from Cardiologist, Pulmonologist or PCP regarding stopping Aspirin, Coumadin, Plavix ,Eliquis, Effient, or Pradaxa, and Pletal-PT STOPPED ASPIRIN ON 12-07-18   X____Stop Anti-inflammatories such as Advil, Aleve, Ibuprofen, Motrin, Naproxen, MOBIC (MELOXICAM) Naprosyn, Goodies powders or aspirin products NOW- OK to take Tylenol    _x___ Stop supplements until after surgery-STOP BIOTIN NOW-MAY RESUME AFTER SURGERY   ____ Bring C-Pap to the hospital.

## 2018-12-10 ENCOUNTER — Other Ambulatory Visit: Payer: Self-pay

## 2018-12-10 ENCOUNTER — Encounter
Admission: RE | Admit: 2018-12-10 | Discharge: 2018-12-10 | Disposition: A | Discharge status: Home / Self Care | Payer: PPO | Source: Ambulatory Visit | Attending: General Surgery | Admitting: General Surgery

## 2018-12-10 ENCOUNTER — Inpatient Hospital Stay: Admission: RE | Admit: 2018-12-10 | Payer: PPO | Source: Ambulatory Visit

## 2018-12-10 DIAGNOSIS — Z01818 Encounter for other preprocedural examination: Secondary | ICD-10-CM | POA: Diagnosis not present

## 2018-12-10 DIAGNOSIS — Z20828 Contact with and (suspected) exposure to other viral communicable diseases: Secondary | ICD-10-CM | POA: Insufficient documentation

## 2018-12-10 LAB — SARS CORONAVIRUS 2 (TAT 6-24 HRS): SARS Coronavirus 2: NEGATIVE

## 2018-12-10 LAB — BASIC METABOLIC PANEL
Anion gap: 8 (ref 5–15)
BUN: 13 mg/dL (ref 8–23)
CO2: 26 mmol/L (ref 22–32)
Calcium: 9.6 mg/dL (ref 8.9–10.3)
Chloride: 106 mmol/L (ref 98–111)
Creatinine, Ser: 0.46 mg/dL (ref 0.44–1.00)
GFR calc Af Amer: >60 mL/min (ref >60)
GFR calc non Af Amer: >60 mL/min (ref >60)
Glucose, Bld: 93 mg/dL (ref 70–99)
Potassium: 4.2 mmol/L (ref 3.5–5.1)
Sodium: 140 mmol/L (ref 135–145)

## 2018-12-10 LAB — TYPE AND SCREEN
ABO/RH(D): O POS
Antibody Screen: NEGATIVE

## 2018-12-14 MED ORDER — CLINDAMYCIN PHOSPHATE 900 MG/50ML IV SOLN
900.0000 mg | INTRAVENOUS | Status: AC
  Administered 2018-12-15: 900 mg via INTRAVENOUS

## 2018-12-15 ENCOUNTER — Ambulatory Visit: Payer: PPO | Admitting: Certified Registered"

## 2018-12-15 ENCOUNTER — Encounter: Payer: Self-pay | Admitting: Emergency Medicine

## 2018-12-15 ENCOUNTER — Ambulatory Visit
Admission: RE | Admit: 2018-12-15 | Discharge: 2018-12-15 | Disposition: A | Discharge status: Home / Self Care | Payer: PPO | Attending: General Surgery | Admitting: General Surgery

## 2018-12-15 ENCOUNTER — Other Ambulatory Visit: Payer: Self-pay

## 2018-12-15 ENCOUNTER — Encounter
Admission: RE | Disposition: A | Discharge status: Home / Self Care | Payer: Self-pay | Source: Home / Self Care | Attending: General Surgery

## 2018-12-15 DIAGNOSIS — Z87891 Personal history of nicotine dependence: Secondary | ICD-10-CM | POA: Insufficient documentation

## 2018-12-15 DIAGNOSIS — Z880 Allergy status to penicillin: Secondary | ICD-10-CM | POA: Insufficient documentation

## 2018-12-15 DIAGNOSIS — Z7982 Long term (current) use of aspirin: Secondary | ICD-10-CM | POA: Diagnosis not present

## 2018-12-15 DIAGNOSIS — N838 Other noninflammatory disorders of ovary, fallopian tube and broad ligament: Secondary | ICD-10-CM | POA: Diagnosis not present

## 2018-12-15 DIAGNOSIS — R102 Pelvic and perineal pain: Secondary | ICD-10-CM | POA: Diagnosis present

## 2018-12-15 DIAGNOSIS — M1990 Unspecified osteoarthritis, unspecified site: Secondary | ICD-10-CM | POA: Insufficient documentation

## 2018-12-15 DIAGNOSIS — K4090 Unilateral inguinal hernia, without obstruction or gangrene, not specified as recurrent: Secondary | ICD-10-CM | POA: Diagnosis not present

## 2018-12-15 DIAGNOSIS — Z79899 Other long term (current) drug therapy: Secondary | ICD-10-CM | POA: Diagnosis not present

## 2018-12-15 DIAGNOSIS — K439 Ventral hernia without obstruction or gangrene: Secondary | ICD-10-CM | POA: Diagnosis present

## 2018-12-15 DIAGNOSIS — Z79890 Hormone replacement therapy (postmenopausal): Secondary | ICD-10-CM | POA: Insufficient documentation

## 2018-12-15 DIAGNOSIS — E785 Hyperlipidemia, unspecified: Secondary | ICD-10-CM | POA: Insufficient documentation

## 2018-12-15 DIAGNOSIS — N83209 Unspecified ovarian cyst, unspecified side: Secondary | ICD-10-CM | POA: Diagnosis present

## 2018-12-15 DIAGNOSIS — N83291 Other ovarian cyst, right side: Secondary | ICD-10-CM | POA: Diagnosis not present

## 2018-12-15 DIAGNOSIS — E039 Hypothyroidism, unspecified: Secondary | ICD-10-CM | POA: Diagnosis not present

## 2018-12-15 DIAGNOSIS — N83201 Unspecified ovarian cyst, right side: Secondary | ICD-10-CM | POA: Diagnosis not present

## 2018-12-15 HISTORY — PX: INGUINAL HERNIA REPAIR: SHX194

## 2018-12-15 HISTORY — PX: LAPAROSCOPIC UNILATERAL SALPINGO OOPHERECTOMY: SHX5935

## 2018-12-15 LAB — ABO/RH: ABO/RH(D): O POS

## 2018-12-15 SURGERY — REPAIR, HERNIA, INGUINAL, LAPAROSCOPIC
Anesthesia: General | Laterality: Right

## 2018-12-15 MED ORDER — PROMETHAZINE HCL 25 MG/ML IJ SOLN: 6.2500 mg | Freq: Once | INTRAMUSCULAR | Status: DC

## 2018-12-15 MED ORDER — LACTATED RINGERS IV SOLN
INTRAVENOUS | Status: DC
  Administered 2018-12-15 (×2): via INTRAVENOUS

## 2018-12-15 MED ORDER — CLINDAMYCIN PHOSPHATE 900 MG/50ML IV SOLN
INTRAVENOUS | Status: AC
  Filled 2018-12-15: qty 50

## 2018-12-15 MED ORDER — GLYCOPYRROLATE 0.2 MG/ML IJ SOLN
INTRAMUSCULAR | Status: DC | PRN
  Administered 2018-12-15: 0.2 mg via INTRAVENOUS

## 2018-12-15 MED ORDER — DEXAMETHASONE SODIUM PHOSPHATE 10 MG/ML IJ SOLN
INTRAMUSCULAR | Status: DC | PRN
  Administered 2018-12-15: 10 mg via INTRAVENOUS

## 2018-12-15 MED ORDER — DEXAMETHASONE SODIUM PHOSPHATE 10 MG/ML IJ SOLN
INTRAMUSCULAR | Status: AC
  Filled 2018-12-15: qty 1

## 2018-12-15 MED ORDER — FENTANYL CITRATE (PF) 100 MCG/2ML IJ SOLN: 25.0000 ug | INTRAMUSCULAR | Status: DC | PRN

## 2018-12-15 MED ORDER — MIDAZOLAM HCL 2 MG/2ML IJ SOLN
INTRAMUSCULAR | Status: DC | PRN
  Administered 2018-12-15: 2 mg via INTRAVENOUS

## 2018-12-15 MED ORDER — HYDROCODONE-ACETAMINOPHEN 5-325 MG PO TABS: 1.0000 | ORAL_TABLET | ORAL | 0 refills | Status: AC | PRN

## 2018-12-15 MED ORDER — ONDANSETRON HCL 4 MG/2ML IJ SOLN
INTRAMUSCULAR | Status: DC | PRN
  Administered 2018-12-15 (×2): 4 mg via INTRAVENOUS

## 2018-12-15 MED ORDER — HEPARIN SODIUM (PORCINE) 5000 UNIT/ML IJ SOLN
INTRAMUSCULAR | Status: AC
  Administered 2018-12-15: 08:00:00 5000 [IU] via SUBCUTANEOUS
  Filled 2018-12-15: qty 1

## 2018-12-15 MED ORDER — GABAPENTIN 300 MG PO CAPS
ORAL_CAPSULE | ORAL | Status: AC
  Administered 2018-12-15: 600 mg via ORAL
  Filled 2018-12-15: qty 2

## 2018-12-15 MED ORDER — GABAPENTIN 300 MG PO CAPS
600.0000 mg | ORAL_CAPSULE | Freq: Once | ORAL | Status: AC
  Administered 2018-12-15: 08:00:00 600 mg via ORAL

## 2018-12-15 MED ORDER — ACETAMINOPHEN 500 MG PO TABS
1000.0000 mg | ORAL_TABLET | Freq: Once | ORAL | Status: AC
  Administered 2018-12-15: 07:00:00 1000 mg via ORAL

## 2018-12-15 MED ORDER — LIDOCAINE HCL (CARDIAC) PF 100 MG/5ML IV SOSY
PREFILLED_SYRINGE | INTRAVENOUS | Status: DC | PRN
  Administered 2018-12-15: 60 mg via INTRAVENOUS

## 2018-12-15 MED ORDER — HYDROMORPHONE HCL 1 MG/ML IJ SOLN
INTRAMUSCULAR | Status: DC | PRN
  Administered 2018-12-15 (×2): 0.5 mg via INTRAVENOUS

## 2018-12-15 MED ORDER — PHENYLEPHRINE HCL (PRESSORS) 10 MG/ML IV SOLN
INTRAVENOUS | Status: DC | PRN
  Administered 2018-12-15 (×2): 100 ug via INTRAVENOUS

## 2018-12-15 MED ORDER — ONDANSETRON HCL 4 MG/2ML IJ SOLN
INTRAMUSCULAR | Status: AC
  Filled 2018-12-15: qty 4

## 2018-12-15 MED ORDER — SODIUM CHLORIDE FLUSH 0.9 % IV SOLN
INTRAVENOUS | Status: AC
  Filled 2018-12-15: qty 10

## 2018-12-15 MED ORDER — SUGAMMADEX SODIUM 200 MG/2ML IV SOLN
INTRAVENOUS | Status: AC
  Filled 2018-12-15: qty 2

## 2018-12-15 MED ORDER — FENTANYL CITRATE (PF) 100 MCG/2ML IJ SOLN
INTRAMUSCULAR | Status: AC
  Filled 2018-12-15: qty 4

## 2018-12-15 MED ORDER — PHENYLEPHRINE HCL (PRESSORS) 10 MG/ML IV SOLN
INTRAVENOUS | Status: AC
  Filled 2018-12-15: qty 1

## 2018-12-15 MED ORDER — BUPIVACAINE-EPINEPHRINE (PF) 0.5% -1:200000 IJ SOLN
INTRAMUSCULAR | Status: AC
  Filled 2018-12-15: qty 30

## 2018-12-15 MED ORDER — FAMOTIDINE 20 MG PO TABS: 20.0000 mg | ORAL_TABLET | Freq: Once | ORAL | Status: DC

## 2018-12-15 MED ORDER — HYDROMORPHONE HCL 1 MG/ML IJ SOLN
INTRAMUSCULAR | Status: AC
  Filled 2018-12-15: qty 2

## 2018-12-15 MED ORDER — CELECOXIB 200 MG PO CAPS
ORAL_CAPSULE | ORAL | Status: AC
  Administered 2018-12-15: 07:00:00 400 mg via ORAL
  Filled 2018-12-15: qty 2

## 2018-12-15 MED ORDER — CELECOXIB 200 MG PO CAPS
400.0000 mg | ORAL_CAPSULE | Freq: Once | ORAL | Status: AC
  Administered 2018-12-15: 07:00:00 400 mg via ORAL

## 2018-12-15 MED ORDER — FENTANYL CITRATE (PF) 100 MCG/2ML IJ SOLN
INTRAMUSCULAR | Status: DC | PRN
  Administered 2018-12-15 (×2): 50 ug via INTRAVENOUS

## 2018-12-15 MED ORDER — PROMETHAZINE HCL 25 MG/ML IJ SOLN
INTRAMUSCULAR | Status: AC
  Filled 2018-12-15: qty 1

## 2018-12-15 MED ORDER — SUGAMMADEX SODIUM 200 MG/2ML IV SOLN
INTRAVENOUS | Status: DC | PRN
  Administered 2018-12-15: 120 mg via INTRAVENOUS

## 2018-12-15 MED ORDER — GLYCOPYRROLATE 0.2 MG/ML IJ SOLN
INTRAMUSCULAR | Status: AC
  Filled 2018-12-15: qty 1

## 2018-12-15 MED ORDER — HEPARIN SODIUM (PORCINE) 5000 UNIT/ML IJ SOLN
5000.0000 [IU] | Freq: Once | INTRAMUSCULAR | Status: AC
  Administered 2018-12-15: 08:00:00 5000 [IU] via SUBCUTANEOUS

## 2018-12-15 MED ORDER — ACETAMINOPHEN 500 MG PO TABS
ORAL_TABLET | ORAL | Status: AC
  Administered 2018-12-15: 07:00:00 1000 mg via ORAL
  Filled 2018-12-15: qty 2

## 2018-12-15 MED ORDER — OXYCODONE HCL 5 MG/5ML PO SOLN: 5.0000 mg | Freq: Once | ORAL | Status: DC | PRN

## 2018-12-15 MED ORDER — ROCURONIUM BROMIDE 100 MG/10ML IV SOLN
INTRAVENOUS | Status: DC | PRN
  Administered 2018-12-15: 50 mg via INTRAVENOUS

## 2018-12-15 MED ORDER — MIDAZOLAM HCL 2 MG/2ML IJ SOLN
INTRAMUSCULAR | Status: AC
  Filled 2018-12-15: qty 2

## 2018-12-15 MED ORDER — OXYCODONE HCL 5 MG PO TABS: 5.0000 mg | ORAL_TABLET | Freq: Once | ORAL | Status: DC | PRN

## 2018-12-15 MED ORDER — PROPOFOL 10 MG/ML IV BOLUS
INTRAVENOUS | Status: DC | PRN
  Administered 2018-12-15: 100 mg via INTRAVENOUS

## 2018-12-15 MED ORDER — EPHEDRINE SULFATE 50 MG/ML IJ SOLN
INTRAMUSCULAR | Status: DC | PRN
  Administered 2018-12-15 (×3): 5 mg via INTRAVENOUS

## 2018-12-15 MED ORDER — PROPOFOL 10 MG/ML IV BOLUS
INTRAVENOUS | Status: AC
  Filled 2018-12-15: qty 40

## 2018-12-15 MED ORDER — BUPIVACAINE-EPINEPHRINE (PF) 0.5% -1:200000 IJ SOLN
INTRAMUSCULAR | Status: DC | PRN
  Administered 2018-12-15: 10 mL

## 2018-12-15 SURGICAL SUPPLY — 71 items
BAG URINE DRAINAGE (UROLOGICAL SUPPLIES) IMPLANT
BLADE CLIPPER SURG (BLADE) ×3 IMPLANT
BLADE SURG SZ11 CARB STEEL (BLADE) ×6 IMPLANT
CANISTER SUCT 1200ML W/VALVE (MISCELLANEOUS) ×3 IMPLANT
CATH FOLEY 2WAY  5CC 16FR (CATHETERS)
CATH URTH 16FR FL 2W BLN LF (CATHETERS) IMPLANT
CHLORAPREP W/TINT 26 (MISCELLANEOUS) ×3 IMPLANT
COVER WAND RF STERILE (DRAPES) ×3 IMPLANT
DERMABOND ADVANCED (GAUZE/BANDAGES/DRESSINGS) ×1
DERMABOND ADVANCED .7 DNX12 (GAUZE/BANDAGES/DRESSINGS) ×2 IMPLANT
DEVICE SECURE STRAP 25 ABSORB (INSTRUMENTS) ×3 IMPLANT
DISSECT BALLN SPACEMKR OVL PDB (BALLOONS)
DISSECTOR BALLN SPCMKR OVL PDB (BALLOONS) IMPLANT
DRAPE LEGGINS SURG 28X43 STRL (DRAPES) IMPLANT
DRAPE UNDER BUTTOCK W/FLU (DRAPES) IMPLANT
ELECT REM PT RETURN 9FT ADLT (ELECTROSURGICAL) ×3
ELECTRODE REM PT RTRN 9FT ADLT (ELECTROSURGICAL) ×2 IMPLANT
GAUZE SPONGE 4X4 12PLY STRL (GAUZE/BANDAGES/DRESSINGS) ×3 IMPLANT
GLOVE BIO SURGEON STRL SZ 6.5 (GLOVE) ×3 IMPLANT
GLOVE BIOGEL PI IND STRL 6.5 (GLOVE) ×8 IMPLANT
GLOVE BIOGEL PI INDICATOR 6.5 (GLOVE) ×4
GLOVE PI ORTHOPRO 6.5 (GLOVE) ×1
GLOVE PI ORTHOPRO STRL 6.5 (GLOVE) ×2 IMPLANT
GLOVE SURG SYN 6.5 ES PF (GLOVE) ×3 IMPLANT
GOWN STRL REUS W/ TWL LRG LVL3 (GOWN DISPOSABLE) ×8 IMPLANT
GOWN STRL REUS W/ TWL XL LVL3 (GOWN DISPOSABLE) ×2 IMPLANT
GOWN STRL REUS W/TWL LRG LVL3 (GOWN DISPOSABLE) ×4
GOWN STRL REUS W/TWL XL LVL3 (GOWN DISPOSABLE) ×1
IRRIGATION STRYKERFLOW (MISCELLANEOUS) IMPLANT
IRRIGATOR STRYKERFLOW (MISCELLANEOUS)
IV LACTATED RINGERS 1000ML (IV SOLUTION) IMPLANT
IV NS 1000ML (IV SOLUTION)
IV NS 1000ML BAXH (IV SOLUTION) IMPLANT
JELLY LUB 2OZ STRL (MISCELLANEOUS)
JELLY LUBE 2OZ STRL (MISCELLANEOUS) IMPLANT
KIT PINK PAD W/HEAD ARE REST (MISCELLANEOUS) ×3
KIT PINK PAD W/HEAD ARM REST (MISCELLANEOUS) ×2 IMPLANT
KIT TURNOVER CYSTO (KITS) ×3 IMPLANT
KIT TURNOVER KIT A (KITS) ×3 IMPLANT
KITTNER LAPARASCOPIC 5X40 (MISCELLANEOUS) IMPLANT
L-HOOK LAP DISP 36CM (ELECTROSURGICAL) ×3
LABEL OR SOLS (LABEL) IMPLANT
LHOOK LAP DISP 36CM (ELECTROSURGICAL) ×2 IMPLANT
LIGASURE VESSEL 5MM BLUNT TIP (ELECTROSURGICAL) IMPLANT
MANIPULATOR UTERINE 4.5 ZUMI (MISCELLANEOUS) IMPLANT
MESH 3DMAX 4X6 LT LRG (Mesh General) ×3 IMPLANT
NDL HPO THNWL 1X22GA REG BVL (NEEDLE) ×2 IMPLANT
NEEDLE FILTER BLUNT 18X 1/2SAF (NEEDLE) ×1
NEEDLE FILTER BLUNT 18X1 1/2 (NEEDLE) ×2 IMPLANT
NEEDLE SAFETY 22GX1 (NEEDLE) ×1
NS IRRIG 500ML POUR BTL (IV SOLUTION) ×6 IMPLANT
PACK LAP CHOLECYSTECTOMY (MISCELLANEOUS) ×6 IMPLANT
PAD OB MATERNITY 4.3X12.25 (PERSONAL CARE ITEMS) IMPLANT
PAD PREP 24X41 OB/GYN DISP (PERSONAL CARE ITEMS) IMPLANT
PENCIL ELECTRO HAND CTR (MISCELLANEOUS) ×3 IMPLANT
POUCH SPECIMEN RETRIEVAL 10MM (ENDOMECHANICALS) ×3 IMPLANT
SCISSORS METZENBAUM CVD 33 (INSTRUMENTS) IMPLANT
SET TUBE SMOKE EVAC HIGH FLOW (TUBING) ×3 IMPLANT
SHEARS HARMONIC ACE PLUS 36CM (ENDOMECHANICALS) IMPLANT
SLEEVE ENDOPATH XCEL 5M (ENDOMECHANICALS) ×6 IMPLANT
SUT MNCRL 4-0 (SUTURE) ×1
SUT MNCRL 4-0 27XMFL (SUTURE) ×2
SUT MNCRL AB 4-0 PS2 18 (SUTURE) ×6 IMPLANT
SUT VICRYL 0 AB UR-6 (SUTURE) ×3 IMPLANT
SUTURE MNCRL 4-0 27XMF (SUTURE) ×2 IMPLANT
SYR 5ML LL (SYRINGE) ×3 IMPLANT
TRAY FOLEY MTR SLVR 16FR STAT (SET/KITS/TRAYS/PACK) ×3 IMPLANT
TROCAR 5MM SINGLE VERSAONE (TROCAR) ×6 IMPLANT
TROCAR BALLN 10M OMST10SB SPAC (TROCAR) ×3 IMPLANT
TROCAR ENDO BLADELESS 11MM (ENDOMECHANICALS) ×3 IMPLANT
TROCAR XCEL NON-BLD 5MMX100MML (ENDOMECHANICALS) ×3 IMPLANT

## 2018-12-15 NOTE — Op Note (Addendum)
Preoperative diagnosis: Left inguinal hernia.   Postoperative diagnosis: Left inguinal hernia.  Procedure: Transabdominal preperitoneal laparoscopic (TAPP) repair of left inguinal hernia.  Anesthesia: GETA  Surgeon: Dr. Windell Moment  Assistant Surgeon: Dr. Lysle Pearl  Wound Classification: Clean  Indications:  Patient is a 68 y.o. female developed a symptomatic left inguinal hernia. Repair was indicated. Patient also with ovarian cyst that was removed by GYN during same surgery.   Findings: 1. Indirect Inguinal hernia identified 2. Round ligament identified and preserved 3. Large Bard 3D Max mesh used for repair 4. Adequate hemostasis.   Description of procedure: The patient was taken to the operating room and the correct side of surgery was verified. The patient was placed supine with arms tucked at the sides. After obtaining adequate anesthesia, the patient's abdomen was prepped and draped in standard sterile fashion. The patient was placed in the Trendelenburg position. A time-out was completed verifying correct patient, procedure, site, positioning, and implant(s) and/or special equipment prior to beginning this procedure. A Veress needle was placed at the umbilicus and pneumoperitoneum created with insufflation of carbon dioxide to 15 mmHg. After the Veress needle was removed, a 11-mm trocar was placed supraumbilically and the 30 angled laparoscope inserted. Two 5-mm trocars were then placed lateral to the rectus sheath under direct visualization. Both inguinal regions were inspected and the median umbilical ligament, medial umbilical ligament, and lateral umbilical fold were identified.  The peritoneum was incised with endoscopic scissors along a line 2 cm above the superior edge of the hernia defect, extending from the median umbilical ligament to the anterior superior iliac spine. The peritoneal flap was mobilized inferiorly using blunt and sharp dissection. The inferior epigastric vessels  were exposed and the pubic symphysis was identified. Cooper's ligament was dissected to its junction with the iliac vein. The dissection was continued inferiorly to the iliopubic tract, with care taken to avoid injury to the femoral branch of the genitofemoral nerve and the lateral femoral cutaneous nerve. The cord structures were parietalized. The hernia was identified and reduced by gentle traction.  The indirect hernia sac was noted mobilized from the cord structures and reduced into the peritoneal cavity. A lipoma of the inguinal canal was identified, reduced and excised.  At this point, Gynecology service came and excised the right ovary.See separate Operation report for details.  After removal of the right ovary and assured that there was no bleeding, spillage or any complication from that procedure it was decided to proceed with the repair of the left inguinal hernia. A large piece of mesh was rolled longitudinally into a compact cylinder and passed through a trocar. The cylinder was placed along the inferior aspect of the working space and unrolled into place to completely cover the direct, indirect, and femoral spaces. The mesh was secured into place superiorly to the anterior abdominal wall and inferiorly and medially to Cooper's ligament with absorbable tacks. Care was taken to avoid the inferolateral triangles containing the iliac vessels and genital nerves. The peritoneal flap was closed over the mesh with 3-0 Vyloc suture. After ensuring adequate hemostasis, the trocars were removed and the pneumoperitoneum allowed to escape. The supra umbilical fascia was closed with 0 Vicryl. The trocar incisions were closed using monocryl and skin adhesive dressings applied.  The patient tolerated the procedure well and was taken to the postanesthesia care unit in stable condition.   Specimen: Inguinal canal lipoma  Complications: None  Estimated Blood Loss: 10 mL

## 2018-12-15 NOTE — Anesthesia Post-op Follow-up Note (Signed)
Anesthesia QCDR form completed.        

## 2018-12-15 NOTE — Anesthesia Procedure Notes (Signed)
Procedure Name: Intubation Date/Time: 12/15/2018 8:49 AM Performed by: Jonna Clark, CRNA Pre-anesthesia Checklist: Patient identified, Patient being monitored, Timeout performed, Emergency Drugs available and Suction available Patient Re-evaluated:Patient Re-evaluated prior to induction Oxygen Delivery Method: Circle system utilized Preoxygenation: Pre-oxygenation with 100% oxygen Induction Type: IV induction Ventilation: Mask ventilation without difficulty Laryngoscope Size: Mac and 3 Grade View: Grade I Tube type: Oral Tube size: 7.0 mm Number of attempts: 1 Airway Equipment and Method: Stylet Placement Confirmation: ETT inserted through vocal cords under direct vision,  positive ETCO2 and breath sounds checked- equal and bilateral Secured at: 21 cm Tube secured with: Tape Dental Injury: Teeth and Oropharynx as per pre-operative assessment

## 2018-12-15 NOTE — Op Note (Signed)
Samantha Booker PROCEDURE DATE: 12/15/2018  PATIENT:  Samantha Booker  68 y.o. female  PRE-OPERATIVE DIAGNOSIS:  K40.90 NON RECURRENT UNILATRL INQUINAL HERNIA W/O OBSTRUCTION OR GANGRENE. N83.20 OVARIAN CYST  POST-OPERATIVE DIAGNOSIS:  K40.90 NON RECURRENT UNILATRL INQUINAL HERNIA W/O OBSTRUCTION OR GANGRENE. N83.20 OVARIAN CYST (RIGHT)  PROCEDURE:  Procedure(s): LAPAROSCOPIC LEFT INGUINAL HERNIA VS OPEN (Left) LAPAROSCOPIC UNILATERAL OOPHORECTOMY (Right)  SURGEON:  Surgeon(s) and Role: Panel 1:    * Herbert Pun, MD - Primary    Lysle Pearl, Robie Ridge, DO Panel 2:    * Jensyn Shave, Honor Loh, MD - Primary  ANESTHESIA:  General via ET  I/O  SEE FLOWSHEET  FINDINGS: RIGHT cystic structure, likely fallopian tube or inclusion cyst and normal appearing ovary.  SPECIMEN: right adnexa  COMPLICATIONS: none apparent  DISPOSITION: vital signs stable to PACU   Indication for Surgery: 68 y.o. [id number] who was having abdominal bloating and pelvic pain was found to have a bowel-containing hernia and an incidental finding of a right cystic adnexal process, that appeared benign.  She is s/p total hysterectomy and left salpingo-oophorectomy.  She was having surgery for the hernia repair and requested her remaining tube and ovary be removed concurrently.   Risks of surgery were discussed with the patient including but not limited to: bleeding which may require transfusion or reoperation; infection which may require antibiotics; injury to bowel, bladder, ureters or other surrounding organs; need for additional procedures including laparotomy, blood clot, incisional problems and other postoperative/anesthesia complications. Written informed consent was obtained.     PROCEDURE IN DETAIL:  The patient had sequential compression devices applied to her lower extremities while in the preoperative area.  She was then taken to the operating room where general anesthesia was administered and was found to be  adequate.  A foley catheter was placed, she remained supine, and she was prepped and draped in a sterile manner.  A surgical timeout was performed, and Dr. Windell Moment started his portion of the case.    Once he had completed the reduction of the hernia, I returned to the OR to perform my portion.  The bowel was retracted, and the right ovary and likely tube were thinly adherent to the peritoneum of the sidewall and bladder.  The IP was visible transperitoneally and the ureter was seen distant to the surgical site.  The round ligament was transected and the peritoneum was divided and retracted medially.  The bladder was pushed away from the adnexa. The IP was skeletonized and the Ligasure was used to thrice cauterize and transect the vessels. The pedicle was hemostatic. The remainder of the tissues were divided with the Ligasure.  An Endopouch was inserted and the ovarian complex/adnexa was retracted out of the abdomen.  My portion of the case was complete and Dr. Windell Moment returned to complete the hernia repair and place the mesh laparoscopically.  Please see his op note for opening and closing notes.  ---- Larey Days, MD Attending Obstetrician and Gynecologist [facility]

## 2018-12-15 NOTE — H&P (Signed)
Preoperative History and Physical  Samantha Booker is a 68 y.o. [id number] here for surgical management of pelvic pain and abdominal bloating.  On CT she was found to have a right 3cm ovarian cyst and ventral hernia with bowel involvement.   No significant preoperative concerns.  Proposed surgery:  Dual surgery blocks: 1. Me: (right?) unilateral oophorectomy, possible salpingectomy if tube is still present. 2. Cintron-Diaz: left (pelvic) ventral hernia repair with placement of mesh  Both of these will be attempted laparoscopically.   Past Medical History:  Diagnosis Date  . Atherosclerosis of aorta ([place]) 06/25/2016   Imaging Feb 2018  . Chicken pox   . DDD (degenerative disc disease), cervical   . 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 History of hepatitis B   . History of kidney stones   . Hyperlipidemia   . Hypothyroidism   . Jaundice   . Thyroid disease    Past Surgical History:  Procedure Laterality Date  . ABDOMINAL HYSTERECTOMY  1980   precancerous cells  . APPENDECTOMY  1979  . CATARACT EXTRACTION Bilateral 2019  . CHOLECYSTECTOMY  2011  . COLONOSCOPY WITH PROPOFOL N/A 05/20/2018   Procedure: COLONOSCOPY WITH PROPOFOL;  Surgeon: Lin Landsman, MD;  Location: [facility] ENDOSCOPY;  Service: Gastroenterology;  Laterality: N/A;  . EYE SURGERY Left 10/05/2017  . ROOT CANAL    . suburethral sling  2012   Dr. Audie Box   OB History  Gravida Para Term Preterm AB Living  2 2 2     2   SAB TAB Ectopic Multiple Live Births               # Outcome Date GA Lbr Len/2nd Weight Sex Delivery Anes PTL Lv  2 Term           1 Term           Patient denies any other pertinent gynecologic issues.   No current facility-administered medications on file prior to encounter.    Current Outpatient Medications on File Prior to Encounter  Medication Sig Dispense Refill  . acetaminophen (TYLENOL) 500 MG tablet Take 1,000 mg by mouth every 6 (six) hours as needed  (for pain.).    Marland Kitchen acyclovir cream (ZOVIRAX) 5 % Apply 1 application topically every 4 (four) hours as needed (for cold sores).     Marland Kitchen aspirin EC 81 MG tablet Take 1 tablet (81 mg total) by mouth daily.    Marland Kitchen atorvastatin (LIPITOR) 40 MG tablet Take 1 tablet (40 mg total) by mouth at bedtime. (Patient taking differently: Take 40 mg by mouth every morning. ) 90 tablet 1  . B Complex Vitamins (VITAMIN-B COMPLEX) TABS Take 1 tablet by mouth daily.     Marland Kitchen BIOTIN PO Take 1 tablet by mouth daily.    . cholecalciferol (VITAMIN D3) 25 MCG (1000 UT) tablet Take 1,000 Units by mouth daily.    . hydrocortisone (ANUSOL-HC) 2.5 % rectal cream Place 1 application rectally as needed. Applied twice daily & as needed for bowel movements    . levothyroxine (SYNTHROID) 112 MCG tablet Take 112 mcg by mouth daily before breakfast.    . meloxicam (MOBIC) 15 MG tablet Take 15 mg by mouth daily as needed for pain.     . metroNIDAZOLE (METROCREAM) 0.75 % cream Apply topically daily. (Patient taking differently: Apply 1 application topically daily. face) 45 g 11  . Multiple Vitamins-Minerals (PRESERVISION AREDS PO) Take 1 tablet by  mouth daily.     . Probiotic Product (PROBIOTIC PO) Take 1 tablet by mouth daily.      Allergies  Allergen Reactions  . Penicillins Other (See Comments)    Ineffective for patient Did it involve swelling of the face/tongue/throat, SOB, or low BP? No Did it involve sudden or severe rash/hives, skin peeling, or any reaction on the inside of your mouth or nose? No Did you need to seek medical attention at a hospital or doctor's office? No When did it last happen?NEVER EXPERIENCED ANY REACTION FROM PENICILLIN If all above answers are "NO", may proceed with cephalosporin use.     Social History:   reports that she quit smoking about 27 years ago. Her smoking use included cigarettes. She has a 40.00 pack-year smoking history. She has never used smokeless tobacco. She reports current alcohol  use. She reports that she does not use drugs.  Family History  Problem Relation Age of Onset  . Diabetes Mother   . Heart disease Mother        CABG 2007  . Hyperlipidemia Mother   . COPD Mother   . Hyperlipidemia Father   . Heart disease Father   . Cancer Maternal Grandmother        breast  . Cancer Maternal Aunt        breast  . Parkinson's disease Brother   . Cancer Cousin        brain  . Cancer Cousin        melanoma    Review of Systems: Noncontributory  PHYSICAL EXAM: Blood pressure 128/80, pulse 66, temperature (!) 97.2 F (36.2 C), temperature source Tympanic, resp. rate 14, height 5' 2.5" (1.588 m), weight 49.9 kg, SpO2 100 %. General appearance - alert, well appearing, and in no distress Chest - clear to auscultation, no wheezes, rales or rhonchi, symmetric air entry Heart - normal rate and regular rhythm Abdomen - soft, nontender, nondistended, no masses or organomegaly Pelvic - examination not indicated Extremities - peripheral pulses normal, no pedal edema, no clubbing or cyanosis  Labs: Results for orders placed or performed during the hospital encounter of 12/15/18 (from the past 336 hour(s))  ABO/Rh   Collection Time: 12/15/18  7:15 AM  Result Value Ref Range   ABO/RH(D)      O POS Performed at [facility], [address] [place], [place] [address]   Results for orders placed or performed during the hospital encounter of 12/10/18 (from the past 336 hour(s))  Basic metabolic panel   Collection Time: 12/10/18 10:15 AM  Result Value Ref Range   Sodium 140 135 - 145 mmol/L   Potassium 4.2 3.5 - 5.1 mmol/L   Chloride 106 98 - 111 mmol/L   CO2 26 22 - 32 mmol/L   Glucose, Bld 93 70 - 99 mg/dL   BUN 13 8 - 23 mg/dL   Creatinine, Ser 0.46 0.44 - 1.00 mg/dL   Calcium 9.6 8.9 - 10.3 mg/dL   GFR calc non Af Amer >60 >60 mL/min   GFR calc Af Amer >60 >60 mL/min   Anion gap 8 5 - 15  Type and screen [place]    Collection Time: 12/10/18 10:15 AM  Result Value Ref Range   ABO/RH(D) O POS    Antibody Screen NEG    Sample Expiration 12/24/2018,2359    Extend sample reason      NO TRANSFUSIONS OR PREGNANCY IN THE PAST 3 MONTHS Performed at [facility], [place], [place] [address]   SARS CORONAVIRUS 2 Nasal Swab Aptima Multi Swab   Collection Time: 12/10/18 12:41 PM   Specimen: Aptima Multi Swab; Nasal Swab  Result Value Ref Range   SARS Coronavirus 2 NEGATIVE NEGATIVE    Assessment: Patient Active Problem List   Diagnosis Date Noted  . Ovarian cyst 12/15/2018  . Ventral hernia 12/15/2018    Plan: Patient will undergo surgical management with laparoscopic vs open hernia repair by Dr. Windell Moment, and Laparoscopic Unilateral salpingo-oophorectomy by me.   The risks of surgery were discussed in detail with the patient including but not limited to: bleeding which may require transfusion or reoperation; infection which may require antibiotics; injury to surrounding organs which may involve bowel, bladder, ureters ; need for additional procedures including laparoscopy or laparotomy; thromboembolic phenomenon, surgical site problems and other postoperative/anesthesia complications. Likelihood of success in alleviating the patient's condition was discussed. Routine postoperative instructions will be reviewed with the patient and her family in detail after surgery.  The patient concurred with the proposed plan, giving informed written consent for the surgery.  Patient has been NPO since last night she will remain NPO for procedure.  Anesthesia and OR aware.  Preoperative prophylactic antibiotics and SCDs ordered on call to the OR.  To OR when ready.  ----- Larey Days, MD, [place] Attending Obstetrician and Gynecologist [facility], Department of [facility]

## 2018-12-15 NOTE — Discharge Instructions (Signed)
AMBULATORY SURGERY  DISCHARGE INSTRUCTIONS   1) The drugs that you were given will stay in your system until tomorrow so for the next 24 hours you should not:  A) Drive an automobile B) Make any legal decisions C) Drink any alcoholic beverage   2) You may resume regular meals tomorrow.  Today it is better to start with liquids and gradually work up to solid foods.  You may eat anything you prefer, but it is better to start with liquids, then soup and crackers, and gradually work up to solid foods.   3) Please notify your doctor immediately if you have any unusual bleeding, trouble breathing, redness and pain at the surgery site, drainage, fever, or pain not relieved by medication.    4) Additional Instructions:        Please contact your physician with any problems or Same Day Surgery at [phone], Monday through Friday 6 am to 4 pm, or [place] [facility] [place] Main number at [phone]. Diet: Resume home heart healthy regular diet.   Activity: No heavy lifting >20 pounds (children, pets, laundry, garbage) or strenuous activity until follow-up, but light activity and walking are encouraged. Do not drive or drink alcohol if taking narcotic pain medications.  Wound care: May shower with soapy water and pat dry (do not rub incisions), but no baths or submerging incision underwater until follow-up. (no swimming)   Medications: Resume all home medications. For mild to moderate pain: acetaminophen (Tylenol) or ibuprofen (if no kidney disease). Combining Tylenol with alcohol can substantially increase your risk of causing liver disease. Narcotic pain medications, if prescribed, can be used for severe pain, though may cause nausea, constipation, and drowsiness. Do not combine Tylenol and Norco within a 6 hour period as Norco contains Tylenol. If you do not need the narcotic pain medication, you do not need to fill the prescription.  Call office ([phone]) at any time if any  questions, worsening pain, fevers/chills, bleeding, drainage from incision site, or other concerns.  

## 2018-12-15 NOTE — OR Nursing (Signed)
Spoke with Dr Leonides Schanz no new discharge instructions given. States she or her office will touch base with Samantha Booker.

## 2018-12-15 NOTE — Transfer of Care (Signed)
Immediate Anesthesia Transfer of Care Note  Patient: Samantha Booker  Procedure(s) Performed: LAPAROSCOPIC LEFT INGUINAL HERNIA VS OPEN (Left ) LAPAROSCOPIC UNILATERAL OOPHORECTOMY (Right )  Patient Location: PACU  Anesthesia Type:General  Level of Consciousness: drowsy and patient cooperative  Airway & Oxygen Therapy: Patient Spontanous Breathing and Patient connected to face mask oxygen  Post-op Assessment: Report given to RN and Post -op Vital signs reviewed and stable  Post vital signs: Reviewed and stable  Last Vitals:  Vitals Value Taken Time  BP 131/76 12/15/18 1124  Temp    Pulse 84 12/15/18 1125  Resp 15 12/15/18 1125  SpO2 100 % 12/15/18 1125  Vitals shown include unvalidated device data.  Last Pain:  Vitals:   12/15/18 0706  TempSrc: Tympanic  PainSc: 0-No pain         Complications: No apparent anesthesia complications

## 2018-12-15 NOTE — Anesthesia Preprocedure Evaluation (Signed)
Anesthesia Evaluation  Patient identified by MRN, date of birth, ID band Patient awake    Reviewed: Allergy & Precautions, H&P , NPO status , Patient's Chart, lab work & pertinent test results  History of Anesthesia Complications Negative for: history of anesthetic complications  Airway Mallampati: II  TM Distance: >3 FB Neck ROM: limited    Dental  (+) Chipped   Pulmonary neg shortness of breath, former smoker,           Cardiovascular Exercise Tolerance: Good (-) angina(-) Past MI and (-) DOE negative cardio ROS       Neuro/Psych negative neurological ROS  negative psych ROS   GI/Hepatic negative GI ROS, Neg liver ROS, neg GERD  ,  Endo/Other  Hypothyroidism   Renal/GU      Musculoskeletal  (+) Arthritis ,   Abdominal   Peds  Hematology negative hematology ROS (+)   Anesthesia Other Findings Past Medical History: 06/25/2016: Atherosclerosis of aorta [facility])     Comment:  Imaging Feb 2018 No date: Chicken pox No date: DDD (degenerative disc disease), cervical No date: Foliclar lymph grade III, unsp, nodes of head, face, 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  ([facility]) No date: History of hepatitis B No date: History of kidney stones No date: Hyperlipidemia No date: Hypothyroidism No date: Jaundice No date: Thyroid disease  Past Surgical History: 1980: ABDOMINAL HYSTERECTOMY     Comment:  precancerous cells 1979: APPENDECTOMY 2019: CATARACT EXTRACTION; Bilateral 2011: CHOLECYSTECTOMY 05/20/2018: COLONOSCOPY WITH PROPOFOL; N/A     Comment:  Procedure: COLONOSCOPY WITH PROPOFOL;  Surgeon: Lin Landsman, MD;  Location: [facility] ENDOSCOPY;  Service:               Gastroenterology;  Laterality: N/A; 10/05/2017: EYE SURGERY; Left No date: ROOT CANAL 2012: suburethral sling     Comment:  Dr. Audie Box  BMI    Body Mass Index: 19.80 kg/m      Reproductive/Obstetrics negative OB ROS                              Anesthesia Physical Anesthesia Plan  ASA: III  Anesthesia Plan: General ETT   Post-op Pain Management:    Induction: Intravenous  PONV Risk Score and Plan: Ondansetron, Dexamethasone, Midazolam and Treatment may vary due to age or medical condition  Airway Management Planned: Oral ETT  Additional Equipment:   Intra-op Plan:   Post-operative Plan: Extubation in OR  Informed Consent: I have reviewed the patients History and Physical, chart, labs and discussed the procedure including the risks, benefits and alternatives for the proposed anesthesia with the patient or authorized representative who has indicated his/her understanding and acceptance.     Dental Advisory Given  Plan Discussed with: Anesthesiologist, CRNA and Surgeon  Anesthesia Plan Comments: (Patient consented for risks of anesthesia including but not limited to:  - adverse reactions to medications - damage to teeth, lips or other oral mucosa - sore throat or hoarseness - Damage to heart, brain, lungs or loss of life  Patient voiced understanding.)        Anesthesia Quick Evaluation

## 2018-12-15 NOTE — Progress Notes (Signed)
Nauseated on and off  Phenergan ordered but remains very sleepy   Awakens easily when spoken to

## 2018-12-15 NOTE — Interval H&P Note (Signed)
History and Physical Interval Note:  12/15/2018 7:30 AM  Samantha Booker  has presented today for surgery, with the diagnosis of K40.90 NON RECURRENT UNILATRL INQUINAL HERNIA W/O OBSTRUCTION OR GANGRENE.  The various methods of treatment have been discussed with the patient and family. After consideration of risks, benefits and other options for treatment, the patient has consented to  Procedure(s): LAPAROSCOPIC LEFT INGUINAL HERNIA VS OPEN (Left) as a surgical intervention.  The patient's history has been reviewed, patient examined, no change in status, stable for surgery.  I have reviewed the patient's chart and labs.  Left inguinal area marked in the pre procedure room. Questions were answered to the patient's satisfaction.     Herbert Pun

## 2018-12-16 LAB — SURGICAL PATHOLOGY

## 2018-12-16 NOTE — Anesthesia Postprocedure Evaluation (Signed)
Anesthesia Post Note  Patient: FIDELA CIESLAK  Procedure(s) Performed: LAPAROSCOPIC LEFT INGUINAL HERNIA VS OPEN (Left ) LAPAROSCOPIC UNILATERAL OOPHORECTOMY (Right )  Patient location during evaluation: PACU Anesthesia Type: General Level of consciousness: awake and alert Pain management: pain level controlled Vital Signs Assessment: post-procedure vital signs reviewed and stable Respiratory status: spontaneous breathing, nonlabored ventilation, respiratory function stable and patient connected to nasal cannula oxygen Cardiovascular status: blood pressure returned to baseline and stable Postop Assessment: no apparent nausea or vomiting Anesthetic complications: no     Last Vitals:  Vitals:   12/15/18 1442 12/15/18 1457  BP: 120/68 132/78  Pulse: 78 83  Resp: 16 20  Temp: 36.6 C 37 C  SpO2: 96% 95%    Last Pain:  Vitals:   12/16/18 0829  TempSrc:   PainSc: 2                  Precious Haws Tylar Merendino

## 2018-12-27 ENCOUNTER — Encounter

## 2019-01-12 DIAGNOSIS — E039 Hypothyroidism, unspecified: Secondary | ICD-10-CM | POA: Diagnosis not present

## 2019-01-12 DIAGNOSIS — Z0000 Encounter for general adult medical examination without abnormal findings: Secondary | ICD-10-CM | POA: Diagnosis not present

## 2019-01-12 DIAGNOSIS — R7309 Other abnormal glucose: Secondary | ICD-10-CM | POA: Diagnosis not present

## 2019-01-12 DIAGNOSIS — E782 Mixed hyperlipidemia: Secondary | ICD-10-CM | POA: Diagnosis not present

## 2019-01-12 DIAGNOSIS — Z79899 Other long term (current) drug therapy: Secondary | ICD-10-CM | POA: Diagnosis not present

## 2019-01-27 DIAGNOSIS — K644 Residual hemorrhoidal skin tags: Secondary | ICD-10-CM | POA: Diagnosis not present

## 2019-01-27 DIAGNOSIS — K648 Other hemorrhoids: Secondary | ICD-10-CM | POA: Diagnosis not present

## 2019-04-06 DIAGNOSIS — E782 Mixed hyperlipidemia: Secondary | ICD-10-CM | POA: Diagnosis not present

## 2019-04-06 DIAGNOSIS — E039 Hypothyroidism, unspecified: Secondary | ICD-10-CM | POA: Diagnosis not present

## 2019-04-06 DIAGNOSIS — Z79899 Other long term (current) drug therapy: Secondary | ICD-10-CM | POA: Diagnosis not present

## 2019-04-06 DIAGNOSIS — R7309 Other abnormal glucose: Secondary | ICD-10-CM | POA: Diagnosis not present

## 2019-04-13 DIAGNOSIS — E039 Hypothyroidism, unspecified: Secondary | ICD-10-CM | POA: Diagnosis not present

## 2019-04-13 DIAGNOSIS — R739 Hyperglycemia, unspecified: Secondary | ICD-10-CM | POA: Diagnosis not present

## 2019-04-13 DIAGNOSIS — R110 Nausea: Secondary | ICD-10-CM | POA: Diagnosis not present

## 2019-04-13 DIAGNOSIS — E782 Mixed hyperlipidemia: Secondary | ICD-10-CM | POA: Diagnosis not present

## 2019-05-09 DIAGNOSIS — M75101 Unspecified rotator cuff tear or rupture of right shoulder, not specified as traumatic: Secondary | ICD-10-CM | POA: Diagnosis not present

## 2019-06-01 IMAGING — MR MRI CERVICAL SPINE WITHOUT CONTRAST
5 series · 40 of 48 positions shown · non-contrast
Comparison: None.

CLINICAL DATA: Cervical radiculopathy.  Neck pain

EXAM:
MRI CERVICAL SPINE WITHOUT CONTRAST
TECHNIQUE: Multiplanar, multisequence MR imaging of the cervical spine was
performed. No intravenous contrast was administered.

[Series 5: T2 · sagittal · 3.0mm · 0.62mm/px · 7 of 15 slices shown (1 of 2)]
[im 1/15]
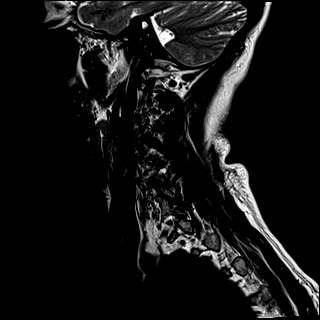
[im 3/15]
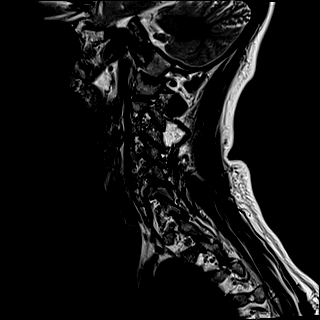
[im 5/15]
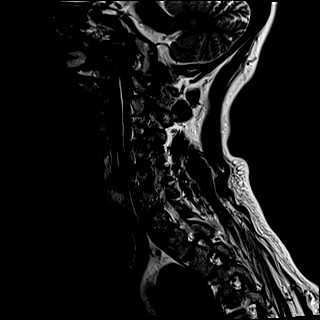
[im 8/15]
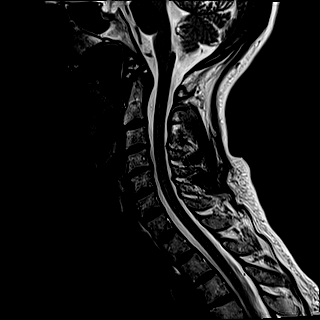
[im 10/15]
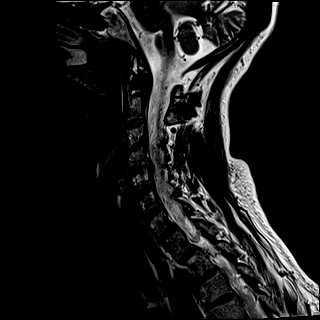
[im 12/15]
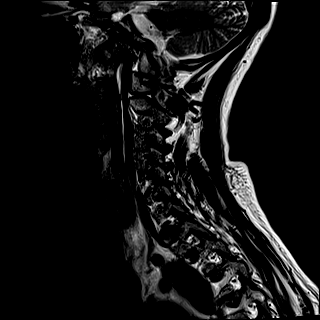
[im 15/15]
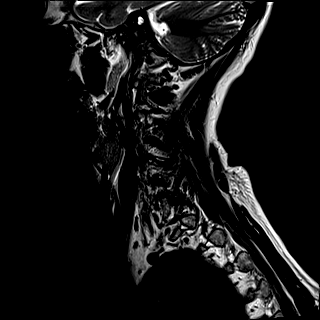

[Series 6: FLAIR · sagittal · 3.0mm · 0.78mm/px · 7 of 15 slices shown]
[im 1/15]
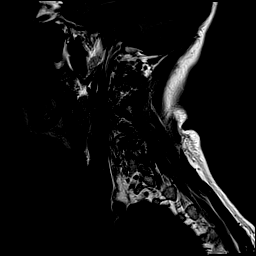
[im 3/15]
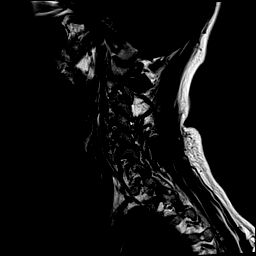
[im 5/15]
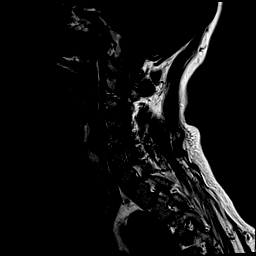
[im 8/15]
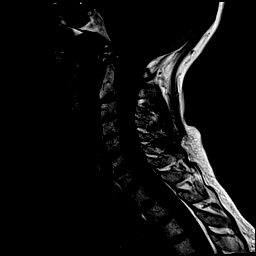
[im 10/15]
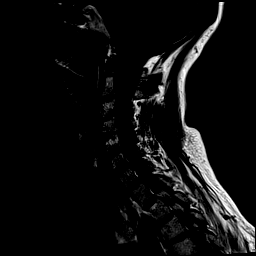
[im 12/15]
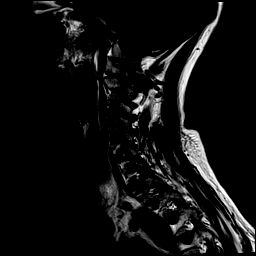
[im 15/15]
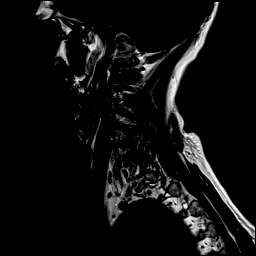

[Series 7: STIR · sagittal · 3.0mm · 0.62mm/px · 8 of 15 slices shown]
[im 1/15]
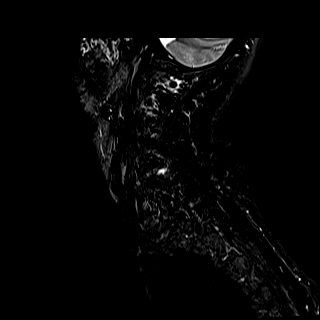
[im 3/15]
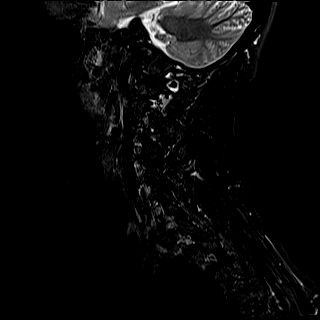
[im 5/15]
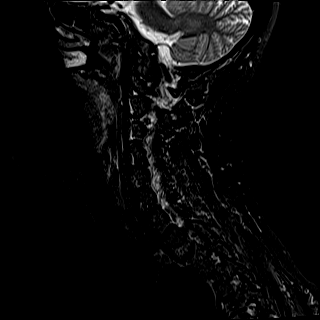
[im 7/15]
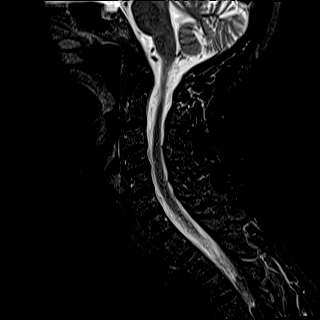
[im 9/15]
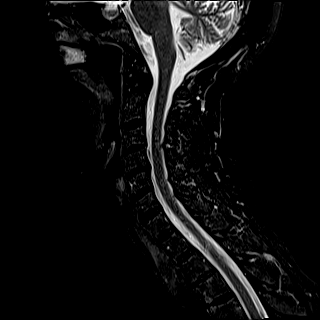
[im 11/15]
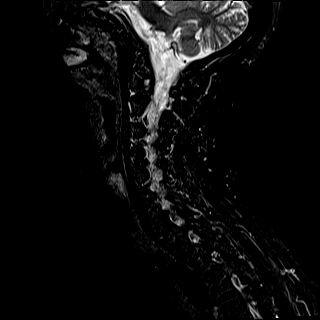
[im 13/15]
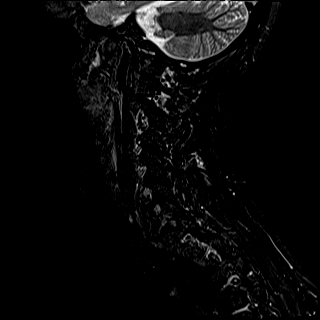
[im 15/15]
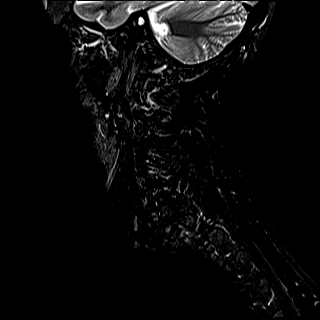

[Series 8: T2 · axial · 3.0mm · 0.70mm/px · z∈[-52,+28]mm · 10 of 25 slices shown (2 of 2)]
[im 1/25]
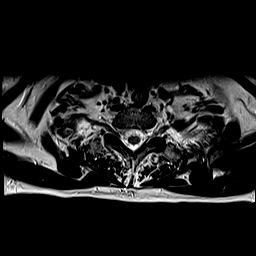
[im 3/25]
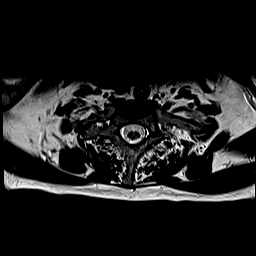
[im 5/25]
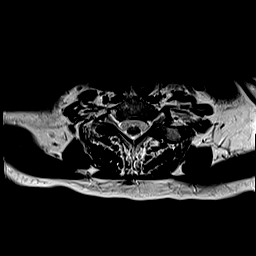
[im 9/25]
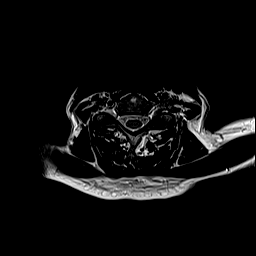
[im 11/25]
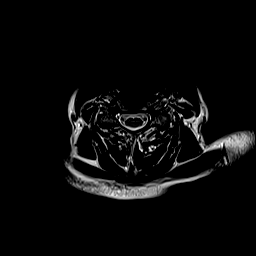
[im 13/25]
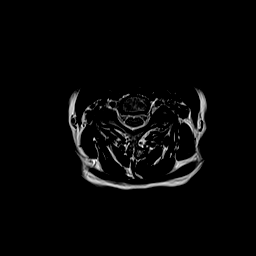
[im 15/25]
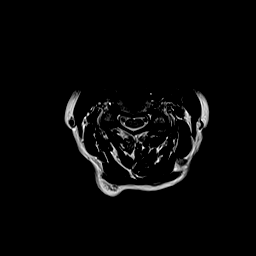
[im 17/25]
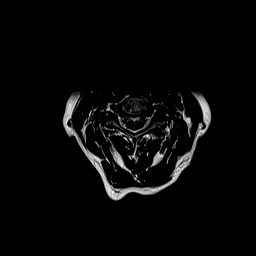
[im 21/25]
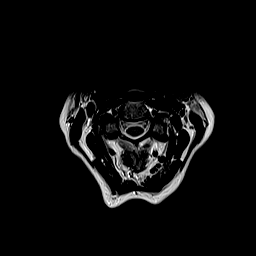
[im 25/25]
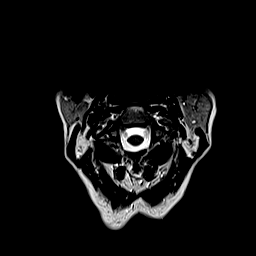

[Series 9: ax mpgr · axial · 3.0mm · 0.35mm/px · z∈[-52,+28]mm · 8 of 25 slices shown]
[im 1/25]
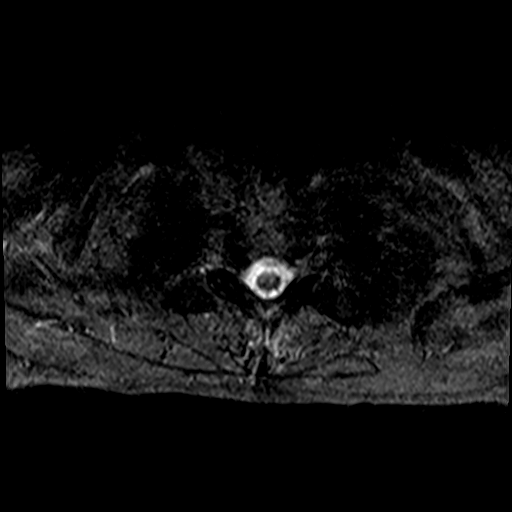
[im 5/25]
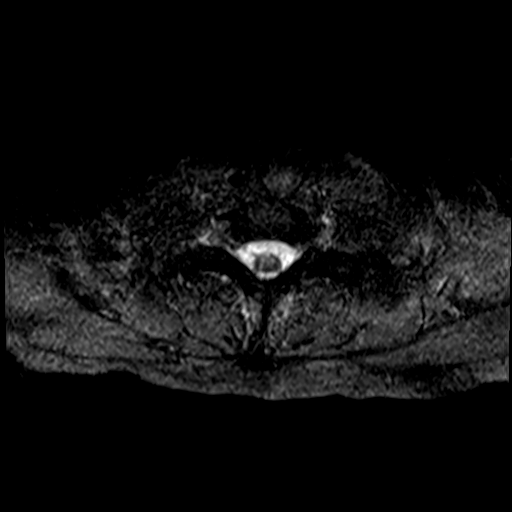
[im 9/25]
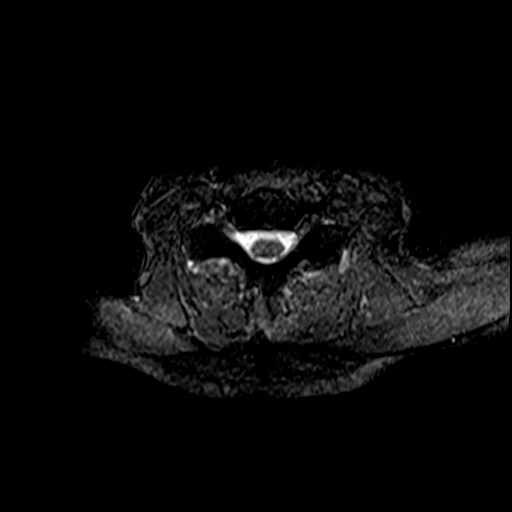
[im 11/25]
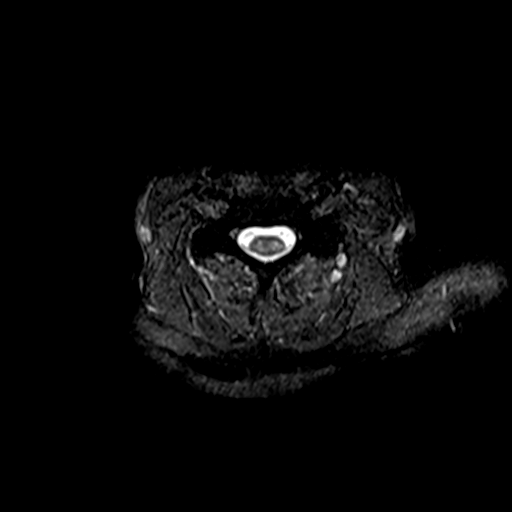
[im 15/25]
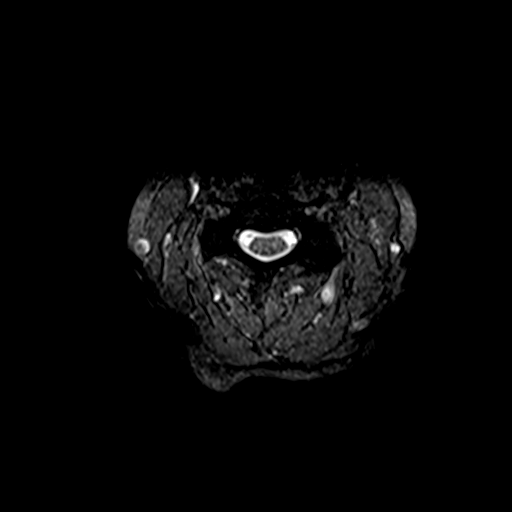
[im 17/25]
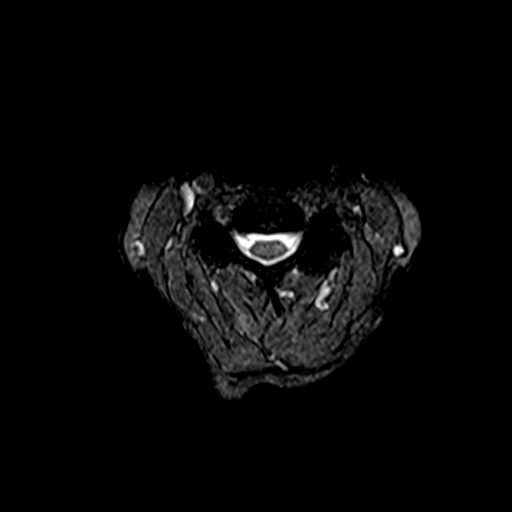
[im 21/25]
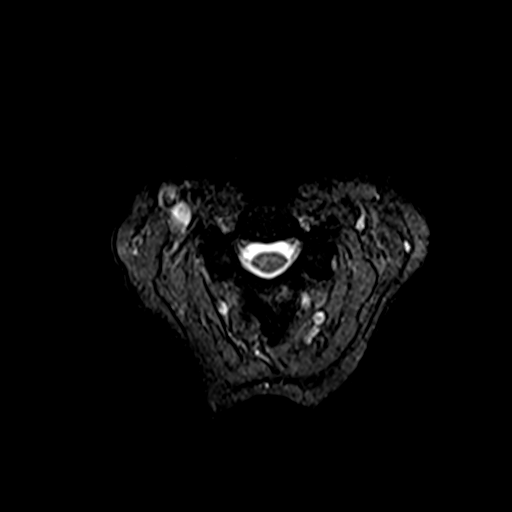
[im 25/25]
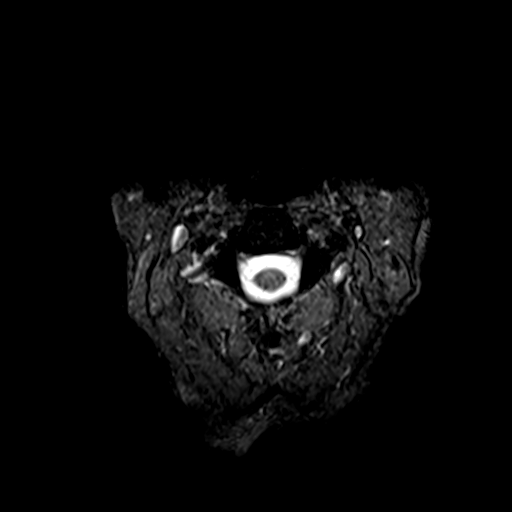

[40 of 48 positions shown; findings below may reference images not displayed]

FINDINGS: Alignment: Normal

Vertebrae: Normal bone marrow.  Negative for fracture or mass.

Cord: Normal cord signal and morphology

Posterior Fossa, vertebral arteries, paraspinal tissues: Negative

Disc levels:

C2-3: Negative

C3-4: Mild disc and mild facet degeneration. Mild foraminal
narrowing bilaterally due to spurring

C4-5: Disc and facet degeneration. Mild foraminal narrowing
bilaterally due to spurring

C5-6: Mild disc and mild facet degeneration.  Negative for stenosis

C6-7: Mild facet degeneration bilaterally.  Negative for stenosis

C7-T1: Negative
IMPRESSION: Multilevel facet degeneration in the lumbar spine. Negative for disc
protrusion.

## 2019-06-18 ENCOUNTER — Ambulatory Visit: Payer: PPO | Attending: Internal Medicine

## 2019-06-18 DIAGNOSIS — Z23 Encounter for immunization: Secondary | ICD-10-CM | POA: Insufficient documentation

## 2019-06-18 NOTE — Progress Notes (Signed)
   Covid-19 Vaccination Clinic  Name:  Samantha Booker    MRN: [id number] DOB: Dec 20, 1950  06/18/2019  Samantha Booker was observed post Covid-19 immunization for 15 minutes without incidence. She was provided with Vaccine Information Sheet and instruction to access the V-Safe system.   Samantha Booker was instructed to call 911 with any severe reactions post vaccine: Marland Kitchen Difficulty breathing  . Swelling of your face and throat  . A fast heartbeat  . A bad rash all over your body  . Dizziness and weakness    Immunizations Administered    Name Date Dose VIS Date Route   Pfizer COVID-19 Vaccine 06/18/2019  1:14 PM 0.3 mL 04/08/2019 Intramuscular   Manufacturer: [place]   Lot: [id number]   [place]: [id number]

## 2019-06-27 ENCOUNTER — Other Ambulatory Visit: Payer: Self-pay

## 2019-06-28 ENCOUNTER — Encounter: Payer: Self-pay | Admitting: Gastroenterology

## 2019-06-28 ENCOUNTER — Ambulatory Visit: Payer: PPO | Admitting: Gastroenterology

## 2019-06-28 ENCOUNTER — Other Ambulatory Visit: Payer: Self-pay

## 2019-06-28 VITALS — BP 155/90 | HR 76 | Temp 98.2°F | Ht 62.5 in | Wt 112.2 lb

## 2019-06-28 DIAGNOSIS — R1013 Epigastric pain: Secondary | ICD-10-CM | POA: Diagnosis not present

## 2019-06-28 DIAGNOSIS — R634 Abnormal weight loss: Secondary | ICD-10-CM

## 2019-06-28 DIAGNOSIS — G8929 Other chronic pain: Secondary | ICD-10-CM

## 2019-06-28 DIAGNOSIS — R630 Anorexia: Secondary | ICD-10-CM

## 2019-06-28 DIAGNOSIS — R110 Nausea: Secondary | ICD-10-CM | POA: Diagnosis not present

## 2019-06-28 NOTE — Progress Notes (Signed)
Cephas Darby, MD [address]  [place]  [place], [place] [address]  Main: [phone]  Fax: [phone]    Gastroenterology Consultation  Referring Provider:     Idelle Crouch, MD Primary Care Physician:  Idelle Crouch, MD Primary Gastroenterologist:  Dr. Cephas Darby Reason for Consultation:    Grade 3 symptomatic external hemorrhoids        HPI:   Samantha Booker is a 69 y.o. Caucasian female referred by Dr. Doy Hutching, Leonie Douglas, MD  for consultation & management of possible rectal mass and colonoscopy.  Patient reports that she has been experiencing severe rectal discomfort associated with itching, burning, irritation and intermittent rectal bleeding since 01/2018.  She saw Dr. Sanda Klein who was concerned about possible rectal mass.  Patient reports that she was having hemorrhoidal symptoms intermittently but has noticed protrusion of soft tissue outside the anal canal which has to be manually reduced in the last 3 months.  She denies constipation, diarrhea, abdominal pain.  She lost some weight intentionally and she is following low-fat low-carb diet.  She denies any other GI symptoms today.  Patient has tried Preparation H with no relief.  She has history of hypothyroidism, currently on Synthroid.  Most recent TSH on 04/29/2018 came back elevated.  Hemoglobin is normal, LFTs are normal.  Follow-up visit 06/08/2018 Patient underwent colonoscopy which revealed large external hemorrhoids, 2 subcentimeter tubular adenomas without dysplasia which were removed.  Patient is here to discuss about hemorrhoid ligation.  She reports that her hemorrhoid symptoms have significantly improved, she continues to have prolapse of the hemorrhoids that she has to manually reduce sometimes as well as itching for which she is doing witch hazel and Tucks pads.  Follow-up visit 06/28/19 Patient underwent repair of the left inguinal hernia as well as right oophorectomy in 11/2018.  Patient reports  that since surgery, she has lost weight, reports feeling nauseous, lack of appetite.  She denies any lower abdominal pain, constipation or rectal bleeding.  She is referred here by Dr. Doy Hutching due to nausea and lack of appetite.  She also reports that she has been experiencing severe neck pain needing steroid injections that she thinks has resulted in nausea from pain.  Her weight has been stable around 110 pounds, she is unable to gain weight.  Her labs in 03/2019 are unremarkable for CBC, CMP, HbA1c 6.1, normal TSH and free T4  She has history of hepatitis B with unclear status  NSAIDs: None  Antiplts/Anticoagulants/Anti thrombotics: None  GI Procedures: Colonoscopy in 2005, reportedly normal Colonoscopy 04/2018 - Hemorrhoids found on perianal exam. - One 6 mm polyp in the ascending colon, removed with a cold snare. Resected and retrieved. - One 8 mm polyp in the descending colon, removed with a hot snare. Resected and retrieved. - Non-bleeding external hemorrhoids. DIAGNOSIS:  A. COLON POLYP X 1, ASCENDING; COLD SNARE:  - SESSILE SERRATED POLYP (ADENOMA), 2 FRAGMENTS.  - NEGATIVE FOR DYSPLASIA AND MALIGNANCY.   B. COLON POLYP X 1, DESCENDING; HOT SNARE:  - CAUTERIZED POLYP, FAVOR SERRATED LESION.  - NO DYSPLASIA OR MALIGNANCY IS IDENTIFIED BUT ARTIFACT LIMITS  INTERPRETATION.    Past Medical History:  Diagnosis Date  . Atherosclerosis of aorta ([place]) 06/25/2016   Imaging Feb 2018  . Chicken pox   . DDD (degenerative disc disease), cervical   . 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 History of hepatitis B   .  History of kidney stones   . Hyperlipidemia   . Hypothyroidism   . Jaundice   . Thyroid disease     Past Surgical History:  Procedure Laterality Date  . ABDOMINAL HYSTERECTOMY  1980   precancerous cells  . APPENDECTOMY  1979  . CATARACT EXTRACTION Bilateral 2019  . CHOLECYSTECTOMY  2011  . COLONOSCOPY WITH PROPOFOL N/A 05/20/2018    Procedure: COLONOSCOPY WITH PROPOFOL;  Surgeon: Lin Landsman, MD;  Location: [facility] ENDOSCOPY;  Service: Gastroenterology;  Laterality: N/A;  . EYE SURGERY Left 10/05/2017  . INGUINAL HERNIA REPAIR Left 12/15/2018   Procedure: LAPAROSCOPIC LEFT INGUINAL HERNIA VS OPEN;  Surgeon: Herbert Pun, MD;  Location: [facility] ORS;  Service: General;  Laterality: Left;  . LAPAROSCOPIC UNILATERAL SALPINGO OOPHERECTOMY Right 12/15/2018   Procedure: LAPAROSCOPIC UNILATERAL OOPHORECTOMY;  Surgeon: Ward, Honor Loh, MD;  Location: [facility] ORS;  Service: Gynecology;  Laterality: Right;  . ROOT CANAL    . suburethral sling  2012   Dr. Audie Box   Current Outpatient Medications:  .  acetaminophen (TYLENOL) 500 MG tablet, Take 1,000 mg by mouth every 6 (six) hours as needed (for pain.)., Disp: , Rfl:  .  acyclovir cream (ZOVIRAX) 5 %, Apply 1 application topically every 4 (four) hours as needed (for cold sores). , Disp: , Rfl:  .  aspirin EC 81 MG tablet, Take 1 tablet (81 mg total) by mouth daily., Disp: , Rfl:  .  atorvastatin (LIPITOR) 40 MG tablet, Take 1 tablet (40 mg total) by mouth at bedtime. (Patient taking differently: Take 40 mg by mouth every morning. ), Disp: 90 tablet, Rfl: 1 .  B Complex Vitamins (VITAMIN-B COMPLEX) TABS, Take 1 tablet by mouth daily. , Disp: , Rfl:  .  BIOTIN PO, Take 1 tablet by mouth daily., Disp: , Rfl:  .  cholecalciferol (VITAMIN D3) 25 MCG (1000 UT) tablet, Take 1,000 Units by mouth daily., Disp: , Rfl:  .  clobetasol (TEMOVATE) 0.05 % external solution, , Disp: , Rfl:  .  levothyroxine (SYNTHROID) 112 MCG tablet, Take 112 mcg by mouth daily before breakfast., Disp: , Rfl:  .  LORazepam (ATIVAN) 0.5 MG tablet, Take 0.5 mg by mouth 2 (two) times daily as needed., Disp: , Rfl:  .  meloxicam (MOBIC) 15 MG tablet, Take 15 mg by mouth daily as needed for pain. , Disp: , Rfl:  .  metroNIDAZOLE (METROCREAM) 0.75 % cream, Apply topically daily. (Patient taking differently: Apply 1  application topically daily. face), Disp: 45 g, Rfl: 11 .  Multiple Vitamins-Minerals (PRESERVISION AREDS PO), Take 1 tablet by mouth daily. , Disp: , Rfl:  .  PREMARIN 1.25 MG tablet, Take 1.25 mg by mouth daily., Disp: , Rfl:  .  Probiotic Product (PROBIOTIC PO), Take 1 tablet by mouth daily. , Disp: , Rfl:  .  promethazine (PHENERGAN) 25 MG tablet, Take 25 mg by mouth every 6 (six) hours as needed., Disp: , Rfl:    Family History  Problem Relation Age of Onset  . Diabetes Mother   . Heart disease Mother        CABG 2007  . Hyperlipidemia Mother   . COPD Mother   . Hyperlipidemia Father   . Heart disease Father   . Cancer Maternal Grandmother        breast  . Cancer Maternal Aunt        breast  . Parkinson's disease Brother   . Cancer Cousin        brain  .  Cancer Cousin        melanoma     Social History   Tobacco Use  . Smoking status: Former Smoker    Packs/day: 2.00    Years: 20.00    Pack years: 40.00    Types: Cigarettes    Quit date: 12/09/1991    Years since quitting: 27.5  . Smokeless tobacco: Never Used  Substance Use Topics  . Alcohol use: Yes    Alcohol/week: 0.0 standard drinks    Comment: socially  . Drug use: No    Allergies as of 06/28/2019 - Review Complete 06/28/2019  Allergen Reaction Noted  . Penicillins Other (See Comments) 03/07/2010    Review of Systems:    All systems reviewed and negative except where noted in HPI.   Physical Exam:  BP (!) 155/90 (BP Location: Left Arm, Patient Position: Sitting, Cuff Size: Normal)   Pulse 76   Temp 98.2 F (36.8 C) (Oral)   Ht 5' 2.5" (1.588 m)   Wt 112 lb 4 oz (50.9 kg)   BMI 20.20 kg/m  No LMP recorded. Patient has had a hysterectomy.  General:   Alert,  Well-developed, well-nourished, pleasant and cooperative in NAD Head:  Normocephalic and atraumatic. Eyes:  Sclera clear, no icterus.   Conjunctiva pink. Ears:  Normal auditory acuity. Nose:  No deformity, discharge, or lesions. Mouth:   No deformity or lesions,oropharynx pink & moist. Neck:  Supple; no masses or thyromegaly. Lungs:  Respirations even and unlabored.  Clear throughout to auscultation.   No wheezes, crackles, or rhonchi. No acute distress. Heart:  Regular rate and rhythm; no murmurs, clicks, rubs, or gallops. Abdomen:  Normal bowel sounds. Soft, epigastric tenderness and non-distended without masses, hepatosplenomegaly or hernias noted.  No guarding or rebound tenderness.   Rectal: Skin tags and prolapsed external hemorrhoids Msk:  Symmetrical without gross deformities. Good, equal movement & strength bilaterally. Pulses:  Normal pulses noted. Extremities:  No clubbing or edema.  No cyanosis. Neurologic:  Alert and oriented x3;  grossly normal neurologically. Skin:  Intact without significant lesions or rashes. No jaundice. Psych:  Alert and cooperative. Normal mood and affect.  Imaging Studies: None  Assessment and Plan:   JIANI HALD is a 69 y.o. Caucasian female with history of symptomatic, grade 3 external hemorrhoids s/p hemorrhoid ligation, s/p elective inguinal hernia repair seen in consultation for nausea and lack of appetite  Nausea, epigastric tenderness, lack of appetite Recommend EGD with biopsies for further evaluation Advised to try over-the-counter omeprazole 20 mg 2 times a day  Tubular adenomas of colon Recommend surveillance colonoscopy in 04/2023   Follow up in 4 weeks   Cephas Darby, MD

## 2019-06-30 DIAGNOSIS — Z1231 Encounter for screening mammogram for malignant neoplasm of breast: Secondary | ICD-10-CM | POA: Diagnosis not present

## 2019-07-13 ENCOUNTER — Ambulatory Visit: Payer: PPO | Attending: Internal Medicine

## 2019-07-13 DIAGNOSIS — Z23 Encounter for immunization: Secondary | ICD-10-CM

## 2019-07-13 NOTE — Progress Notes (Signed)
   Covid-19 Vaccination Clinic  Name:  Samantha Booker    MRN: [id number] DOB: [birth year]  07/13/2019  [name] was observed post Covid-19 immunization for 15 minutes without incident. She was provided with Vaccine Information Sheet and instruction to access the V-Safe system.   [name] was instructed to call 911 with any severe reactions post vaccine: Marland Kitchen Difficulty breathing  . Swelling of face and throat  . A fast heartbeat  . A bad rash all over body  . Dizziness and weakness   Immunizations Administered    Name Date Dose VIS Date Route   Pfizer COVID-19 Vaccine 07/13/2019 10:41 AM 0.3 mL 04/08/2019 Intramuscular   Manufacturer: [place]   Lot: [id number]   [place]: [id number]

## 2019-07-14 ENCOUNTER — Other Ambulatory Visit: Payer: Self-pay

## 2019-07-14 ENCOUNTER — Encounter: Payer: Self-pay | Admitting: Gastroenterology

## 2019-07-19 ENCOUNTER — Other Ambulatory Visit
Admission: RE | Admit: 2019-07-19 | Discharge: 2019-07-19 | Disposition: A | Discharge status: Home / Self Care | Payer: PPO | Source: Ambulatory Visit | Attending: Gastroenterology | Admitting: Gastroenterology

## 2019-07-19 ENCOUNTER — Other Ambulatory Visit: Payer: Self-pay

## 2019-07-19 DIAGNOSIS — Z79899 Other long term (current) drug therapy: Secondary | ICD-10-CM | POA: Diagnosis not present

## 2019-07-19 DIAGNOSIS — R1013 Epigastric pain: Secondary | ICD-10-CM | POA: Diagnosis not present

## 2019-07-19 DIAGNOSIS — E039 Hypothyroidism, unspecified: Secondary | ICD-10-CM | POA: Diagnosis not present

## 2019-07-19 DIAGNOSIS — Z20822 Contact with and (suspected) exposure to covid-19: Secondary | ICD-10-CM | POA: Diagnosis not present

## 2019-07-19 DIAGNOSIS — Z7982 Long term (current) use of aspirin: Secondary | ICD-10-CM | POA: Diagnosis not present

## 2019-07-19 DIAGNOSIS — Z87891 Personal history of nicotine dependence: Secondary | ICD-10-CM | POA: Diagnosis not present

## 2019-07-19 DIAGNOSIS — I700 Atherosclerosis of aorta: Secondary | ICD-10-CM | POA: Diagnosis not present

## 2019-07-19 DIAGNOSIS — K319 Disease of stomach and duodenum, unspecified: Secondary | ICD-10-CM | POA: Diagnosis not present

## 2019-07-19 DIAGNOSIS — E785 Hyperlipidemia, unspecified: Secondary | ICD-10-CM | POA: Diagnosis not present

## 2019-07-19 LAB — SARS CORONAVIRUS 2 (TAT 6-24 HRS): SARS Coronavirus 2: NEGATIVE

## 2019-07-20 NOTE — Discharge Instructions (Signed)
General Anesthesia, Adult, Care After This sheet gives you information about how to care for yourself after your procedure. Your health care provider may also give you more specific instructions. If you have problems or questions, contact your health care provider. What can I expect after the procedure? After the procedure, the following side effects are common:  Pain or discomfort at the IV site.  Nausea.  Vomiting.  Sore throat.  Trouble concentrating.  Feeling cold or chills.  Weak or tired.  Sleepiness and fatigue.  Soreness and body aches. These side effects can affect parts of the body that were not involved in surgery. Follow these instructions at home:  For at least 24 hours after the procedure:  Have a responsible adult stay with you. It is important to have someone help care for you until you are awake and alert.  Rest as needed.  Do not: ? Participate in activities in which you could fall or become injured. ? Drive. ? Use heavy machinery. ? Drink alcohol. ? Take sleeping pills or medicines that cause drowsiness. ? Make important decisions or sign legal documents. ? Take care of children on your own. Eating and drinking  Follow any instructions from your health care provider about eating or drinking restrictions.  When you feel hungry, start by eating small amounts of foods that are soft and easy to digest (bland), such as toast. Gradually return to your regular diet.  Drink enough fluid to keep your urine pale yellow.  If you vomit, rehydrate by drinking water, juice, or clear broth. General instructions  If you have sleep apnea, surgery and certain medicines can increase your risk for breathing problems. Follow instructions from your health care provider about wearing your sleep device: ? Anytime you are sleeping, including during daytime naps. ? While taking prescription pain medicines, sleeping medicines, or medicines that make you drowsy.  Return to  your normal activities as told by your health care provider. Ask your health care provider what activities are safe for you.  Take over-the-counter and prescription medicines only as told by your health care provider.  If you smoke, do not smoke without supervision.  Keep all follow-up visits as told by your health care provider. This is important. Contact a health care provider if:  You have nausea or vomiting that does not get better with medicine.  You cannot eat or drink without vomiting.  You have pain that does not get better with medicine.  You are unable to pass urine.  You develop a skin rash.  You have a fever.  You have redness around your IV site that gets worse. Get help right away if:  You have difficulty breathing.  You have chest pain.  You have blood in your urine or stool, or you vomit blood. Summary  After the procedure, it is common to have a sore throat or nausea. It is also common to feel tired.  Have a responsible adult stay with you for the first 24 hours after general anesthesia. It is important to have someone help care for you until you are awake and alert.  When you feel hungry, start by eating small amounts of foods that are soft and easy to digest (bland), such as toast. Gradually return to your regular diet.  Drink enough fluid to keep your urine pale yellow.  Return to your normal activities as told by your health care provider. Ask your health care provider what activities are safe for you. This information is not   intended to replace advice given to you by your health care provider. Make sure you discuss any questions you have with your health care provider. Document Revised: 04/17/2017 Document Reviewed: 11/28/2016 Elsevier Patient Education  2020 Elsevier Inc.  

## 2019-07-21 ENCOUNTER — Ambulatory Visit: Payer: PPO | Admitting: Anesthesiology

## 2019-07-21 ENCOUNTER — Encounter
Admission: RE | Disposition: A | Discharge status: Home / Self Care | Payer: Self-pay | Source: Home / Self Care | Attending: Gastroenterology

## 2019-07-21 ENCOUNTER — Ambulatory Visit
Admission: RE | Admit: 2019-07-21 | Discharge: 2019-07-21 | Disposition: A | Discharge status: Home / Self Care | Payer: PPO | Attending: Gastroenterology | Admitting: Gastroenterology

## 2019-07-21 ENCOUNTER — Encounter: Payer: Self-pay | Admitting: Gastroenterology

## 2019-07-21 ENCOUNTER — Other Ambulatory Visit: Payer: Self-pay

## 2019-07-21 DIAGNOSIS — Z20822 Contact with and (suspected) exposure to covid-19: Secondary | ICD-10-CM | POA: Insufficient documentation

## 2019-07-21 DIAGNOSIS — E785 Hyperlipidemia, unspecified: Secondary | ICD-10-CM | POA: Insufficient documentation

## 2019-07-21 DIAGNOSIS — K319 Disease of stomach and duodenum, unspecified: Secondary | ICD-10-CM | POA: Insufficient documentation

## 2019-07-21 DIAGNOSIS — Z7982 Long term (current) use of aspirin: Secondary | ICD-10-CM | POA: Insufficient documentation

## 2019-07-21 DIAGNOSIS — K228 Other specified diseases of esophagus: Secondary | ICD-10-CM

## 2019-07-21 DIAGNOSIS — Z87891 Personal history of nicotine dependence: Secondary | ICD-10-CM | POA: Insufficient documentation

## 2019-07-21 DIAGNOSIS — Z79899 Other long term (current) drug therapy: Secondary | ICD-10-CM | POA: Insufficient documentation

## 2019-07-21 DIAGNOSIS — R1013 Epigastric pain: Secondary | ICD-10-CM

## 2019-07-21 DIAGNOSIS — I700 Atherosclerosis of aorta: Secondary | ICD-10-CM | POA: Insufficient documentation

## 2019-07-21 DIAGNOSIS — K3189 Other diseases of stomach and duodenum: Secondary | ICD-10-CM | POA: Diagnosis not present

## 2019-07-21 DIAGNOSIS — E039 Hypothyroidism, unspecified: Secondary | ICD-10-CM | POA: Insufficient documentation

## 2019-07-21 DIAGNOSIS — K2960 Other gastritis without bleeding: Secondary | ICD-10-CM | POA: Diagnosis not present

## 2019-07-21 HISTORY — PX: ESOPHAGOGASTRODUODENOSCOPY (EGD) WITH PROPOFOL: SHX5813

## 2019-07-21 SURGERY — ESOPHAGOGASTRODUODENOSCOPY (EGD) WITH PROPOFOL
Anesthesia: General

## 2019-07-21 MED ORDER — LIDOCAINE HCL (CARDIAC) PF 100 MG/5ML IV SOSY
PREFILLED_SYRINGE | INTRAVENOUS | Status: DC | PRN
  Administered 2019-07-21: 30 mg via INTRAVENOUS

## 2019-07-21 MED ORDER — PROPOFOL 10 MG/ML IV BOLUS
INTRAVENOUS | Status: DC | PRN
  Administered 2019-07-21 (×2): 50 mg via INTRAVENOUS
  Administered 2019-07-21: 150 mg via INTRAVENOUS
  Administered 2019-07-21: 30 mg via INTRAVENOUS

## 2019-07-21 MED ORDER — LACTATED RINGERS IV SOLN: 10.0000 mL/h | INTRAVENOUS | Status: DC

## 2019-07-21 SURGICAL SUPPLY — 33 items
BALLN DILATOR 10-12 8 (BALLOONS)
BALLN DILATOR 12-15 8 (BALLOONS)
BALLN DILATOR 15-18 8 (BALLOONS)
BALLN DILATOR CRE 0-12 8 (BALLOONS)
BALLN DILATOR ESOPH 8 10 CRE (MISCELLANEOUS) IMPLANT
BALLOON DILATOR 12-15 8 (BALLOONS) IMPLANT
BALLOON DILATOR 15-18 8 (BALLOONS) IMPLANT
BALLOON DILATOR CRE 0-12 8 (BALLOONS) IMPLANT
BLOCK BITE 60FR ADLT L/F GRN (MISCELLANEOUS) ×2 IMPLANT
CANISTER SUCT 1200ML W/VALVE (MISCELLANEOUS) ×2 IMPLANT
CLIP HMST 235XBRD CATH ROT (MISCELLANEOUS) IMPLANT
CLIP RESOLUTION 360 11X235 (MISCELLANEOUS)
ELECT REM PT RETURN 9FT ADLT (ELECTROSURGICAL)
ELECTRODE REM PT RTRN 9FT ADLT (ELECTROSURGICAL) IMPLANT
FCP ESCP3.2XJMB 240X2.8X (MISCELLANEOUS) ×1
FORCEPS BIOP RAD 4 LRG CAP 4 (CUTTING FORCEPS) IMPLANT
FORCEPS BIOP RJ4 240 W/NDL (MISCELLANEOUS) ×1
FORCEPS ESCP3.2XJMB 240X2.8X (MISCELLANEOUS) IMPLANT
GOWN CVR UNV OPN BCK APRN NK (MISCELLANEOUS) ×2 IMPLANT
GOWN ISOL THUMB LOOP REG UNIV (MISCELLANEOUS) ×2
INJECTOR VARIJECT VIN23 (MISCELLANEOUS) IMPLANT
KIT DEFENDO VALVE AND CONN (KITS) IMPLANT
KIT ENDO PROCEDURE OLY (KITS) ×2 IMPLANT
MARKER SPOT ENDO TATTOO 5ML (MISCELLANEOUS) IMPLANT
RETRIEVER NET PLAT FOOD (MISCELLANEOUS) IMPLANT
SNARE SHORT THROW 13M SML OVAL (MISCELLANEOUS) IMPLANT
SNARE SHORT THROW 30M LRG OVAL (MISCELLANEOUS) IMPLANT
SPOT EX ENDOSCOPIC TATTOO (MISCELLANEOUS)
SYR INFLATION 60ML (SYRINGE) IMPLANT
TRAP ETRAP POLY (MISCELLANEOUS) IMPLANT
VARIJECT INJECTOR VIN23 (MISCELLANEOUS)
WATER STERILE IRR 250ML POUR (IV SOLUTION) ×2 IMPLANT
WIRE CRE 18-20MM 8CM F G (MISCELLANEOUS) IMPLANT

## 2019-07-21 NOTE — Anesthesia Postprocedure Evaluation (Signed)
Anesthesia Post Note  Patient: Samantha Booker  Procedure(s) Performed: ESOPHAGOGASTRODUODENOSCOPY (EGD) WITH PROPOFOL (N/A )     Patient location during evaluation: PACU Anesthesia Type: General Level of consciousness: awake and alert and oriented Pain management: satisfactory to patient Vital Signs Assessment: post-procedure vital signs reviewed and stable Respiratory status: spontaneous breathing, nonlabored ventilation and respiratory function stable Cardiovascular status: blood pressure returned to baseline and stable Postop Assessment: Adequate PO intake and No signs of nausea or vomiting Anesthetic complications: no    Raliegh Ip

## 2019-07-21 NOTE — Op Note (Signed)
[facility] Gastroenterology Patient Name: Samantha Booker Procedure Date: 07/21/2019 9:17 AM MRN: [id number] Account #: [id number] Date of Birth: November 24, 1950 Admit Type: Outpatient Age: 69 Room: [facility] OR ROOM 01 Gender: Female Note Status: Finalized Procedure:             Upper GI endoscopy Indications:           Epigastric abdominal pain, Dyspepsia Providers:             Lin Landsman MD, MD Referring MD:          Leonie Douglas. Doy Hutching, MD (Referring MD) Medicines:             Monitored Anesthesia Care Complications:         No immediate complications. Estimated blood loss: None. Procedure:             Pre-Anesthesia Assessment:                        - Prior to the procedure, a History and Physical was                         performed, and patient medications and allergies were                         reviewed. The patient is competent. The risks and                         benefits of the procedure and the sedation options and                         risks were discussed with the patient. All questions                         were answered and informed consent was obtained.                         Patient identification and proposed procedure were                         verified by the physician, the nurse, the                         anesthesiologist, the anesthetist and the technician                         in the pre-procedure area in the procedure room in the                         endoscopy suite. Mental Status Examination: alert and                         oriented. Airway Examination: normal oropharyngeal                         airway and neck mobility. Respiratory Examination:                         clear to auscultation. CV Examination: normal.  Prophylactic Antibiotics: The patient does not require                         prophylactic antibiotics. Prior Anticoagulants: The                         patient has taken no  previous anticoagulant or                         antiplatelet agents. ASA Grade Assessment: II - A                         patient with mild systemic disease. After reviewing                         the risks and benefits, the patient was deemed in                         satisfactory condition to undergo the procedure. The                         anesthesia plan was to use monitored anesthesia care                         (MAC). Immediately prior to administration of                         medications, the patient was re-assessed for adequacy                         to receive sedatives. The heart rate, respiratory                         rate, oxygen saturations, blood pressure, adequacy of                         pulmonary ventilation, and response to care were                         monitored throughout the procedure. The physical                         status of the patient was re-assessed after the                         procedure.                        After obtaining informed consent, the endoscope was                         passed under direct vision. Throughout the procedure,                         the patient's blood pressure, pulse, and oxygen                         saturations were monitored continuously. The Endoscope  was introduced through the mouth, and advanced to the                         second part of duodenum. The upper GI endoscopy was                         accomplished without difficulty. The patient tolerated                         the procedure well. Findings:      The duodenal bulb and second portion of the duodenum were normal.      A few dispersed diminutive erosions with no bleeding and no stigmata of       recent bleeding were found in the gastric antrum. Biopsies were taken       with a cold forceps for Helicobacter pylori testing.      The gastric fundus and gastric body were normal. Biopsies were taken       with a  cold forceps for Helicobacter pylori testing.      The cardia and gastric fundus were normal on retroflexion.      Esophagogastric landmarks were identified: the gastroesophageal junction       was found at 36 cm from the incisors.      One tongue of salmon-colored mucosa was present from 35 to 36 cm. No       other visible abnormalities were present. The maximum longitudinal       extent of these esophageal mucosal changes was 1 cm in length. Biopsies       were taken with a cold forceps for histology. Impression:            - Normal duodenal bulb and second portion of the                         duodenum.                        - Erosive gastropathy with no bleeding and no stigmata                         of recent bleeding. Biopsied.                        - Normal gastric fundus and gastric body. Biopsied.                        - Esophagogastric landmarks identified.                        - Salmon-colored mucosa suspicious for short-segment                         Barrett's esophagus. Biopsied. Recommendation:        - Await pathology results.                        - Discharge patient to home (with escort).                        - Resume previous diet today.                        -  Continue present medications. Procedure Code(s):     --- Professional ---                        [phone], Esophagogastroduodenoscopy, flexible,                         transoral; with biopsy, single or multiple Diagnosis Code(s):     --- Professional ---                        K22.8, Other specified diseases of esophagus                        K31.89, Other diseases of stomach and duodenum                        R10.13, Epigastric pain CPT copyright 2019 American Medical Association. All rights reserved. The codes documented in this report are preliminary and upon coder review may  be revised to meet current compliance requirements. Dr. Ulyess Mort Lin Landsman MD, MD 07/21/2019 9:37:37  AM This report has been signed electronically. Number of Addenda: 0 Note Initiated On: 07/21/2019 9:17 AM Total Procedure Duration: 0 hours 6 minutes 31 seconds  Estimated Blood Loss:  Estimated blood loss: none.      [place]

## 2019-07-21 NOTE — Anesthesia Procedure Notes (Signed)
Performed by: Cameron Ali, CRNA Pre-anesthesia Checklist: Emergency Drugs available, Suction available, Timeout performed, Patient being monitored and Patient identified Patient Re-evaluated:Patient Re-evaluated prior to induction Oxygen Delivery Method: Nasal cannula Placement Confirmation: positive ETCO2

## 2019-07-21 NOTE — Anesthesia Preprocedure Evaluation (Signed)
Anesthesia Evaluation  Patient identified by MRN, date of birth, ID band Patient awake    Reviewed: Allergy & Precautions, H&P , NPO status , Patient's Chart, lab work & pertinent test results  Airway Mallampati: II  TM Distance: >3 FB Neck ROM: full    Dental no notable dental hx.    Pulmonary former smoker,    Pulmonary exam normal breath sounds clear to auscultation       Cardiovascular Normal cardiovascular exam Rhythm:regular Rate:Normal     Neuro/Psych    GI/Hepatic   Endo/Other  Hypothyroidism   Renal/GU      Musculoskeletal   Abdominal   Peds  Hematology   Anesthesia Other Findings   Reproductive/Obstetrics                             Anesthesia Physical Anesthesia Plan  ASA: II  Anesthesia Plan: General   Post-op Pain Management:    Induction: Intravenous  PONV Risk Score and Plan: 3 and Treatment may vary due to age or medical condition, TIVA and Propofol infusion  Airway Management Planned: Natural Airway  Additional Equipment:   Intra-op Plan:   Post-operative Plan:   Informed Consent: I have reviewed the patients History and Physical, chart, labs and discussed the procedure including the risks, benefits and alternatives for the proposed anesthesia with the patient or authorized representative who has indicated his/her understanding and acceptance.     Dental Advisory Given  Plan Discussed with: CRNA  Anesthesia Plan Comments:         Anesthesia Quick Evaluation  

## 2019-07-21 NOTE — H&P (Signed)
Cephas Darby, MD [address]  [place]  [place], [place] [address]  Main: [phone]  Fax: [phone] Pager: [phone]  Primary Care Physician:  Idelle Crouch, MD Primary Gastroenterologist:  Dr. Cephas Darby  Pre-Procedure History & Physical: HPI:  Samantha Booker is a 69 y.o. female is here for an endoscopy.   Past Medical History:  Diagnosis Date  . Atherosclerosis of aorta ([place]) 06/25/2016   Imaging Feb 2018  . Chicken pox   . DDD (degenerative disc disease), cervical   . DDD (degenerative disc disease), cervical   . 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 Hepatitis 1972   Hep B  . History of hepatitis B   . History of kidney stones    in past  . Hyperlipidemia   . Hypothyroidism   . Jaundice   . Thyroid disease     Past Surgical History:  Procedure Laterality Date  . ABDOMINAL HYSTERECTOMY  1980   precancerous cells  . APPENDECTOMY  1979  . CATARACT EXTRACTION Bilateral 2019  . CHOLECYSTECTOMY  2011  . COLONOSCOPY WITH PROPOFOL N/A 05/20/2018   Procedure: COLONOSCOPY WITH PROPOFOL;  Surgeon: Lin Landsman, MD;  Location: [facility] ENDOSCOPY;  Service: Gastroenterology;  Laterality: N/A;  . EYE SURGERY Left 10/05/2017  . INGUINAL HERNIA REPAIR Left 12/15/2018   Procedure: LAPAROSCOPIC LEFT INGUINAL HERNIA VS OPEN;  Surgeon: Herbert Pun, MD;  Location: [facility] ORS;  Service: General;  Laterality: Left;  . LAPAROSCOPIC UNILATERAL SALPINGO OOPHERECTOMY Right 12/15/2018   Procedure: LAPAROSCOPIC UNILATERAL OOPHORECTOMY;  Surgeon: Ward, Honor Loh, MD;  Location: [facility] ORS;  Service: Gynecology;  Laterality: Right;  . ROOT CANAL    . suburethral sling  2012   Dr. Audie Box    Prior to Admission medications   Medication Sig Start Date End Date Taking? Authorizing Provider  acetaminophen (TYLENOL) 500 MG tablet Take 1,000 mg by mouth every 6 (six) hours as needed (for pain.).   Yes [provider]    aspirin EC 81 MG tablet Take 1 tablet (81 mg total) by mouth daily. 07/10/17  Yes Lada, Satira Anis, MD  atorvastatin (LIPITOR) 40 MG tablet Take 1 tablet (40 mg total) by mouth at bedtime. Patient taking differently: Take 40 mg by mouth every morning.  06/25/18  Yes Lada, Satira Anis, MD  B Complex Vitamins (VITAMIN-B COMPLEX) TABS Take 1 tablet by mouth daily.    Yes [provider]  BIOTIN PO Take 1 tablet by mouth daily.   Yes [provider]  cholecalciferol (VITAMIN D3) 25 MCG (1000 UT) tablet Take 1,000 Units by mouth daily.   Yes [provider]  levothyroxine (SYNTHROID) 112 MCG tablet Take 112 mcg by mouth daily before breakfast. 10/19/18  Yes [provider]  LORazepam (ATIVAN) 0.5 MG tablet Take 0.5 mg by mouth 2 (two) times daily as needed. 04/13/19  Yes [provider]  meloxicam (MOBIC) 15 MG tablet Take 15 mg by mouth daily as needed for pain.  07/10/17  Yes Lada, Satira Anis, MD  Multiple Vitamins-Minerals (PRESERVISION AREDS PO) Take 1 tablet by mouth daily.    Yes [provider]  acyclovir cream (ZOVIRAX) 5 % Apply 1 application topically every 4 (four) hours as needed (for cold sores).  06/01/18   [provider]  clobetasol (TEMOVATE) 0.05 % external solution as needed.  05/27/19   [provider]  metroNIDAZOLE (METROCREAM) 0.75 % cream Apply topically daily.  Patient taking differently: Apply 1 application topically daily. face 07/10/17   Arnetha Courser, MD  PREMARIN 1.25 MG tablet Take 1.25 mg by mouth daily. 04/13/19   [provider]  Probiotic Product (PROBIOTIC PO) Take 1 tablet by mouth daily.     [provider]  promethazine (PHENERGAN) 25 MG tablet Take 25 mg by mouth every 6 (six) hours as needed. 04/13/19   [provider]    Allergies as of 06/28/2019 - Review Complete 06/28/2019  Allergen Reaction Noted  . Penicillins Other (See Comments) 03/07/2010    Family History   Problem Relation Age of Onset  . Diabetes Mother   . Heart disease Mother        CABG 2007  . Hyperlipidemia Mother   . COPD Mother   . Hyperlipidemia Father   . Heart disease Father   . Cancer Maternal Grandmother        breast  . Cancer Maternal Aunt        breast  . Parkinson's disease Brother   . Cancer Cousin        brain  . Cancer Cousin        melanoma    Social History   Socioeconomic History  . Marital status: Married    Spouse name: Not on file  . Number of children: 2  . Years of education: Not on file  . Highest education level: Not on file  Occupational History  . Occupation: retired  Tobacco Use  . Smoking status: Former Smoker    Packs/day: 2.00    Years: 20.00    Pack years: 40.00    Types: Cigarettes    Quit date: 12/09/1991    Years since quitting: 27.6  . Smokeless tobacco: Never Used  Substance and Sexual Activity  . Alcohol use: Yes    Alcohol/week: 2.0 standard drinks    Types: 2 Glasses of wine per week    Comment: socially  . Drug use: No  . Sexual activity: Yes    Partners: Male  Other Topics Concern  . Not on file  Social History Narrative   Lives in [place]. Married. 2 children, 1 living in [place], 1 in [place]   Social Determinants of Health   Financial Resource Strain:   . Difficulty of Paying Living Expenses:   Food Insecurity:   . Worried About Charity fundraiser in the Last Year:   . Arboriculturist in the Last Year:   Transportation Needs:   . Film/video editor (Medical):   Marland Kitchen Lack of Transportation (Non-Medical):   Physical Activity:   . Days of Exercise per Week:   . Minutes of Exercise per Session:   Stress:   . Feeling of Stress :   Social Connections:   . Frequency of Communication with Friends and Family:   . Frequency of Social Gatherings with Friends and Family:   . Attends Religious Services:   . Active Member of Clubs or Organizations:   . Attends Archivist Meetings:   Marland Kitchen Marital  Status:   Intimate Partner Violence:   . Fear of Current or Ex-Partner:   . Emotionally Abused:   Marland Kitchen Physically Abused:   . Sexually Abused:     Review of Systems: See HPI, otherwise negative ROS  Physical Exam: BP 136/86   Pulse 70   Temp (!) 97.5 F (36.4 C) (Temporal)   Ht 5' 2.5" (1.588 m)   Wt 51.3 kg  SpO2 100%   BMI 20.34 kg/m  General:   Alert,  pleasant and cooperative in NAD Head:  Normocephalic and atraumatic. Neck:  Supple; no masses or thyromegaly. Lungs:  Clear throughout to auscultation.    Heart:  Regular rate and rhythm. Abdomen:  Soft, nontender and nondistended. Normal bowel sounds, without guarding, and without rebound.   Neurologic:  Alert and  oriented x4;  grossly normal neurologically.  Impression/Plan: Samantha Booker is here for an endoscopy to be performed for Nausea, epigastric tenderness, lack of appetite  Risks, benefits, limitations, and alternatives regarding  endoscopy have been reviewed with the patient.  Questions have been answered.  All parties agreeable.   Sherri Sear, MD  07/21/2019, 9:13 AM

## 2019-07-21 NOTE — Transfer of Care (Signed)
Immediate Anesthesia Transfer of Care Note  Patient: Samantha Booker  Procedure(s) Performed: ESOPHAGOGASTRODUODENOSCOPY (EGD) WITH PROPOFOL (N/A )  Patient Location: PACU  Anesthesia Type: General  Level of Consciousness: awake, alert  and patient cooperative  Airway and Oxygen Therapy: Patient Spontanous Breathing and Patient connected to supplemental oxygen  Post-op Assessment: Post-op Vital signs reviewed, Patient's Cardiovascular Status Stable, Respiratory Function Stable, Patent Airway and No signs of Nausea or vomiting  Post-op Vital Signs: Reviewed and stable  Complications: No apparent anesthesia complications

## 2019-07-22 LAB — SURGICAL PATHOLOGY

## 2019-08-24 ENCOUNTER — Encounter: Payer: Self-pay | Admitting: Gastroenterology

## 2019-08-24 ENCOUNTER — Other Ambulatory Visit: Payer: Self-pay

## 2019-08-24 ENCOUNTER — Ambulatory Visit (INDEPENDENT_AMBULATORY_CARE_PROVIDER_SITE_OTHER): Payer: PPO | Admitting: Gastroenterology

## 2019-08-24 VITALS — BP 147/88 | HR 72 | Temp 97.6°F | Wt 113.5 lb

## 2019-08-24 DIAGNOSIS — K644 Residual hemorrhoidal skin tags: Secondary | ICD-10-CM

## 2019-08-24 DIAGNOSIS — R1013 Epigastric pain: Secondary | ICD-10-CM

## 2019-08-24 MED ORDER — HYDROCORTISONE (PERIANAL) 2.5 % EX CREA: 1.0000 | TOPICAL_CREAM | Freq: Two times a day (BID) | CUTANEOUS | 1 refills | Status: DC

## 2019-08-24 NOTE — Progress Notes (Signed)
Cephas Darby, MD [address]  [place]  [place], [place] [address]  Main: [phone]  Fax: [phone]    Gastroenterology Consultation  Referring Provider:     Idelle Crouch, MD Primary Care Physician:  Idelle Crouch, MD Primary Gastroenterologist:  Dr. Cephas Darby Reason for Consultation:    Grade 3 symptomatic external hemorrhoids        HPI:   Samantha Booker is a 69 y.o. Caucasian female referred by Dr. Doy Hutching, Leonie Douglas, MD  for consultation & management of possible rectal mass and colonoscopy.  Patient reports that she has been experiencing severe rectal discomfort associated with itching, burning, irritation and intermittent rectal bleeding since 01/2018.  She saw Dr. Sanda Klein who was concerned about possible rectal mass.  Patient reports that she was having hemorrhoidal symptoms intermittently but has noticed protrusion of soft tissue outside the anal canal which has to be manually reduced in the last 3 months.  She denies constipation, diarrhea, abdominal pain.  She lost some weight intentionally and she is following low-fat low-carb diet.  She denies any other GI symptoms today.  Patient has tried Preparation H with no relief.  She has history of hypothyroidism, currently on Synthroid.  Most recent TSH on 04/29/2018 came back elevated.  Hemoglobin is normal, LFTs are normal.  Follow-up visit 06/08/2018 Patient underwent colonoscopy which revealed large external hemorrhoids, 2 subcentimeter tubular adenomas without dysplasia which were removed.  Patient is here to discuss about hemorrhoid ligation.  She reports that her hemorrhoid symptoms have significantly improved, she continues to have prolapse of the hemorrhoids that she has to manually reduce sometimes as well as itching for which she is doing witch hazel and Tucks pads.  Follow-up visit 06/28/19 Patient underwent repair of the left inguinal hernia as well as right oophorectomy in 11/2018.  Patient reports  that since surgery, she has lost weight, reports feeling nauseous, lack of appetite.  She denies any lower abdominal pain, constipation or rectal bleeding.  She is referred here by Dr. Doy Hutching due to nausea and lack of appetite.  She also reports that she has been experiencing severe neck pain needing steroid injections that she thinks has resulted in nausea from pain.  Her weight has been stable around 110 pounds, she is unable to gain weight.  Her labs in 03/2019 are unremarkable for CBC, CMP, HbA1c 6.1, normal TSH and free T4  Follow-up visit 08/24/2019 Patient reports that her upper GI symptoms have resolved.  She gained 2 pounds since last visit.  Her EGD was unremarkable including biopsies.  Her GI symptoms are most likely secondary to medication side effect.  Her neck pain has resolved and now manageable.  Today, she wanted to discuss about her hemorrhoidal symptoms including perianal itching, burning and irritation for which she uses Anusol cream as needed along with Tucks pads.  NSAIDs: None  Antiplts/Anticoagulants/Anti thrombotics: None  GI Procedures: Colonoscopy in 2005, reportedly normal Colonoscopy 04/2018 - Hemorrhoids found on perianal exam. - One 6 mm polyp in the ascending colon, removed with a cold snare. Resected and retrieved. - One 8 mm polyp in the descending colon, removed with a hot snare. Resected and retrieved. - Non-bleeding external hemorrhoids. DIAGNOSIS:  A. COLON POLYP X 1, ASCENDING; COLD SNARE:  - SESSILE SERRATED POLYP (ADENOMA), 2 FRAGMENTS.  - NEGATIVE FOR DYSPLASIA AND MALIGNANCY.   B. COLON POLYP X 1, DESCENDING; HOT SNARE:  - CAUTERIZED POLYP, FAVOR SERRATED LESION.  - NO DYSPLASIA  OR MALIGNANCY IS IDENTIFIED BUT ARTIFACT LIMITS  INTERPRETATION.   Upper endoscopy 07/21/2019 - Normal duodenal bulb and second portion of the duodenum. - Erosive gastropathy with no bleeding and no stigmata of recent bleeding. Biopsied. - Normal gastric fundus and gastric  body. Biopsied. - Esophagogastric landmarks identified. - Salmon-colored mucosa suspicious for short-segment Barrett's esophagus. Biopsied.  DIAGNOSIS:  A. STOMACH, RANDOM; COLD BIOPSY:  - ANTRAL MUCOSA WITH MILD REACTIVE GASTROPATHY AND CHANGES CONSISTENT  WITH HEALING MUCOSAL EROSION.  - UNREMARKABLE OXYNTIC MUCOSA.  - NEGATIVE FOR H. PYLORI, DYSPLASIA, AND MALIGNANCY.   B. GEJ, SALMON-COLORED MUCOSA; COLD BIOPSY:  - FRAGMENT OF SQUAMOUS AND GASTRIC- CARDIAC TYPE MUCOSA WITH NO  SIGNIFICANT PATHOLOGIC ABNORMALITY.  - NEGATIVE FOR INTESTINAL METAPLASIA, DYSPLASIA, AND MALIGNANCY.    Past Medical History:  Diagnosis Date  . Atherosclerosis of aorta ([place]) 06/25/2016   Imaging Feb 2018  . Chicken pox   . DDD (degenerative disc disease), cervical   . DDD (degenerative disc disease), cervical   . 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 Hepatitis 1972   Hep B  . History of hepatitis B   . History of kidney stones    in past  . Hyperlipidemia   . Hypothyroidism   . Jaundice   . Thyroid disease     Past Surgical History:  Procedure Laterality Date  . ABDOMINAL HYSTERECTOMY  1980   precancerous cells  . APPENDECTOMY  1979  . CATARACT EXTRACTION Bilateral 2019  . CHOLECYSTECTOMY  2011  . COLONOSCOPY WITH PROPOFOL N/A 05/20/2018   Procedure: COLONOSCOPY WITH PROPOFOL;  Surgeon: Lin Landsman, MD;  Location: [facility] ENDOSCOPY;  Service: Gastroenterology;  Laterality: N/A;  . ESOPHAGOGASTRODUODENOSCOPY (EGD) WITH PROPOFOL N/A 07/21/2019   Procedure: ESOPHAGOGASTRODUODENOSCOPY (EGD) WITH PROPOFOL;  Surgeon: Lin Landsman, MD;  Location: [place];  Service: Endoscopy;  Laterality: N/A;  . EYE SURGERY Left 10/05/2017  . INGUINAL HERNIA REPAIR Left 12/15/2018   Procedure: LAPAROSCOPIC LEFT INGUINAL HERNIA VS OPEN;  Surgeon: Herbert Pun, MD;  Location: [facility] ORS;  Service: General;  Laterality: Left;  . LAPAROSCOPIC UNILATERAL  SALPINGO OOPHERECTOMY Right 12/15/2018   Procedure: LAPAROSCOPIC UNILATERAL OOPHORECTOMY;  Surgeon: Ward, Honor Loh, MD;  Location: [facility] ORS;  Service: Gynecology;  Laterality: Right;  . ROOT CANAL    . suburethral sling  2012   Dr. Audie Box   Current Outpatient Medications:  .  acetaminophen (TYLENOL) 500 MG tablet, Take 1,000 mg by mouth every 6 (six) hours as needed (for pain.)., Disp: , Rfl:  .  acyclovir cream (ZOVIRAX) 5 %, Apply 1 application topically every 4 (four) hours as needed (for cold sores). , Disp: , Rfl:  .  aspirin EC 81 MG tablet, Take 1 tablet (81 mg total) by mouth daily., Disp: , Rfl:  .  atorvastatin (LIPITOR) 40 MG tablet, Take 1 tablet (40 mg total) by mouth at bedtime. (Patient taking differently: Take 40 mg by mouth every morning. ), Disp: 90 tablet, Rfl: 1 .  B Complex Vitamins (VITAMIN-B COMPLEX) TABS, Take 1 tablet by mouth daily. , Disp: , Rfl:  .  BIOTIN PO, Take 1 tablet by mouth daily., Disp: , Rfl:  .  cholecalciferol (VITAMIN D3) 25 MCG (1000 UT) tablet, Take 1,000 Units by mouth daily., Disp: , Rfl:  .  levothyroxine (SYNTHROID) 112 MCG tablet, Take 112 mcg by mouth daily before breakfast., Disp: , Rfl:  .  LORazepam (ATIVAN) 0.5 MG tablet, Take 0.5  mg by mouth 2 (two) times daily as needed., Disp: , Rfl:  .  meloxicam (MOBIC) 15 MG tablet, Take 15 mg by mouth daily as needed for pain. , Disp: , Rfl:  .  metroNIDAZOLE (METROCREAM) 0.75 % cream, Apply topically daily. (Patient taking differently: Apply 1 application topically daily. face), Disp: 45 g, Rfl: 11 .  Multiple Vitamins-Minerals (PRESERVISION AREDS PO), Take 1 tablet by mouth daily. , Disp: , Rfl:  .  PREMARIN 1.25 MG tablet, Take 1.25 mg by mouth daily., Disp: , Rfl:  .  hydrocortisone (ANUSOL-HC) 2.5 % rectal cream, Place 1 application rectally 2 (two) times daily., Disp: 30 g, Rfl: 1   Family History  Problem Relation Age of Onset  . Diabetes Mother   . Heart disease Mother        CABG 2007    . Hyperlipidemia Mother   . COPD Mother   . Hyperlipidemia Father   . Heart disease Father   . Cancer Maternal Grandmother        breast  . Cancer Maternal Aunt        breast  . Parkinson's disease Brother   . Cancer Cousin        brain  . Cancer Cousin        melanoma     Social History   Tobacco Use  . Smoking status: Former Smoker    Packs/day: 2.00    Years: 20.00    Pack years: 40.00    Types: Cigarettes    Quit date: 12/09/1991    Years since quitting: 27.7  . Smokeless tobacco: Never Used  Substance Use Topics  . Alcohol use: Yes    Alcohol/week: 2.0 standard drinks    Types: 2 Glasses of wine per week    Comment: socially  . Drug use: No    Allergies as of 08/24/2019 - Review Complete 08/24/2019  Allergen Reaction Noted  . Penicillins Other (See Comments) 03/07/2010    Review of Systems:    All systems reviewed and negative except where noted in HPI.   Physical Exam:  BP (!) 147/88 (BP Location: Left Arm, Patient Position: Sitting, Cuff Size: Large)   Pulse 72   Temp 97.6 F (36.4 C) (Oral)   Wt 113 lb 8 oz (51.5 kg)   BMI 20.43 kg/m  No LMP recorded. Patient has had a hysterectomy.  General:   Alert,  Well-developed, well-nourished, pleasant and cooperative in NAD Head:  Normocephalic and atraumatic. Eyes:  Sclera clear, no icterus.   Conjunctiva pink. Ears:  Normal auditory acuity. Nose:  No deformity, discharge, or lesions. Mouth:  No deformity or lesions,oropharynx pink & moist. Neck:  Supple; no masses or thyromegaly. Lungs:  Respirations even and unlabored.  Clear throughout to auscultation.   No wheezes, crackles, or rhonchi. No acute distress. Heart:  Regular rate and rhythm; no murmurs, clicks, rubs, or gallops. Abdomen:  Normal bowel sounds. Soft, nontender, and non-distended without masses, hepatosplenomegaly or hernias noted.  No guarding or rebound tenderness.   Rectal: Skin tags Msk:  Symmetrical without gross deformities. Good,  equal movement & strength bilaterally. Pulses:  Normal pulses noted. Extremities:  No clubbing or edema.  No cyanosis. Neurologic:  Alert and oriented x3;  grossly normal neurologically. Skin:  Intact without significant lesions or rashes. No jaundice. Psych:  Alert and cooperative. Normal mood and affect.  Imaging Studies: None  Assessment and Plan:   CORAL RAIS is a 69 y.o. Caucasian female with history  of symptomatic, grade 3 external hemorrhoids s/p hemorrhoid ligation, s/p elective inguinal hernia repair seen for nausea and lack of appetite  Nausea, epigastric tenderness, lack of appetite: Steroid-induced Symptoms are currently resolved EGD with biopsies are unremarkable  Tubular adenomas of colon Recommend surveillance colonoscopy in 04/2023  Skin tags and external hemorrhoids S/p hemorrhoid ligation in the past Her symptoms are most likely secondary to large perianal skin tags.  Advised her to avoid prolonged wiping with dry toilet paper Anusol cream as needed Discussed with her that I can refer her to colorectal surgery for removal of skin tags if needed.  Patient will let me know if her symptoms are occurring frequently   Follow up as needed   Cephas Darby, MD

## 2019-08-25 DIAGNOSIS — R739 Hyperglycemia, unspecified: Secondary | ICD-10-CM | POA: Diagnosis not present

## 2019-08-25 DIAGNOSIS — M5030 Other cervical disc degeneration, unspecified cervical region: Secondary | ICD-10-CM | POA: Diagnosis not present

## 2019-08-25 DIAGNOSIS — Z0000 Encounter for general adult medical examination without abnormal findings: Secondary | ICD-10-CM | POA: Diagnosis not present

## 2019-08-25 DIAGNOSIS — Z79899 Other long term (current) drug therapy: Secondary | ICD-10-CM | POA: Diagnosis not present

## 2019-08-25 DIAGNOSIS — E782 Mixed hyperlipidemia: Secondary | ICD-10-CM | POA: Diagnosis not present

## 2019-08-25 DIAGNOSIS — E039 Hypothyroidism, unspecified: Secondary | ICD-10-CM | POA: Diagnosis not present

## 2019-12-21 DIAGNOSIS — H43391 Other vitreous opacities, right eye: Secondary | ICD-10-CM | POA: Diagnosis not present

## 2019-12-21 DIAGNOSIS — H43811 Vitreous degeneration, right eye: Secondary | ICD-10-CM | POA: Diagnosis not present

## 2020-01-12 DIAGNOSIS — H43391 Other vitreous opacities, right eye: Secondary | ICD-10-CM | POA: Diagnosis not present

## 2020-01-12 DIAGNOSIS — H26491 Other secondary cataract, right eye: Secondary | ICD-10-CM | POA: Diagnosis not present

## 2020-01-12 DIAGNOSIS — H43311 Vitreous membranes and strands, right eye: Secondary | ICD-10-CM | POA: Diagnosis not present

## 2020-01-13 DIAGNOSIS — H43391 Other vitreous opacities, right eye: Secondary | ICD-10-CM | POA: Diagnosis not present

## 2020-02-27 DIAGNOSIS — Z1231 Encounter for screening mammogram for malignant neoplasm of breast: Secondary | ICD-10-CM | POA: Diagnosis not present

## 2020-02-27 DIAGNOSIS — E039 Hypothyroidism, unspecified: Secondary | ICD-10-CM | POA: Diagnosis not present

## 2020-02-27 DIAGNOSIS — E782 Mixed hyperlipidemia: Secondary | ICD-10-CM | POA: Diagnosis not present

## 2020-02-27 DIAGNOSIS — Z0000 Encounter for general adult medical examination without abnormal findings: Secondary | ICD-10-CM | POA: Diagnosis not present

## 2020-02-27 DIAGNOSIS — R7309 Other abnormal glucose: Secondary | ICD-10-CM | POA: Diagnosis not present

## 2020-02-27 DIAGNOSIS — R7989 Other specified abnormal findings of blood chemistry: Secondary | ICD-10-CM | POA: Diagnosis not present

## 2020-02-27 DIAGNOSIS — Z79899 Other long term (current) drug therapy: Secondary | ICD-10-CM | POA: Diagnosis not present

## 2020-02-27 DIAGNOSIS — I700 Atherosclerosis of aorta: Secondary | ICD-10-CM | POA: Diagnosis not present

## 2020-02-27 DIAGNOSIS — R739 Hyperglycemia, unspecified: Secondary | ICD-10-CM | POA: Diagnosis not present

## 2020-02-28 DIAGNOSIS — E782 Mixed hyperlipidemia: Secondary | ICD-10-CM | POA: Diagnosis not present

## 2020-02-28 DIAGNOSIS — R7309 Other abnormal glucose: Secondary | ICD-10-CM | POA: Diagnosis not present

## 2020-02-28 DIAGNOSIS — Z79899 Other long term (current) drug therapy: Secondary | ICD-10-CM | POA: Diagnosis not present

## 2020-02-28 DIAGNOSIS — E039 Hypothyroidism, unspecified: Secondary | ICD-10-CM | POA: Diagnosis not present

## 2020-03-13 ENCOUNTER — Other Ambulatory Visit: Payer: Self-pay | Admitting: Internal Medicine

## 2020-03-13 ENCOUNTER — Ambulatory Visit: Payer: PPO | Attending: Internal Medicine

## 2020-03-13 DIAGNOSIS — Z23 Encounter for immunization: Secondary | ICD-10-CM

## 2020-03-13 NOTE — Progress Notes (Signed)
   Covid-19 Vaccination Clinic  Name:  Samantha Booker    MRN: [id number] DOB: 1950/05/12  03/13/2020  [name] was observed post Covid-19 immunization for 15 minutes without incident. She was provided with Vaccine Information Sheet and instruction to access the V-Safe system.   [name] was instructed to call 911 with any severe reactions post vaccine: Marland Kitchen Difficulty breathing  . Swelling of face and throat  . A fast heartbeat  . A bad rash all over body  . Dizziness and weakness   Immunizations Administered    Name Date Dose VIS Date Route   Pfizer COVID-19 Vaccine 03/13/2020 10:17 AM 0.3 mL 02/15/2020 Intramuscular   Manufacturer: [place]   Lot: [id number]   [place]: [id number]

## 2020-05-08 DIAGNOSIS — J40 Bronchitis, not specified as acute or chronic: Secondary | ICD-10-CM | POA: Diagnosis not present

## 2020-05-08 DIAGNOSIS — Z20822 Contact with and (suspected) exposure to covid-19: Secondary | ICD-10-CM | POA: Diagnosis not present

## 2020-05-08 DIAGNOSIS — R6889 Other general symptoms and signs: Secondary | ICD-10-CM | POA: Diagnosis not present

## 2020-07-02 DIAGNOSIS — Z1231 Encounter for screening mammogram for malignant neoplasm of breast: Secondary | ICD-10-CM | POA: Diagnosis not present

## 2020-08-22 DIAGNOSIS — M5412 Radiculopathy, cervical region: Secondary | ICD-10-CM | POA: Diagnosis not present

## 2020-08-22 DIAGNOSIS — M1611 Unilateral primary osteoarthritis, right hip: Secondary | ICD-10-CM | POA: Diagnosis not present

## 2020-08-22 DIAGNOSIS — M1711 Unilateral primary osteoarthritis, right knee: Secondary | ICD-10-CM | POA: Diagnosis not present

## 2020-08-22 DIAGNOSIS — M542 Cervicalgia: Secondary | ICD-10-CM | POA: Diagnosis not present

## 2020-08-22 DIAGNOSIS — M47812 Spondylosis without myelopathy or radiculopathy, cervical region: Secondary | ICD-10-CM | POA: Diagnosis not present

## 2020-08-22 DIAGNOSIS — M5030 Other cervical disc degeneration, unspecified cervical region: Secondary | ICD-10-CM | POA: Diagnosis not present

## 2020-08-22 DIAGNOSIS — M75101 Unspecified rotator cuff tear or rupture of right shoulder, not specified as traumatic: Secondary | ICD-10-CM | POA: Diagnosis not present

## 2020-08-27 DIAGNOSIS — N393 Stress incontinence (female) (male): Secondary | ICD-10-CM | POA: Diagnosis not present

## 2020-08-27 DIAGNOSIS — E039 Hypothyroidism, unspecified: Secondary | ICD-10-CM | POA: Diagnosis not present

## 2020-08-27 DIAGNOSIS — I10 Essential (primary) hypertension: Secondary | ICD-10-CM | POA: Diagnosis not present

## 2020-08-27 DIAGNOSIS — R739 Hyperglycemia, unspecified: Secondary | ICD-10-CM | POA: Diagnosis not present

## 2020-08-27 DIAGNOSIS — Z79899 Other long term (current) drug therapy: Secondary | ICD-10-CM | POA: Diagnosis not present

## 2020-08-27 DIAGNOSIS — E782 Mixed hyperlipidemia: Secondary | ICD-10-CM | POA: Diagnosis not present

## 2020-08-27 DIAGNOSIS — R7989 Other specified abnormal findings of blood chemistry: Secondary | ICD-10-CM | POA: Diagnosis not present

## 2020-08-27 DIAGNOSIS — M8580 Other specified disorders of bone density and structure, unspecified site: Secondary | ICD-10-CM | POA: Diagnosis not present

## 2020-08-28 DIAGNOSIS — R739 Hyperglycemia, unspecified: Secondary | ICD-10-CM | POA: Diagnosis not present

## 2020-08-28 DIAGNOSIS — Z79899 Other long term (current) drug therapy: Secondary | ICD-10-CM | POA: Diagnosis not present

## 2020-08-28 DIAGNOSIS — I10 Essential (primary) hypertension: Secondary | ICD-10-CM | POA: Diagnosis not present

## 2020-08-28 DIAGNOSIS — R7989 Other specified abnormal findings of blood chemistry: Secondary | ICD-10-CM | POA: Diagnosis not present

## 2020-08-28 DIAGNOSIS — E039 Hypothyroidism, unspecified: Secondary | ICD-10-CM | POA: Diagnosis not present

## 2020-08-28 DIAGNOSIS — E782 Mixed hyperlipidemia: Secondary | ICD-10-CM | POA: Diagnosis not present

## 2020-08-29 DIAGNOSIS — M5412 Radiculopathy, cervical region: Secondary | ICD-10-CM | POA: Diagnosis not present

## 2020-08-29 DIAGNOSIS — M5030 Other cervical disc degeneration, unspecified cervical region: Secondary | ICD-10-CM | POA: Diagnosis not present

## 2020-08-29 DIAGNOSIS — M4802 Spinal stenosis, cervical region: Secondary | ICD-10-CM | POA: Diagnosis not present

## 2020-09-25 DIAGNOSIS — N3281 Overactive bladder: Secondary | ICD-10-CM | POA: Diagnosis not present

## 2020-09-25 DIAGNOSIS — N763 Subacute and chronic vulvitis: Secondary | ICD-10-CM | POA: Diagnosis not present

## 2020-10-01 DIAGNOSIS — M47812 Spondylosis without myelopathy or radiculopathy, cervical region: Secondary | ICD-10-CM | POA: Diagnosis not present

## 2020-10-01 DIAGNOSIS — M5030 Other cervical disc degeneration, unspecified cervical region: Secondary | ICD-10-CM | POA: Diagnosis not present

## 2020-10-01 DIAGNOSIS — M5412 Radiculopathy, cervical region: Secondary | ICD-10-CM | POA: Diagnosis not present

## 2020-10-15 DIAGNOSIS — M8588 Other specified disorders of bone density and structure, other site: Secondary | ICD-10-CM | POA: Diagnosis not present

## 2020-11-05 DIAGNOSIS — M5030 Other cervical disc degeneration, unspecified cervical region: Secondary | ICD-10-CM | POA: Diagnosis not present

## 2020-11-05 DIAGNOSIS — M5412 Radiculopathy, cervical region: Secondary | ICD-10-CM | POA: Diagnosis not present

## 2020-11-05 DIAGNOSIS — M62838 Other muscle spasm: Secondary | ICD-10-CM | POA: Diagnosis not present

## 2020-11-21 DIAGNOSIS — M5412 Radiculopathy, cervical region: Secondary | ICD-10-CM | POA: Diagnosis not present

## 2020-11-21 DIAGNOSIS — M4802 Spinal stenosis, cervical region: Secondary | ICD-10-CM | POA: Diagnosis not present

## 2020-12-18 DIAGNOSIS — R0602 Shortness of breath: Secondary | ICD-10-CM | POA: Diagnosis not present

## 2020-12-18 DIAGNOSIS — R0981 Nasal congestion: Secondary | ICD-10-CM | POA: Diagnosis not present

## 2020-12-18 DIAGNOSIS — J069 Acute upper respiratory infection, unspecified: Secondary | ICD-10-CM | POA: Diagnosis not present

## 2020-12-18 DIAGNOSIS — R5383 Other fatigue: Secondary | ICD-10-CM | POA: Diagnosis not present

## 2020-12-18 DIAGNOSIS — H9209 Otalgia, unspecified ear: Secondary | ICD-10-CM | POA: Diagnosis not present

## 2020-12-21 DIAGNOSIS — M5030 Other cervical disc degeneration, unspecified cervical region: Secondary | ICD-10-CM | POA: Diagnosis not present

## 2020-12-21 DIAGNOSIS — M5412 Radiculopathy, cervical region: Secondary | ICD-10-CM | POA: Diagnosis not present

## 2020-12-21 DIAGNOSIS — M4802 Spinal stenosis, cervical region: Secondary | ICD-10-CM | POA: Diagnosis not present

## 2020-12-21 DIAGNOSIS — M62838 Other muscle spasm: Secondary | ICD-10-CM | POA: Diagnosis not present

## 2021-02-27 DIAGNOSIS — E782 Mixed hyperlipidemia: Secondary | ICD-10-CM | POA: Diagnosis not present

## 2021-02-27 DIAGNOSIS — I700 Atherosclerosis of aorta: Secondary | ICD-10-CM | POA: Diagnosis not present

## 2021-02-27 DIAGNOSIS — Z23 Encounter for immunization: Secondary | ICD-10-CM | POA: Diagnosis not present

## 2021-02-27 DIAGNOSIS — Z0000 Encounter for general adult medical examination without abnormal findings: Secondary | ICD-10-CM | POA: Diagnosis not present

## 2021-02-27 DIAGNOSIS — Z79899 Other long term (current) drug therapy: Secondary | ICD-10-CM | POA: Diagnosis not present

## 2021-02-27 DIAGNOSIS — E039 Hypothyroidism, unspecified: Secondary | ICD-10-CM | POA: Diagnosis not present

## 2021-02-27 DIAGNOSIS — R739 Hyperglycemia, unspecified: Secondary | ICD-10-CM | POA: Diagnosis not present

## 2021-02-27 DIAGNOSIS — I10 Essential (primary) hypertension: Secondary | ICD-10-CM | POA: Diagnosis not present

## 2021-02-27 DIAGNOSIS — M2550 Pain in unspecified joint: Secondary | ICD-10-CM | POA: Diagnosis not present

## 2021-02-27 DIAGNOSIS — R8290 Unspecified abnormal findings in urine: Secondary | ICD-10-CM | POA: Diagnosis not present

## 2021-02-27 DIAGNOSIS — R7989 Other specified abnormal findings of blood chemistry: Secondary | ICD-10-CM | POA: Diagnosis not present

## 2021-02-27 DIAGNOSIS — L989 Disorder of the skin and subcutaneous tissue, unspecified: Secondary | ICD-10-CM | POA: Diagnosis not present

## 2021-06-05 DIAGNOSIS — E782 Mixed hyperlipidemia: Secondary | ICD-10-CM | POA: Diagnosis not present

## 2021-06-05 DIAGNOSIS — E039 Hypothyroidism, unspecified: Secondary | ICD-10-CM | POA: Diagnosis not present

## 2021-06-05 DIAGNOSIS — R739 Hyperglycemia, unspecified: Secondary | ICD-10-CM | POA: Diagnosis not present

## 2021-06-05 DIAGNOSIS — Z79899 Other long term (current) drug therapy: Secondary | ICD-10-CM | POA: Diagnosis not present

## 2021-06-05 DIAGNOSIS — Z1231 Encounter for screening mammogram for malignant neoplasm of breast: Secondary | ICD-10-CM | POA: Diagnosis not present

## 2021-06-05 DIAGNOSIS — I10 Essential (primary) hypertension: Secondary | ICD-10-CM | POA: Diagnosis not present

## 2021-06-10 ENCOUNTER — Ambulatory Visit: Payer: PPO | Admitting: Dermatology

## 2021-07-03 DIAGNOSIS — Z1231 Encounter for screening mammogram for malignant neoplasm of breast: Secondary | ICD-10-CM | POA: Diagnosis not present

## 2021-07-18 DIAGNOSIS — K601 Chronic anal fissure: Secondary | ICD-10-CM | POA: Diagnosis not present

## 2021-07-18 DIAGNOSIS — K644 Residual hemorrhoidal skin tags: Secondary | ICD-10-CM | POA: Diagnosis not present

## 2021-08-14 DIAGNOSIS — I700 Atherosclerosis of aorta: Secondary | ICD-10-CM | POA: Diagnosis not present

## 2021-08-14 DIAGNOSIS — M1612 Unilateral primary osteoarthritis, left hip: Secondary | ICD-10-CM | POA: Diagnosis not present

## 2021-08-14 DIAGNOSIS — M5030 Other cervical disc degeneration, unspecified cervical region: Secondary | ICD-10-CM | POA: Diagnosis not present

## 2021-08-14 DIAGNOSIS — M47812 Spondylosis without myelopathy or radiculopathy, cervical region: Secondary | ICD-10-CM | POA: Diagnosis not present

## 2021-08-14 DIAGNOSIS — M25552 Pain in left hip: Secondary | ICD-10-CM | POA: Diagnosis not present

## 2021-08-14 DIAGNOSIS — M5412 Radiculopathy, cervical region: Secondary | ICD-10-CM | POA: Diagnosis not present

## 2021-08-15 DIAGNOSIS — K601 Chronic anal fissure: Secondary | ICD-10-CM | POA: Diagnosis not present

## 2021-08-15 DIAGNOSIS — K644 Residual hemorrhoidal skin tags: Secondary | ICD-10-CM | POA: Diagnosis not present

## 2021-08-23 DIAGNOSIS — M4802 Spinal stenosis, cervical region: Secondary | ICD-10-CM | POA: Diagnosis not present

## 2021-08-23 DIAGNOSIS — M5412 Radiculopathy, cervical region: Secondary | ICD-10-CM | POA: Diagnosis not present

## 2021-08-26 DIAGNOSIS — Z79899 Other long term (current) drug therapy: Secondary | ICD-10-CM | POA: Diagnosis not present

## 2021-08-26 DIAGNOSIS — R739 Hyperglycemia, unspecified: Secondary | ICD-10-CM | POA: Diagnosis not present

## 2021-08-26 DIAGNOSIS — E782 Mixed hyperlipidemia: Secondary | ICD-10-CM | POA: Diagnosis not present

## 2021-08-26 DIAGNOSIS — E039 Hypothyroidism, unspecified: Secondary | ICD-10-CM | POA: Diagnosis not present

## 2021-09-02 ENCOUNTER — Ambulatory Visit: Payer: PPO | Admitting: Dermatology

## 2021-09-02 DIAGNOSIS — R7989 Other specified abnormal findings of blood chemistry: Secondary | ICD-10-CM | POA: Diagnosis not present

## 2021-09-02 DIAGNOSIS — E782 Mixed hyperlipidemia: Secondary | ICD-10-CM | POA: Diagnosis not present

## 2021-09-02 DIAGNOSIS — E039 Hypothyroidism, unspecified: Secondary | ICD-10-CM | POA: Diagnosis not present

## 2021-09-02 DIAGNOSIS — I10 Essential (primary) hypertension: Secondary | ICD-10-CM | POA: Diagnosis not present

## 2021-09-02 DIAGNOSIS — Z0000 Encounter for general adult medical examination without abnormal findings: Secondary | ICD-10-CM | POA: Diagnosis not present

## 2021-09-02 DIAGNOSIS — R739 Hyperglycemia, unspecified: Secondary | ICD-10-CM | POA: Diagnosis not present

## 2021-09-02 DIAGNOSIS — M2550 Pain in unspecified joint: Secondary | ICD-10-CM | POA: Diagnosis not present

## 2021-09-09 ENCOUNTER — Ambulatory Visit: Payer: Self-pay | Admitting: General Surgery

## 2021-09-17 ENCOUNTER — Encounter
Admission: RE | Admit: 2021-09-17 | Discharge: 2021-09-17 | Disposition: A | Discharge status: Home / Self Care | Payer: PPO | Source: Ambulatory Visit | Attending: General Surgery | Admitting: General Surgery

## 2021-09-17 DIAGNOSIS — E039 Hypothyroidism, unspecified: Secondary | ICD-10-CM

## 2021-09-17 DIAGNOSIS — M5412 Radiculopathy, cervical region: Secondary | ICD-10-CM | POA: Diagnosis not present

## 2021-09-17 DIAGNOSIS — M4802 Spinal stenosis, cervical region: Secondary | ICD-10-CM | POA: Diagnosis not present

## 2021-09-17 NOTE — Progress Notes (Signed)
  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 Patient reported to another health system that "penicillin just doesn't work"   Screened as appropriate for cephalosporin use during medication reconciliation No immediate angioedema, dysphagia, SOB, anaphylaxis symptoms. No severe rash involving mucous membranes or skin necrosis. No hospital admissions related to side effects of PCN/cephalosporin use.  No documented reaction to PCN or cephalosporin in the last 10 years.  Request:  As an evidence based approach to reducing the rate of incidence for post-operative SSI and the development of MDROs, could an agent that allows for narrower antimicrobial coverage for preoperative prophylaxis in this patient's upcoming surgical course be considered?   Currently ordered preoperative prophylactic ABX: vancomycin.   Specifically requesting change to cephalosporin (CEFAZOLIN or CEFOXITIN).  Drug of choice for many procedures; it is the most widely studied antimicrobial agent with proven efficacy for antimicrobial prophylaxis.   Desirable duration of action, spectrum of activity against organisms commonly encountered in surgery, and it has an excellent safety profile and low cost.   Active  against streptococci, methicillin-susceptible staphylococci, and many gram-negative organisms.  Please communicate decision with me and I will change the orders in Epic as per your direction.    Honor Loh, MSN, APRN, FNP-C, CEN [facility]  Peri-operative Services Nurse Practitioner FAX: [phone] 09/17/21 2:28 PM

## 2021-09-17 NOTE — Patient Instructions (Addendum)
Your procedure is scheduled on: 09/24/2021 Report to the Registration Desk on the 1st floor of the [facility]. To find out your arrival time, please call [phone] between 1PM - 3PM on: 5/26/ 2023 If your arrival time is 6:00 am, do not arrive prior to that time as the [facility] entrance doors do not open until 6:00 am.  REMEMBER: Instructions that are not followed completely may result in serious medical risk, up to and including death; or upon the discretion of your surgeon and anesthesiologist your surgery may need to be rescheduled.  Do not eat food after midnight the night before surgery.  No gum chewing, lozengers or hard candies.    TAKE THESE MEDICATIONS THE MORNING OF SURGERY WITH A SIP OF WATER: levothyroxine (SYNTHROID)   Follow recommendations from Cardiologist, Pulmonologist or PCP regarding stopping Aspirin.  One week prior to surgery: Stop Anti-inflammatories (NSAIDS) such as Advil, Aleve, Ibuprofen, Motrin, Naproxen, Naprosyn and Aspirin based products such as Excedrin, Goodys Powder, BC Powder and meloxicam  Stop ANY OVER THE COUNTER supplements until after surgery like Multiple Vitamins-Minerals , BIOTIN, cholecalciferol (VITAMIN D), B Complex Vitamins You may however, continue to take Tylenol if needed for pain up until the day of surgery.  No Alcohol for 24 hours before or after surgery.  No Smoking including e-cigarettes for 24 hours prior to surgery.  No chewable tobacco products for at least 6 hours prior to surgery.  No nicotine patches on the day of surgery.  Do not use any "recreational" drugs for at least a week prior to your surgery.  Please be advised that the combination of cocaine and anesthesia may have negative outcomes, up to and including death. If you test positive for cocaine, your surgery will be cancelled.  On the morning of surgery brush your teeth with toothpaste and water, you may rinse your mouth with mouthwash if you wish. Do not  swallow any toothpaste or mouthwash.  Use CHG Soap as directed on instruction sheet.  Do not wear jewelry, make-up, hairpins, clips or nail polish.  Do not wear lotions, powders, or perfumes.   Do not shave body from the neck down 48 hours prior to surgery just in case you cut yourself which could leave a site for infection.  Also, freshly shaved skin may become irritated if using the CHG soap.  Contact lenses, hearing aids and dentures may not be worn into surgery.  Do not bring valuables to the hospital. [facility] is not responsible for any missing/lost belongings or valuables.    Notify your doctor if there is any change in your medical condition (cold, fever, infection).  Wear comfortable clothing (specific to your surgery type) to the hospital.  After surgery, you can help prevent lung complications by doing breathing exercises.  Take deep breaths and cough every 1-2 hours. Your doctor may order a device called an Incentive Spirometer to help you take deep breaths. If you are being admitted to the hospital overnight, leave your suitcase in the car. After surgery it may be brought to your room.  If you are being discharged the day of surgery, you will not be allowed to drive home. You will need a responsible adult (18 years or older) to drive you home and stay with you that night.   If you are taking public transportation, you will need to have a responsible adult (18 years or older) with you. Please confirm with your physician that it is acceptable to use public transportation.  Please call the [facility] Dept. at [phone] if you have any questions about these instructions.  Surgery Visitation Policy:  Patients undergoing a surgery or procedure may have two family members or support persons with them as long as the person is not COVID-19 positive or experiencing its symptoms.

## 2021-09-18 ENCOUNTER — Encounter
Admission: RE | Admit: 2021-09-18 | Discharge: 2021-09-18 | Disposition: A | Discharge status: Home / Self Care | Payer: PPO | Source: Ambulatory Visit | Attending: General Surgery | Admitting: General Surgery

## 2021-09-18 DIAGNOSIS — Z01810 Encounter for preprocedural cardiovascular examination: Secondary | ICD-10-CM | POA: Diagnosis not present

## 2021-09-18 DIAGNOSIS — E039 Hypothyroidism, unspecified: Secondary | ICD-10-CM | POA: Diagnosis not present

## 2021-09-18 DIAGNOSIS — Z01818 Encounter for other preprocedural examination: Secondary | ICD-10-CM | POA: Diagnosis not present

## 2021-09-18 NOTE — Progress Notes (Signed)
  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 Following analysis of the risk versus benefits, the patient's primary attending surgeon advised that it would be acceptable to discontinue the ordered vancomycin and place an order for cefazolin 2 gm IV on call to the OR. Orders for this patient were amended by me following collaborative conversation with attending surgeon taking into consideration of risk versus benefits associated with the change in therapy.  Honor Loh, MSN, APRN, FNP-C, CEN [facility]  Peri-operative Services Nurse Practitioner Phone: [phone] 09/18/21 4:22 PM

## 2021-09-24 ENCOUNTER — Other Ambulatory Visit: Payer: Self-pay

## 2021-09-24 ENCOUNTER — Ambulatory Visit: Payer: PPO | Admitting: Urgent Care

## 2021-09-24 ENCOUNTER — Encounter
Admission: RE | Disposition: A | Discharge status: Home / Self Care | Payer: Self-pay | Source: Home / Self Care | Attending: General Surgery

## 2021-09-24 ENCOUNTER — Encounter: Payer: Self-pay | Admitting: General Surgery

## 2021-09-24 ENCOUNTER — Ambulatory Visit
Admission: RE | Admit: 2021-09-24 | Discharge: 2021-09-24 | Disposition: A | Discharge status: Home / Self Care | Payer: PPO | Attending: General Surgery | Admitting: General Surgery

## 2021-09-24 DIAGNOSIS — K601 Chronic anal fissure: Secondary | ICD-10-CM | POA: Insufficient documentation

## 2021-09-24 DIAGNOSIS — K644 Residual hemorrhoidal skin tags: Secondary | ICD-10-CM | POA: Diagnosis not present

## 2021-09-24 DIAGNOSIS — K648 Other hemorrhoids: Secondary | ICD-10-CM | POA: Diagnosis not present

## 2021-09-24 HISTORY — PX: BOTOX INJECTION: SHX5754

## 2021-09-24 HISTORY — PX: HEMORRHOID SURGERY: SHX153

## 2021-09-24 SURGERY — BOTOX INJECTION
Anesthesia: General | Site: Rectum

## 2021-09-24 MED ORDER — LACTATED RINGERS IV SOLN: INTRAVENOUS | Status: DC

## 2021-09-24 MED ORDER — DEXAMETHASONE SODIUM PHOSPHATE 10 MG/ML IJ SOLN
INTRAMUSCULAR | Status: DC | PRN
  Administered 2021-09-24: 10 mg via INTRAVENOUS

## 2021-09-24 MED ORDER — CHLORHEXIDINE GLUCONATE 0.12 % MT SOLN
OROMUCOSAL | Status: AC
  Administered 2021-09-24: 15 mL via OROMUCOSAL
  Filled 2021-09-24: qty 15

## 2021-09-24 MED ORDER — FENTANYL CITRATE (PF) 100 MCG/2ML IJ SOLN
INTRAMUSCULAR | Status: AC
  Filled 2021-09-24: qty 2

## 2021-09-24 MED ORDER — BUPIVACAINE LIPOSOME 1.3 % IJ SUSP
INTRAMUSCULAR | Status: AC
  Filled 2021-09-24: qty 20

## 2021-09-24 MED ORDER — CEFAZOLIN SODIUM-DEXTROSE 2-4 GM/100ML-% IV SOLN
2.0000 g | Freq: Once | INTRAVENOUS | Status: AC
  Administered 2021-09-24: 2 g via INTRAVENOUS

## 2021-09-24 MED ORDER — ACETAMINOPHEN 10 MG/ML IV SOLN
INTRAVENOUS | Status: AC
  Filled 2021-09-24: qty 100

## 2021-09-24 MED ORDER — ACETAMINOPHEN 10 MG/ML IV SOLN
INTRAVENOUS | Status: DC | PRN
  Administered 2021-09-24: 1000 mg via INTRAVENOUS

## 2021-09-24 MED ORDER — ONDANSETRON HCL 4 MG/2ML IJ SOLN: 4.0000 mg | Freq: Once | INTRAMUSCULAR | Status: DC | PRN

## 2021-09-24 MED ORDER — OXYCODONE HCL 5 MG PO TABS: 5.0000 mg | ORAL_TABLET | Freq: Once | ORAL | Status: DC | PRN

## 2021-09-24 MED ORDER — BUPIVACAINE-EPINEPHRINE (PF) 0.5% -1:200000 IJ SOLN
INTRAMUSCULAR | Status: AC
  Filled 2021-09-24: qty 30

## 2021-09-24 MED ORDER — PROPOFOL 10 MG/ML IV BOLUS
INTRAVENOUS | Status: DC | PRN
  Administered 2021-09-24: 30 mg via INTRAVENOUS
  Administered 2021-09-24: 130 mg via INTRAVENOUS
  Administered 2021-09-24: 40 mg via INTRAVENOUS

## 2021-09-24 MED ORDER — HYDROCODONE-ACETAMINOPHEN 5-325 MG PO TABS: 1.0000 | ORAL_TABLET | ORAL | 0 refills | Status: DC | PRN

## 2021-09-24 MED ORDER — MIDAZOLAM HCL 2 MG/2ML IJ SOLN
INTRAMUSCULAR | Status: DC | PRN
  Administered 2021-09-24 (×2): .5 mg via INTRAVENOUS

## 2021-09-24 MED ORDER — PROPOFOL 10 MG/ML IV BOLUS
INTRAVENOUS | Status: AC
  Filled 2021-09-24: qty 20

## 2021-09-24 MED ORDER — FAMOTIDINE 20 MG PO TABS: 20.0000 mg | ORAL_TABLET | Freq: Once | ORAL | Status: AC

## 2021-09-24 MED ORDER — ORAL CARE MOUTH RINSE: 15.0000 mL | Freq: Once | OROMUCOSAL | Status: AC

## 2021-09-24 MED ORDER — PHENYLEPHRINE 80 MCG/ML (10ML) SYRINGE FOR IV PUSH (FOR BLOOD PRESSURE SUPPORT)
PREFILLED_SYRINGE | INTRAVENOUS | Status: AC
  Filled 2021-09-24: qty 10

## 2021-09-24 MED ORDER — ONDANSETRON HCL 4 MG/2ML IJ SOLN
INTRAMUSCULAR | Status: AC
  Filled 2021-09-24: qty 2

## 2021-09-24 MED ORDER — DEXAMETHASONE SODIUM PHOSPHATE 10 MG/ML IJ SOLN
INTRAMUSCULAR | Status: AC
  Filled 2021-09-24: qty 1

## 2021-09-24 MED ORDER — LIDOCAINE HCL (PF) 2 % IJ SOLN
INTRAMUSCULAR | Status: AC
  Filled 2021-09-24: qty 5

## 2021-09-24 MED ORDER — ACETAMINOPHEN 10 MG/ML IV SOLN: 1000.0000 mg | Freq: Once | INTRAVENOUS | Status: DC | PRN

## 2021-09-24 MED ORDER — CEFAZOLIN SODIUM-DEXTROSE 2-4 GM/100ML-% IV SOLN
INTRAVENOUS | Status: AC
  Filled 2021-09-24: qty 100

## 2021-09-24 MED ORDER — MIDAZOLAM HCL 2 MG/2ML IJ SOLN
INTRAMUSCULAR | Status: AC
  Filled 2021-09-24: qty 2

## 2021-09-24 MED ORDER — ONDANSETRON HCL 4 MG/2ML IJ SOLN
INTRAMUSCULAR | Status: DC | PRN
  Administered 2021-09-24: 4 mg via INTRAVENOUS

## 2021-09-24 MED ORDER — FENTANYL CITRATE (PF) 100 MCG/2ML IJ SOLN
INTRAMUSCULAR | Status: DC | PRN
  Administered 2021-09-24: 50 ug via INTRAVENOUS
  Administered 2021-09-24 (×2): 25 ug via INTRAVENOUS

## 2021-09-24 MED ORDER — FENTANYL CITRATE (PF) 100 MCG/2ML IJ SOLN: 25.0000 ug | INTRAMUSCULAR | Status: DC | PRN

## 2021-09-24 MED ORDER — BUPIVACAINE LIPOSOME 1.3 % IJ SUSP
INTRAMUSCULAR | Status: DC | PRN
  Administered 2021-09-24: 20 mL

## 2021-09-24 MED ORDER — ACETAMINOPHEN-CODEINE 300-30 MG PO TABS: 1.0000 | ORAL_TABLET | ORAL | 0 refills | Status: DC | PRN

## 2021-09-24 MED ORDER — CHLORHEXIDINE GLUCONATE 0.12 % MT SOLN: 15.0000 mL | Freq: Once | OROMUCOSAL | Status: AC

## 2021-09-24 MED ORDER — LIDOCAINE HCL (CARDIAC) PF 100 MG/5ML IV SOSY
PREFILLED_SYRINGE | INTRAVENOUS | Status: DC | PRN
  Administered 2021-09-24: 60 mg via INTRAVENOUS

## 2021-09-24 MED ORDER — OXYCODONE HCL 5 MG/5ML PO SOLN: 5.0000 mg | Freq: Once | ORAL | Status: DC | PRN

## 2021-09-24 MED ORDER — FAMOTIDINE 20 MG PO TABS
ORAL_TABLET | ORAL | Status: AC
  Administered 2021-09-24: 20 mg via ORAL
  Filled 2021-09-24: qty 1

## 2021-09-24 MED ORDER — ONABOTULINUMTOXINA 100 UNITS IJ SOLR
INTRAMUSCULAR | Status: DC | PRN
  Administered 2021-09-24: 100 [IU] via INTRAMUSCULAR

## 2021-09-24 SURGICAL SUPPLY — 40 items
BLADE SURG 15 STRL LF DISP TIS (BLADE) ×1 IMPLANT
BLADE SURG 15 STRL SS (BLADE) ×1
COVER LIGHT HANDLE STERIS (MISCELLANEOUS) ×1 IMPLANT
DRAPE LAPAROTOMY 100X77 ABD (DRAPES) ×2 IMPLANT
DRAPE LEGGINS SURG 28X43 STRL (DRAPES) ×2 IMPLANT
DRAPE UNDER BUTTOCK W/FLU (DRAPES) ×2 IMPLANT
ELECT REM PT RETURN 9FT ADLT (ELECTROSURGICAL) ×2
ELECTRODE REM PT RTRN 9FT ADLT (ELECTROSURGICAL) ×1 IMPLANT
GAUZE 4X4 16PLY ~~LOC~~+RFID DBL (SPONGE) ×2 IMPLANT
GAUZE SPONGE 4X4 12PLY STRL (GAUZE/BANDAGES/DRESSINGS) ×2 IMPLANT
GLOVE BIO SURGEON STRL SZ 6.5 (GLOVE) ×2 IMPLANT
GLOVE BIOGEL PI IND STRL 6.5 (GLOVE) ×1 IMPLANT
GLOVE BIOGEL PI INDICATOR 6.5 (GLOVE) ×1
GOWN STRL REUS W/ TWL LRG LVL3 (GOWN DISPOSABLE) ×2 IMPLANT
GOWN STRL REUS W/TWL LRG LVL3 (GOWN DISPOSABLE) ×2
HEMOSTAT SURGICEL 2X3 (HEMOSTASIS) IMPLANT
KIT TURNOVER CYSTO (KITS) ×2 IMPLANT
LABEL OR SOLS (LABEL) ×2 IMPLANT
MANIFOLD NEPTUNE II (INSTRUMENTS) ×2 IMPLANT
NEEDLE HYPO 22GX1.5 SAFETY (NEEDLE) ×2 IMPLANT
NS IRRIG 500ML POUR BTL (IV SOLUTION) ×2 IMPLANT
PACK BASIN MINOR ARMC (MISCELLANEOUS) ×2 IMPLANT
PAD ABD DERMACEA PRESS 5X9 (GAUZE/BANDAGES/DRESSINGS) ×2 IMPLANT
PAD PREP 24X41 OB/GYN DISP (PERSONAL CARE ITEMS) ×2 IMPLANT
PANTS MESH DISP 2XL (UNDERPADS AND DIAPERS) ×1 IMPLANT
PANTS MESH DISPOSABLE 2XL (UNDERPADS AND DIAPERS) ×1
SHEARS HARMONIC 9CM CVD (BLADE) IMPLANT
SOL PREP PVP 2OZ (MISCELLANEOUS) ×2
SOLUTION PREP PVP 2OZ (MISCELLANEOUS) ×1 IMPLANT
STAPLER PROXIMATE HCS (STAPLE) IMPLANT
SURGILUBE 2OZ TUBE FLIPTOP (MISCELLANEOUS) ×2 IMPLANT
SUT ETHILON 3-0 FS-10 30 BLK (SUTURE)
SUT MNCRL 4-0 (SUTURE) ×1
SUT MNCRL 4-0 27XMFL (SUTURE) ×1
SUT VIC AB 2-0 SH 27 (SUTURE) ×1
SUT VIC AB 2-0 SH 27XBRD (SUTURE) ×1 IMPLANT
SUTURE EHLN 3-0 FS-10 30 BLK (SUTURE) IMPLANT
SUTURE MNCRL 4-0 27XMF (SUTURE) IMPLANT
SYR 10ML LL (SYRINGE) ×2 IMPLANT
WATER STERILE IRR 500ML POUR (IV SOLUTION) ×2 IMPLANT

## 2021-09-24 NOTE — Transfer of Care (Signed)
Immediate Anesthesia Transfer of Care Note  Patient: Samantha Booker  Procedure(s) Performed: BOTOX INJECTION (Rectum) HEMORRHOIDECTOMY (Rectum)  Patient Location: PACU  Anesthesia Type:General  Level of Consciousness: drowsy  Airway & Oxygen Therapy: Patient Spontanous Breathing and Patient connected to face mask oxygen  Post-op Assessment: Report given to RN and Post -op Vital signs reviewed and stable  Post vital signs: Reviewed and stable  Last Vitals:  Vitals Value Taken Time  BP    Temp    Pulse 62 09/24/21 1149  Resp 16 09/24/21 1149  SpO2 99 % 09/24/21 1149  Vitals shown include unvalidated device data.  Last Pain:  Vitals:   09/24/21 1020  TempSrc: Oral  PainSc: 0-No pain         Complications: No notable events documented.

## 2021-09-24 NOTE — Anesthesia Procedure Notes (Signed)
Procedure Name: LMA Insertion Date/Time: 09/24/2021 10:51 AM Performed by: Loletha Grayer, CRNA Pre-anesthesia Checklist: Patient identified, Patient being monitored, Timeout performed, Emergency Drugs available and Suction available Patient Re-evaluated:Patient Re-evaluated prior to induction Oxygen Delivery Method: Circle system utilized Preoxygenation: Pre-oxygenation with 100% oxygen Induction Type: IV induction Ventilation: Mask ventilation without difficulty LMA: LMA inserted LMA Size: 3.5 Number of attempts: 2 Placement Confirmation: positive ETCO2 and breath sounds checked- equal and bilateral Tube secured with: Tape Dental Injury: Teeth and Oropharynx as per pre-operative assessment  Comments: LMA #4 placed x1-poor seal. Replaced with 3.5 AirQ LMA. Atraumatic placement.

## 2021-09-24 NOTE — Anesthesia Preprocedure Evaluation (Signed)
Anesthesia Evaluation  Patient identified by MRN, date of birth, ID band Patient awake    Reviewed: Allergy & Precautions, NPO status , Patient's Chart, lab work & pertinent test results  History of Anesthesia Complications Negative for: history of anesthetic complications  Airway Mallampati: I  TM Distance: >3 FB Neck ROM: Full    Dental no notable dental hx. (+) Teeth Intact   Pulmonary neg pulmonary ROS, neg sleep apnea, neg COPD, Patient abstained from smoking.Not current smoker,    Pulmonary exam normal breath sounds clear to auscultation       Cardiovascular Exercise Tolerance: Good METS(-) hypertension(-) CAD and (-) Past MI negative cardio ROS  (-) dysrhythmias  Rhythm:Regular Rate:Normal - Systolic murmurs    Neuro/Psych negative neurological ROS  negative psych ROS   GI/Hepatic neg GERD  ,(+)     (-) substance abuse  , Hepatitis -, B  Endo/Other  neg diabetesHypothyroidism   Renal/GU negative Renal ROS     Musculoskeletal  (+) Arthritis ,   Abdominal   Peds  Hematology   Anesthesia Other Findings Past Medical History: 06/25/2016: Atherosclerosis of aorta [facility])     Comment:  Imaging Feb 2018 No date: Chicken pox No date: DDD (degenerative disc disease), cervical No date: DDD (degenerative disc disease), cervical No date: Foliclar lymph grade III, unsp, nodes of head, face, 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  ([place]) 1972: Hepatitis     Comment:  Hep B No date: History of hepatitis B No date: History of kidney stones     Comment:  in past No date: Hyperlipidemia No date: Hypothyroidism No date: Jaundice No date: Thyroid disease  Reproductive/Obstetrics                             Anesthesia Physical Anesthesia Plan  ASA: 2  Anesthesia Plan: General   Post-op Pain Management: Ofirmev IV (intra-op)* and Toradol IV (intra-op)*   Induction: Intravenous  PONV Risk Score and Plan: 3 and  Ondansetron, Dexamethasone and Midazolam  Airway Management Planned: LMA  Additional Equipment: None  Intra-op Plan:   Post-operative Plan: Extubation in OR  Informed Consent: I have reviewed the patients History and Physical, chart, labs and discussed the procedure including the risks, benefits and alternatives for the proposed anesthesia with the patient or authorized representative who has indicated his/her understanding and acceptance.     Dental advisory given  Plan Discussed with: CRNA and Surgeon  Anesthesia Plan Comments: (Discussed risks of anesthesia with patient, including PONV, sore throat, lip/dental/eye damage. Rare risks discussed as well, such as cardiorespiratory and neurological sequelae, and allergic reactions. Discussed the role of CRNA in patient's perioperative care. Patient understands.)        Anesthesia Quick Evaluation

## 2021-09-24 NOTE — Anesthesia Postprocedure Evaluation (Signed)
Anesthesia Post Note  Patient: Samantha Booker  Procedure(s) Performed: BOTOX INJECTION (Rectum) HEMORRHOIDECTOMY (Rectum)  Patient location during evaluation: PACU Anesthesia Type: General Level of consciousness: awake and alert Pain management: pain level controlled Vital Signs Assessment: post-procedure vital signs reviewed and stable Respiratory status: spontaneous breathing, nonlabored ventilation, respiratory function stable and patient connected to nasal cannula oxygen Cardiovascular status: blood pressure returned to baseline and stable Postop Assessment: no apparent nausea or vomiting Anesthetic complications: no   No notable events documented.   Last Vitals:  Vitals:   09/24/21 1230 09/24/21 1234  BP: 125/68 (!) 143/70  Pulse: (!) 59 69  Resp: 14 18  Temp: (!) 36.3 C (!) 36.2 C  SpO2: 95% 98%    Last Pain:  Vitals:   09/24/21 1234  TempSrc: Temporal  PainSc: 0-No pain                 Arita Miss

## 2021-09-24 NOTE — Discharge Instructions (Addendum)
  Diet: Resume home heart healthy regular diet.   Activity: Increase activity as tolerated. Light activity and walking are encouraged. Do not drive or drink alcohol if taking narcotic pain medications.  Wound care: May shower with soapy water and pat dry (do not rub incisions), but no baths or submerging incision underwater until follow-up. (no swimming)   Medications: Resume all home medications. For mild to moderate pain: acetaminophen (Tylenol) or ibuprofen (if no kidney disease). Combining Tylenol with alcohol can substantially increase your risk of causing liver disease. Narcotic pain medications, if prescribed, can be used for severe pain, though may cause nausea, constipation, and drowsiness. Do not combine Tylenol and Norco within a 6 hour period as Norco contains Tylenol. If you do not need the narcotic pain medication, you do not need to fill the prescription.  Call office ([phone]) at any time if any questions, worsening pain, fevers/chills, bleeding, drainage from incision site, or other concerns.   AMBULATORY SURGERY  DISCHARGE INSTRUCTIONS   The drugs that you were given will stay in your system until tomorrow so for the next 24 hours you should not:  Drive an automobile Make any legal decisions Drink any alcoholic beverage   You may resume regular meals tomorrow.  Today it is better to start with liquids and gradually work up to solid foods.  You may eat anything you prefer, but it is better to start with liquids, then soup and crackers, and gradually work up to solid foods.   Please notify your doctor immediately if you have any unusual bleeding, trouble breathing, redness and pain at the surgery site, drainage, fever, or pain not relieved by medication.    Additional Instructions:        Please contact your physician with any problems or Same Day Surgery at [phone], Monday through Friday 6 am to 4 pm, or [facility] [place] Main number at  [phone]. 

## 2021-09-24 NOTE — H&P (Signed)
History of Present Illness Samantha Booker is a 72 y.o. female with external hemorrhoids and chronic anal fissure.  Fissure not completely resolved but pain improved. Still having issues with external hemorrhoid.   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 ([facility])    Hepatitis 1972   Hep B   History of hepatitis B    History of kidney stones    in past   Hyperlipidemia    Hypothyroidism    Jaundice    Thyroid disease        Past Surgical History:  Procedure Laterality Date   ABDOMINAL HYSTERECTOMY  1980   precancerous cells   APPENDECTOMY  1979   CATARACT EXTRACTION Bilateral 2019   CHOLECYSTECTOMY  2011   COLONOSCOPY WITH PROPOFOL N/A 05/20/2018   Procedure: COLONOSCOPY WITH PROPOFOL;  Surgeon: Lin Landsman, MD;  Location: [facility] END[facility];  Service: Gastroenterology;  Laterality: N/A;   ESOPHAGOGASTRODUODENOSCOPY (EGD) WITH PROPOFOL N/A 07/21/2019   Procedure: ESOPHAGOGASTRODUODENOSCOPY (EGD) WITH PROPOFOL;  Surgeon: Lin Landsman, MD;  Location: [facility];  Service: Endoscopy;  Laterality: N/A;   EYE SURGERY Left 10/05/2017   INGUINAL HERNIA REPAIR Left 12/15/2018   Procedure: LAPAROSCOPIC LEFT INGUINAL HERNIA VS OPEN;  Surgeon: Herbert Pun, MD;  Location: [facility] ORS;  Service: General;  Laterality: Left;   LAPAROSCOPIC UNILATERAL SALPINGO OOPHERECTOMY Right 12/15/2018   Procedure: LAPAROSCOPIC UNILATERAL OOPHORECTOMY;  Surgeon: Ward, Honor Loh, MD;  Location: [facility] ORS;  Service: Gynecology;  Laterality: Right;   ROOT CANAL     suburethral sling  2012   Dr. Audie Box    Allergies  Allergen Reactions   Penicillins Other (See Comments)    Ineffective for patient Did it involve swelling of the  face/tongue/throat, SOB, or low BP? No Did it involve sudden or severe rash/hives, skin peeling, or any reaction on the inside of your mouth or nose? No Did you need to seek medical attention at a hospital or doctor's office? No When did it last happen?NEVER EXPERIENCED ANY REACTION FROM PENICILLIN     If all above answers are "NO", may proceed with cephalosporin use.     Current Facility-Administered Medications  Medication Dose Route Frequency Provider Last Rate Last Admin   ceFAZolin (ANCEF) 2-4 GM/100ML-% IVPB            ceFAZolin (ANCEF) IVPB 2g/100 mL premix  2 g Intravenous Once Karen Kitchens, NP       lactated ringers infusion   Intravenous Continuous Darrin Nipper, MD 10 mL/hr at 09/24/21 1025 New Bag at 09/24/21 1025    Family History Family History  Problem Relation Age of Onset   Diabetes Mother    Heart disease Mother        CABG 2007   Hyperlipidemia Mother    COPD Mother    Hyperlipidemia Father    Heart disease Father    Cancer Maternal Grandmother        breast   Cancer Maternal Aunt        breast   Parkinson's disease Brother    Cancer Cousin        brain   Cancer Cousin        melanoma  Social History Social History   Tobacco Use   Smoking status: Former    Packs/day: 2.00    Years: 20.00    Pack years: 40.00    Types: Cigarettes    Quit date: 12/09/1991    Years since quitting: 29.8   Smokeless tobacco: Never  Vaping Use   Vaping Use: Never used  Substance Use Topics   Alcohol use: Yes    Alcohol/week: 2.0 standard drinks    Types: 2 Glasses of wine per week    Comment: socially   Drug use: No       ROS Full ROS of systems performed and is otherwise negative there than what is stated in the HPI  Physical Exam Blood pressure (!) 145/85, pulse 69, temperature (!) 97.3 F (36.3 C), temperature source Oral, resp. rate 16, height '5\' 2"'$  (1.575 m), weight 56.7 kg, SpO2 100 %.  CONSTITUTIONAL: alert, oriented x  EYES: Pupils equal,  round, and reactive to light, Sclera non-icteric. EARS, NOSE, MOUTH AND THROAT: The oropharynx is clear. Oral mucosa is pink and moist. Hearing is intact to voice.  NECK: Trachea is midline, and there is no jugular venous distension. Thyroid is without palpable abnormalities. LYMPH NODES:  Lymph nodes in the neck are not enlarged. RESPIRATORY:  Lungs are clear, and breath sounds are equal bilaterally. Normal respiratory effort without pathologic use of accessory muscles. CARDIOVASCULAR: Heart is regular without murmurs, gallops, or rubs. GI: The abdomen is soft, nontender, and nondistended. There were no palpable masses. There was no hepatosplenomegaly. There were normal bowel sounds. GU: external hemorrhoids with fissure MUSCULOSKELETAL:  Normal muscle strength and tone in all four extremities.    SKIN: Skin turgor is normal. There are no pathologic skin lesions.  NEUROLOGIC:  Motor and sensation is grossly normal.  Cranial nerves are grossly intact. PSYCH:  Alert and oriented to person, place and time. Affect is normal.  Data Reviewed  I have personally reviewed the patient's imaging and medical records.    Assessment    External hemorrhoids Chronic anal fissures  Plan    Will proceed with external hemorrhoidectomy and chemodenervation of internal anal sphincter.    Herbert Pun, MD  Herbert Pun 09/24/2021, 10:31 AM

## 2021-09-24 NOTE — Op Note (Signed)
Preoperative diagnosis: External hemorrhoids.   Postoperative diagnosis: External hemorrhoids.  Procedure: Anoscopy, external hemorrhoidectomy.                     Chemodenervation of internal anal sphincter       Surgeon: Dr. Windell Moment  Anesthesia: LMA  Wound classification: Clean Contaminated  Indications: Patient is a 71 y.o. female was found to have symptomatic external hemorrhoids refractory to medical managemen.  She also had chronic anal fissure that was not able to completely heal with nifedipine cream.  Findings: 1.  Enlarged right posterior and left lateral external hemorrhoids 2. Internal and external anal sphincter identified and preserved 3. Adequate hemostasis  Description of procedure: The patient was brought to the operating room. Patient was placed in the prone lithotomy position. A time-out was completed verifying correct patient, procedure, site, positioning, and implant(s) and/or special equipment prior to beginning this procedure. The buttocks were taped apart.  The perineum was prepped and draped in standard sterile fashion. Local anesthetic was injected as a perianal block with Exparel. An anoscope was introduced and the three hemorrhoidal pedicles were identified.  Both of the next oxygen was injected in all 4 quadrants of the internal and sphincter. And then the right posterior and left lateral external hemorrhoids were excised.  Each hemorrhoid was excised doing a wide excision at the base of the external hemorrhoid with #15 blade.  With the use of cautery the external hemorrhoid was completely excised.  The wound was closed with 3-0 Vicryl and 4 Monocryl.  Both hemorrhoids were removed using the same technique.  A gauze pad was tucked between the gluteal folds.  The patient tolerated the procedure well and was taken to the postanesthesia care unit in stable condition.   Specimen: hemorrhoids  Complications: none  EBL: 5 mL

## 2021-09-25 ENCOUNTER — Encounter: Payer: Self-pay | Admitting: General Surgery

## 2021-09-25 LAB — SURGICAL PATHOLOGY

## 2021-10-02 ENCOUNTER — Encounter: Payer: Self-pay | Admitting: General Surgery

## 2021-10-09 DIAGNOSIS — M159 Polyosteoarthritis, unspecified: Secondary | ICD-10-CM | POA: Diagnosis not present

## 2021-10-30 ENCOUNTER — Ambulatory Visit: Payer: PPO | Admitting: Dermatology

## 2021-10-30 DIAGNOSIS — D2371 Other benign neoplasm of skin of right lower limb, including hip: Secondary | ICD-10-CM

## 2021-10-30 DIAGNOSIS — Z85828 Personal history of other malignant neoplasm of skin: Secondary | ICD-10-CM

## 2021-10-30 DIAGNOSIS — D229 Melanocytic nevi, unspecified: Secondary | ICD-10-CM | POA: Diagnosis not present

## 2021-10-30 DIAGNOSIS — L570 Actinic keratosis: Secondary | ICD-10-CM

## 2021-10-30 DIAGNOSIS — L719 Rosacea, unspecified: Secondary | ICD-10-CM

## 2021-10-30 DIAGNOSIS — C44712 Basal cell carcinoma of skin of right lower limb, including hip: Secondary | ICD-10-CM

## 2021-10-30 DIAGNOSIS — L578 Other skin changes due to chronic exposure to nonionizing radiation: Secondary | ICD-10-CM | POA: Diagnosis not present

## 2021-10-30 DIAGNOSIS — D485 Neoplasm of uncertain behavior of skin: Secondary | ICD-10-CM | POA: Diagnosis not present

## 2021-10-30 DIAGNOSIS — Z1283 Encounter for screening for malignant neoplasm of skin: Secondary | ICD-10-CM

## 2021-10-30 DIAGNOSIS — D1800 Hemangioma unspecified site: Secondary | ICD-10-CM

## 2021-10-30 DIAGNOSIS — D2372 Other benign neoplasm of skin of left lower limb, including hip: Secondary | ICD-10-CM

## 2021-10-30 DIAGNOSIS — L814 Other melanin hyperpigmentation: Secondary | ICD-10-CM | POA: Diagnosis not present

## 2021-10-30 DIAGNOSIS — D1801 Hemangioma of skin and subcutaneous tissue: Secondary | ICD-10-CM | POA: Diagnosis not present

## 2021-10-30 DIAGNOSIS — L821 Other seborrheic keratosis: Secondary | ICD-10-CM | POA: Diagnosis not present

## 2021-10-30 DIAGNOSIS — D489 Neoplasm of uncertain behavior, unspecified: Secondary | ICD-10-CM

## 2021-10-30 HISTORY — DX: Actinic keratosis: L57.0

## 2021-10-30 HISTORY — DX: Personal history of other malignant neoplasm of skin: Z85.828

## 2021-10-30 MED ORDER — MUPIROCIN 2 % EX OINT: 1.0000 | TOPICAL_OINTMENT | Freq: Two times a day (BID) | CUTANEOUS | 0 refills | Status: DC

## 2021-10-30 NOTE — Progress Notes (Signed)
   Follow-Up Visit   Subjective  Samantha Booker is a 71 y.o. female who presents for the following: Annual Exam (1 year tbse . Reports a spot at left forearm that looks like it is changing. ).  The patient presents for Total-Body Skin Exam (TBSE) for skin cancer screening and mole check.  The patient has spots, moles and lesions to be evaluated, some may be new or changing and the patient has concerns that these could be cancer.  The following portions of the chart were reviewed this encounter and updated as appropriate:  Tobacco  Allergies  Meds  Problems  Med Hx  Surg Hx  Fam Hx      Review of Systems: No other skin or systemic complaints except as noted in HPI or Assessment and Plan. Discussed the treatment option of BBL/laser.    Objective  Well appearing patient in no apparent distress; mood and affect are within normal limits.  A full examination was performed including scalp, head, eyes, ears, nose, lips, neck, chest, axillae, abdomen, back, buttocks, bilateral upper extremities, bilateral lower extremities, hands, feet, fingers, toes, fingernails, and toenails. All findings within normal limits unless otherwise noted below.  Head - Anterior (Face) Mid-face erythema  right nasal dorsum 0.5 cm red blanching papule      Right Forearm - Anterior 0.3 cm pink papule        mid chest 0.6 cm pink papule      Right Popliteal Fossa 0.4 cm flesh colored papule         Assessment & Plan  Lentigines - Scattered tan macules - Due to sun exposure - Benign-appearing, observe - Recommend daily broad spectrum sunscreen SPF 30+ to sun-exposed areas, reapply every 2 hours as needed. - Call for any changes  Seborrheic Keratoses At left forearm  - Stuck-on, waxy, tan-brown papules and/or plaques  - Benign-appearing - Discussed benign etiology and prognosis. - Observe - Call for any changes  Melanocytic Nevi - Tan-brown and/or pink-flesh-colored  symmetric macules and papules - Benign appearing on exam today - Observation - Call clinic for new or changing moles - Recommend daily use of broad spectrum spf 30+ sunscreen to sun-exposed areas.   Hemangiomas - Red papules - Discussed benign nature - Observe - Call for any changes  Dermatofibroma At left upper back and right pretibial  - Firm pink/brown papulenodule with dimple sign - Benign appearing - Call for any changes  Actinic Damage - Chronic condition, secondary to cumulative UV/sun exposure - diffuse scaly erythematous macules with underlying dyspigmentation - Recommend daily broad spectrum sunscreen SPF 30+ to sun-exposed areas, reapply every 2 hours as needed.  - Staying in the shade or wearing long sleeves, sun glasses (UVA+UVB protection) and wide brim hats (4-inch brim around the entire circumference of the hat) are also recommended for sun protection.  - Call for new or changing lesions.  Skin cancer screening performed today. Return in about 6 months (around 05/02/2022) for TBSE.  I, Ruthell Rummage, CMA, am acting as scribe for Forest Gleason, MD.  Documentation: I have reviewed the above documentation for accuracy and completeness, and I agree with the above.  Forest Gleason, MD

## 2021-10-30 NOTE — Patient Instructions (Addendum)
Electrodesiccation and Curettage ("Scrape and Burn") Wound Care Instructions  Leave the original bandage on for 24 hours if possible.  If the bandage becomes soaked or soiled before that time, it is OK to remove it and examine the wound.  A small amount of post-operative bleeding is normal.  If excessive bleeding occurs, remove the bandage, place gauze over the site and apply continuous pressure (no peeking) over the area for 30 minutes. If this does not work, please call our clinic as soon as possible or page your doctor if it is after hours.   Once a day, cleanse the wound with soap and water. It is fine to shower. If a thick crust develops you may use a Q-tip dipped into dilute hydrogen peroxide (mix 1:1 with water) to dissolve it.  Hydrogen peroxide can slow the healing process, so use it only as needed.    After washing, apply petroleum jelly (Vaseline) or an antibiotic ointment if your doctor prescribed one for you, followed by a bandage.    For best healing, the wound should be covered with a layer of ointment at all times. If you are not able to keep the area covered with a bandage to hold the ointment in place, this may mean re-applying the ointment several times a day.  Continue this wound care until the wound has healed and is no longer open. It may take several weeks for the wound to heal and close.  Itching and mild discomfort is normal during the healing process.  If you have any discomfort, you can take Tylenol (acetaminophen) or ibuprofen as directed on the bottle. (Please do not take these if you have an allergy to them or cannot take them for another reason).  Some redness, tenderness and white or yellow material in the wound is normal healing.  If the area becomes very sore and red, or develops a thick yellow-green material (pus), it may be infected; please notify us.    Wound healing continues for up to one year following surgery. It is not unusual to experience pain in the scar  from time to time during the interval.  If the pain becomes severe or the scar thickens, you should notify the office.    A slight amount of redness in a scar is expected for the first six months.  After six months, the redness will fade and the scar will soften and fade.  The color difference becomes less noticeable with time.  If there are any problems, return for a post-op surgery check at your earliest convenience.  To improve the appearance of the scar, you can use silicone scar gel, cream, or sheets (such as Mederma or Serica) every night for up to one year. These are available over the counter (without a prescription).  Please call our office at [phone] for any questions or concerns.  Biopsy Wound Care Instructions  Leave the original bandage on for 24 hours if possible.  If the bandage becomes soaked or soiled before that time, it is OK to remove it and examine the wound.  A small amount of post-operative bleeding is normal.  If excessive bleeding occurs, remove the bandage, place gauze over the site and apply continuous pressure (no peeking) over the area for 30 minutes. If this does not work, please call our clinic as soon as possible or page your doctor if it is after hours.   Once a day, cleanse the wound with soap and water. It is fine to shower. If   shower. If a thick crust develops you may use a Q-tip dipped into dilute hydrogen peroxide (mix 1:1 with water) to dissolve it.  Hydrogen peroxide can slow the healing process, so use it only as needed.    After washing, apply petroleum jelly (Vaseline) or an antibiotic ointment if your doctor prescribed one for you, followed by a bandage.    For best healing, the wound should be covered with a layer of ointment at all times. If you are not able to keep the area covered with a bandage to hold the ointment in place, this may mean re-applying the ointment several times a day.  Continue this wound care until the wound has healed and is no longer  open.   Itching and mild discomfort is normal during the healing process. However, if you develop pain or severe itching, please call our office.   If you have any discomfort, you can take Tylenol (acetaminophen) or ibuprofen as directed on the bottle. (Please do not take these if you have an allergy to them or cannot take them for another reason).  Some redness, tenderness and white or yellow material in the wound is normal healing.  If the area becomes very sore and red, or develops a thick yellow-green material (pus), it may be infected; please notify us.    If you have stitches, return to clinic as directed to have the stitches removed. You will continue wound care for 2-3 days after the stitches are removed.   Wound healing continues for up to one year following surgery. It is not unusual to experience pain in the scar from time to time during the interval.  If the pain becomes severe or the scar thickens, you should notify the office.    A slight amount of redness in a scar is expected for the first six months.  After six months, the redness will fade and the scar will soften and fade.  The color difference becomes less noticeable with time.  If there are any problems, return for a post-op surgery check at your earliest convenience.  To improve the appearance of the scar, you can use silicone scar gel, cream, or sheets (such as Mederma or Serica) every night for up to one year. These are available over the counter (without a prescription).  Please call our office at [phone] for any questions or concerns.   Seborrheic Keratosis  What causes seborrheic keratoses? Seborrheic keratoses are harmless, common skin growths that first appear during adult life.  As time goes by, more growths appear.  Some people may develop a large number of them.  Seborrheic keratoses appear on both covered and uncovered body parts.  They are not caused by sunlight.  The tendency to develop seborrheic  keratoses can be inherited.  They vary in color from skin-colored to gray, brown, or even black.  They can be either smooth or have a rough, warty surface.   Seborrheic keratoses are superficial and look as if they were stuck on the skin.  Under the microscope this type of keratosis looks like layers upon layers of skin.  That is why at times the top layer may seem to fall off, but the rest of the growth remains and re-grows.    Treatment Seborrheic keratoses do not need to be treated, but can easily be removed in the office.  Seborrheic keratoses often cause symptoms when they rub on clothing or jewelry.  Lesions can be in the way of shaving.  If they  become inflamed, they can cause itching, soreness, or burning.  Removal of a seborrheic keratosis can be accomplished by freezing, burning, or surgery. If any spot bleeds, scabs, or grows rapidly, please return to have it checked, as these can be an indication of a skin cancer.    Melanoma ABCDEs  Melanoma is the most dangerous type of skin cancer, and is the leading cause of death from skin disease.  You are more likely to develop melanoma if you: Have light-colored skin, light-colored eyes, or red or blond hair Spend a lot of time in the sun Tan regularly, either outdoors or in a tanning bed Have had blistering sunburns, especially during childhood Have a close family member who has had a melanoma Have atypical moles or large birthmarks  Early detection of melanoma is key since treatment is typically straightforward and cure rates are extremely high if we catch it early.   The first sign of melanoma is often a change in a mole or a new dark spot.  The ABCDE system is a way of remembering the signs of melanoma.  A for asymmetry:  The two halves do not match. B for border:  The edges of the growth are irregular. C for color:  A mixture of colors are present instead of an even brown color. D for diameter:  Melanomas are usually (but not  always) greater than 52m - the size of a pencil eraser. E for evolution:  The spot keeps changing in size, shape, and color.  Please check your skin once per month between visits. You can use a small mirror in front and a large mirror behind you to keep an eye on the back side or your body.   If you see any new or changing lesions before your next follow-up, please call to schedule a visit.  Please continue daily skin protection including broad spectrum sunscreen SPF 30+ to sun-exposed areas, reapplying every 2 hours as needed when you're outdoors.   Staying in the shade or wearing long sleeves, sun glasses (UVA+UVB protection) and wide brim hats (4-inch brim around the entire circumference of the hat) are also recommended for sun protection.    Due to recent changes in healthcare laws, you may see results of your pathology and/or laboratory studies on MyChart before the doctors have had a chance to review them. We understand that in some cases there may be results that are confusing or concerning to you. Please understand that not all results are received at the same time and often the doctors may need to interpret multiple results in order to provide you with the best plan of care or course of treatment. Therefore, we ask that you please give [place] business days to thoroughly review all your results before contacting the office for clarification. Should we see a critical lab result, you will be contacted sooner.   If You Need Anything After Your Visit  If you have any questions or concerns for your doctor, please call our main line at [phone] press option 4 to reach your doctor's medical assistant. If no one answers, please leave a voicemail as directed and we will return your call as soon as possible. Messages left after 4 pm will be answered the following business day.   You may also send [place] message via [place] We typically respond to MyChart messages within 1-2 business days.  For  prescription refills, please ask your pharmacy to contact our office. Our fax number is [phone]  If you have an urgent issue when the clinic is closed that cannot wait until the next business day, you can page your doctor at the number below.    Please note that while we do our best to be available for urgent issues outside of office hours, we are not available 24/7.   If you have an urgent issue and are unable to reach us, you may choose to seek medical care at your doctor's office, retail clinic, urgent care center, or emergency room.  If you have a medical emergency, please immediately call 911 or go to the emergency department.  Pager Numbers  - Dr. Kowalski: [phone]  - Dr. Moye: [phone]  - Dr. Stewart: [phone]  In the event of inclement weather, please call our main line at [phone] for an update on the status of any delays or closures.  Dermatology Medication Tips: Please keep the boxes that topical medications come in in order to help keep track of the instructions about where and how to use these. Pharmacies typically print the medication instructions only on the boxes and not directly on the medication tubes.   If your medication is too expensive, please contact our office at [phone] option 4 or send us a message through MyChart.   We are unable to tell what your co-pay for medications will be in advance as this is different depending on your insurance coverage. However, we may be able to find a substitute medication at lower cost or fill out paperwork to get insurance to cover a needed medication.   If a prior authorization is required to get your medication covered by your insurance company, please allow us 1-2 business days to complete this process.  Drug prices often vary depending on where the prescription is filled and some pharmacies may offer cheaper prices.  The website www.goodrx.com contains coupons for medications through  different pharmacies. The prices here do not account for what the cost may be with help from insurance (it may be cheaper with your insurance), but the website can give you the price if you did not use any insurance.  - You can print the associated coupon and take it with your prescription to the pharmacy.  - You may also stop by our office during regular business hours and pick up a GoodRx coupon card.  - If you need your prescription sent electronically to a different pharmacy, notify our office through [facility] MyChart or by phone at [phone] option 4.     Si Usted Necesita Algo Despus de Su Visita  Tambin puede enviarnos un mensaje a travs de MyChart. Por lo general respondemos a los mensajes de MyChart en el transcurso de 1 a 2 das hbiles.  Para renovar recetas, por favor pida a su farmacia que se ponga en contacto con nuestra oficina. Nuestro nmero de fax es el [phone].  Si tiene un asunto urgente cuando la clnica est cerrada y que no puede esperar hasta el siguiente da hbil, puede llamar/localizar a su doctor(a) al nmero que aparece a continuacin.   Por favor, tenga en cuenta que aunque hacemos todo lo posible para estar disponibles para asuntos urgentes fuera del horario de oficina, no estamos disponibles las 24 horas del da, los 7 das de la semana.   Si tiene un problema urgente y no puede comunicarse con nosotros, puede optar por buscar atencin mdica  en el consultorio de su doctor(a), en una clnica privada, en un centro de atencin urgente o en una   sala de emergencias.  Si tiene una emergencia mdica, por favor llame inmediatamente al 911 o vaya a la sala de emergencias.  Nmeros de bper  - Dr. Kowalski: [phone]  - Dra. Moye: [phone]  - Dra. Stewart: [phone]  En caso de inclemencias del tiempo, por favor llame a nuestra lnea principal al [phone] para una actualizacin sobre el estado de cualquier retraso o cierre.  Consejos  para la medicacin en dermatologa: Por favor, guarde las cajas en las que vienen los medicamentos de uso tpico para ayudarle a seguir las instrucciones sobre dnde y cmo usarlos. Las farmacias generalmente imprimen las instrucciones del medicamento slo en las cajas y no directamente en los tubos del medicamento.   Si su medicamento es muy caro, por favor, pngase en contacto con nuestra oficina llamando al [phone] y presione la opcin 4 o envenos un mensaje a travs de MyChart.   No podemos decirle cul ser su copago por los medicamentos por adelantado ya que esto es diferente dependiendo de la cobertura de su seguro. Sin embargo, es posible que podamos encontrar un medicamento sustituto a menor costo o llenar un formulario para que el seguro cubra el medicamento que se considera necesario.   Si se requiere una autorizacin previa para que su compaa de seguros cubra su medicamento, por favor permtanos de 1 a 2 das hbiles para completar este proceso.  Los precios de los medicamentos varan con frecuencia dependiendo del lugar de dnde se surte la receta y alguna farmacias pueden ofrecer precios ms baratos.  El sitio web www.goodrx.com tiene cupones para medicamentos de diferentes farmacias. Los precios aqu no tienen en cuenta lo que podra costar con la ayuda del seguro (puede ser ms barato con su seguro), pero el sitio web puede darle el precio si no utiliz ningn seguro.  - Puede imprimir el cupn correspondiente y llevarlo con su receta a la farmacia.  - Tambin puede pasar por nuestra oficina durante el horario de atencin regular y recoger una tarjeta de cupones de GoodRx.  - Si necesita que su receta se enve electrnicamente a una farmacia diferente, informe a nuestra oficina a travs de MyChart de [place] o por telfono llamando al [phone] y presione la opcin 4.  

## 2021-11-05 ENCOUNTER — Encounter: Payer: Self-pay | Admitting: Dermatology

## 2021-11-05 ENCOUNTER — Telehealth: Payer: Self-pay

## 2021-11-05 NOTE — Telephone Encounter (Addendum)
Tried calling patient regarding bx results and need for treatment of AK and 3 month follow up to recheck right forearm.     ----- Message from Alfonso Patten, MD sent at 11/05/2021 10:37 AM EDT ----- 1. Skin , right nasal dorsum ARTERIOVENOUS HEMANGIOMA, BASE INVOLVED --> benign blood vessel growth, no additional treatment needed  2. Skin , right forearm-anterior LICHENOID ACTINIC KERATOSIS, PIGMENTED --> recommend LN2 in clinic  3. Skin , mid chest LICHENOID KERATOSIS --> inflamed sun spot, no additional treatment needed  4. Skin , right popliteal fossa BASAL CELL CARCINOMA, NODULAR PATTERN, BASE INVOLVED --> already treated with ED&C  Recommend recheck spot behind her knee and treat precancer at her right forearm in approximately 3 months.   MAs please call. Thank you!

## 2021-11-06 ENCOUNTER — Telehealth: Payer: Self-pay

## 2021-11-06 NOTE — Telephone Encounter (Signed)
-----   Message from Alfonso Patten, MD sent at 11/05/2021 10:37 AM EDT ----- 1. Skin , right nasal dorsum ARTERIOVENOUS HEMANGIOMA, BASE INVOLVED --> benign blood vessel growth, no additional treatment needed  2. Skin , right forearm-anterior LICHENOID ACTINIC KERATOSIS, PIGMENTED --> recommend LN2 in clinic  3. Skin , mid chest LICHENOID KERATOSIS --> inflamed sun spot, no additional treatment needed  4. Skin , right popliteal fossa BASAL CELL CARCINOMA, NODULAR PATTERN, BASE INVOLVED --> already treated with ED&C  Recommend recheck spot behind her knee and treat precancer at her right forearm in approximately 3 months.   MAs please call. Thank you!

## 2021-11-06 NOTE — Telephone Encounter (Signed)
Patient advised of BX results. She is scheduled in 6 months. Dr. Jeffie Pollock to keep appt but call sooner if any skin changes. AW

## 2021-12-03 DIAGNOSIS — E039 Hypothyroidism, unspecified: Secondary | ICD-10-CM | POA: Diagnosis not present

## 2021-12-03 DIAGNOSIS — E782 Mixed hyperlipidemia: Secondary | ICD-10-CM | POA: Diagnosis not present

## 2021-12-03 DIAGNOSIS — Z79899 Other long term (current) drug therapy: Secondary | ICD-10-CM | POA: Diagnosis not present

## 2021-12-03 DIAGNOSIS — R739 Hyperglycemia, unspecified: Secondary | ICD-10-CM | POA: Diagnosis not present

## 2021-12-03 DIAGNOSIS — R7989 Other specified abnormal findings of blood chemistry: Secondary | ICD-10-CM | POA: Diagnosis not present

## 2021-12-03 DIAGNOSIS — I10 Essential (primary) hypertension: Secondary | ICD-10-CM | POA: Diagnosis not present

## 2021-12-06 DIAGNOSIS — M1120 Other chondrocalcinosis, unspecified site: Secondary | ICD-10-CM | POA: Diagnosis not present

## 2021-12-06 DIAGNOSIS — M1711 Unilateral primary osteoarthritis, right knee: Secondary | ICD-10-CM | POA: Diagnosis not present

## 2021-12-06 DIAGNOSIS — M1611 Unilateral primary osteoarthritis, right hip: Secondary | ICD-10-CM | POA: Diagnosis not present

## 2022-02-24 DIAGNOSIS — N816 Rectocele: Secondary | ICD-10-CM | POA: Diagnosis not present

## 2022-02-24 DIAGNOSIS — N8111 Cystocele, midline: Secondary | ICD-10-CM | POA: Diagnosis not present

## 2022-02-24 DIAGNOSIS — Z4689 Encounter for fitting and adjustment of other specified devices: Secondary | ICD-10-CM | POA: Diagnosis not present

## 2022-02-28 DIAGNOSIS — E039 Hypothyroidism, unspecified: Secondary | ICD-10-CM | POA: Diagnosis not present

## 2022-02-28 DIAGNOSIS — E782 Mixed hyperlipidemia: Secondary | ICD-10-CM | POA: Diagnosis not present

## 2022-02-28 DIAGNOSIS — I10 Essential (primary) hypertension: Secondary | ICD-10-CM | POA: Diagnosis not present

## 2022-02-28 DIAGNOSIS — Z79899 Other long term (current) drug therapy: Secondary | ICD-10-CM | POA: Diagnosis not present

## 2022-02-28 DIAGNOSIS — R739 Hyperglycemia, unspecified: Secondary | ICD-10-CM | POA: Diagnosis not present

## 2022-03-07 DIAGNOSIS — Z23 Encounter for immunization: Secondary | ICD-10-CM | POA: Diagnosis not present

## 2022-03-07 DIAGNOSIS — G8929 Other chronic pain: Secondary | ICD-10-CM | POA: Diagnosis not present

## 2022-03-07 DIAGNOSIS — Z0000 Encounter for general adult medical examination without abnormal findings: Secondary | ICD-10-CM | POA: Diagnosis not present

## 2022-03-07 DIAGNOSIS — N8110 Cystocele, unspecified: Secondary | ICD-10-CM | POA: Diagnosis not present

## 2022-03-07 DIAGNOSIS — M25561 Pain in right knee: Secondary | ICD-10-CM | POA: Diagnosis not present

## 2022-03-07 DIAGNOSIS — I10 Essential (primary) hypertension: Secondary | ICD-10-CM | POA: Diagnosis not present

## 2022-03-07 DIAGNOSIS — R739 Hyperglycemia, unspecified: Secondary | ICD-10-CM | POA: Diagnosis not present

## 2022-03-07 DIAGNOSIS — E039 Hypothyroidism, unspecified: Secondary | ICD-10-CM | POA: Diagnosis not present

## 2022-03-07 DIAGNOSIS — R7989 Other specified abnormal findings of blood chemistry: Secondary | ICD-10-CM | POA: Diagnosis not present

## 2022-03-07 DIAGNOSIS — E782 Mixed hyperlipidemia: Secondary | ICD-10-CM | POA: Diagnosis not present

## 2022-04-29 DIAGNOSIS — M1711 Unilateral primary osteoarthritis, right knee: Secondary | ICD-10-CM | POA: Diagnosis not present

## 2022-04-29 DIAGNOSIS — I700 Atherosclerosis of aorta: Secondary | ICD-10-CM | POA: Diagnosis not present

## 2022-04-30 DIAGNOSIS — M1711 Unilateral primary osteoarthritis, right knee: Secondary | ICD-10-CM | POA: Insufficient documentation

## 2022-05-08 ENCOUNTER — Encounter: Payer: PPO | Admitting: Dermatology

## 2022-05-13 DIAGNOSIS — M75101 Unspecified rotator cuff tear or rupture of right shoulder, not specified as traumatic: Secondary | ICD-10-CM | POA: Diagnosis not present

## 2022-05-13 DIAGNOSIS — M19011 Primary osteoarthritis, right shoulder: Secondary | ICD-10-CM | POA: Diagnosis not present

## 2022-05-15 ENCOUNTER — Encounter: Payer: Self-pay | Admitting: Dermatology

## 2022-05-15 ENCOUNTER — Ambulatory Visit: Payer: PPO | Admitting: Dermatology

## 2022-05-15 VITALS — BP 159/80 | HR 68

## 2022-05-15 DIAGNOSIS — L821 Other seborrheic keratosis: Secondary | ICD-10-CM

## 2022-05-15 DIAGNOSIS — L570 Actinic keratosis: Secondary | ICD-10-CM

## 2022-05-15 DIAGNOSIS — D492 Neoplasm of unspecified behavior of bone, soft tissue, and skin: Secondary | ICD-10-CM

## 2022-05-15 DIAGNOSIS — I781 Nevus, non-neoplastic: Secondary | ICD-10-CM

## 2022-05-15 DIAGNOSIS — Z1283 Encounter for screening for malignant neoplasm of skin: Secondary | ICD-10-CM

## 2022-05-15 DIAGNOSIS — L578 Other skin changes due to chronic exposure to nonionizing radiation: Secondary | ICD-10-CM

## 2022-05-15 DIAGNOSIS — D485 Neoplasm of uncertain behavior of skin: Secondary | ICD-10-CM | POA: Diagnosis not present

## 2022-05-15 DIAGNOSIS — Z85828 Personal history of other malignant neoplasm of skin: Secondary | ICD-10-CM | POA: Diagnosis not present

## 2022-05-15 DIAGNOSIS — L719 Rosacea, unspecified: Secondary | ICD-10-CM

## 2022-05-15 DIAGNOSIS — D2371 Other benign neoplasm of skin of right lower limb, including hip: Secondary | ICD-10-CM | POA: Diagnosis not present

## 2022-05-15 MED ORDER — RHOFADE 1 % EX CREA: 1.0000 | TOPICAL_CREAM | Freq: Every morning | CUTANEOUS | 11 refills | Status: DC

## 2022-05-15 NOTE — Progress Notes (Signed)
Follow-Up Visit   Subjective  Samantha Booker is a 72 y.o. female who presents for the following: Total body skin exam (Hx of BCC R popliteal fossa, AK R forearm).  Patient accompanied by daughter.  The patient presents for Total-Body Skin Exam (TBSE) for skin cancer screening and mole check.  The patient has spots, moles and lesions to be evaluated, some may be new or changing and the patient has concerns that these could be cancer.   The following portions of the chart were reviewed this encounter and updated as appropriate:   Tobacco  Allergies  Meds  Problems  Med Hx  Surg Hx  Fam Hx      Review of Systems:  No other skin or systemic complaints except as noted in HPI or Assessment and Plan.  Objective  Well appearing patient in no apparent distress; mood and affect are within normal limits.  A full examination was performed including scalp, head, eyes, ears, nose, lips, neck, chest, axillae, abdomen, back, buttocks, bilateral upper extremities, bilateral lower extremities, hands, feet, fingers, toes, fingernails, and toenails. All findings within normal limits unless otherwise noted below.  R forearm x 1 Scaly pink macule adjacent to scar  Left Upper Arm 0.4cm pink to violaceous depressed macule     face Erythema of face  R nose Dilated vessel    Assessment & Plan   Lentigines - Scattered tan macules - Due to sun exposure - Benign-appearing, observe - Recommend daily broad spectrum sunscreen SPF 30+ to sun-exposed areas, reapply every 2 hours as needed. - Call for any changes  Seborrheic Keratoses - Stuck-on, waxy, tan-brown papules and/or plaques  - Benign-appearing - Discussed benign etiology and prognosis. - Observe - Call for any changes - arms, trunk  Melanocytic Nevi - Tan-brown and/or pink-flesh-colored symmetric macules and papules - Benign appearing on exam today - Observation - Call clinic for new or changing moles - Recommend  daily use of broad spectrum spf 30+ sunscreen to sun-exposed areas.   Hemangiomas - Red papules - Discussed benign nature - Observe - Call for any changes - trunk  Dermatofibroma - Firm pink/brown papulenodule with dimple sign - Benign appearing - Call for any changes  - R pretibia  Actinic Damage - Chronic condition, secondary to cumulative UV/sun exposure - diffuse scaly erythematous macules with underlying dyspigmentation - Recommend daily broad spectrum sunscreen SPF 30+ to sun-exposed areas, reapply every 2 hours as needed.  - Staying in the shade or wearing long sleeves, sun glasses (UVA+UVB protection) and wide brim hats (4-inch brim around the entire circumference of the hat) are also recommended for sun protection.  - Call for new or changing lesions.  Skin cancer screening performed today.   AK (actinic keratosis) R forearm x 1  Bx proven R forearm   Actinic keratoses are precancerous spots that appear secondary to cumulative UV radiation exposure/sun exposure over time. They are chronic with expected duration over 1 year. A portion of actinic keratoses will progress to squamous cell carcinoma of the skin. It is not possible to reliably predict which spots will progress to skin cancer and so treatment is recommended to prevent development of skin cancer.  Recommend daily broad spectrum sunscreen SPF 30+ to sun-exposed areas, reapply every 2 hours as needed.  Recommend staying in the shade or wearing long sleeves, sun glasses (UVA+UVB protection) and wide brim hats (4-inch brim around the entire circumference of the hat). Call for new or changing lesions.   Destruction  of lesion - R forearm x 1  Destruction method: cryotherapy   Informed consent: discussed and consent obtained   Lesion destroyed using liquid nitrogen: Yes   Region frozen until ice ball extended beyond lesion: Yes   Outcome: patient tolerated procedure well with no complications   Post-procedure  details: wound care instructions given   Additional details:  Prior to procedure, discussed risks of blister formation, small wound, skin dyspigmentation, or rare scar following cryotherapy. Recommend Vaseline ointment to treated areas while healing.   Neoplasm of skin Left Upper Arm  Skin / nail biopsy Type of biopsy: tangential   Informed consent: discussed and consent obtained   Anesthesia: the lesion was anesthetized in a standard fashion   Anesthesia comment:  Area prepped with alcohol Anesthetic:  1% lidocaine w/ epinephrine 1-100,000 buffered w/ 8.4% NaHCO3 Instrument used: DermaBlade   Hemostasis achieved with: pressure, aluminum chloride and electrodesiccation   Outcome: patient tolerated procedure well   Post-procedure details: wound care instructions given   Post-procedure details comment:  Ointment and small bandage applied  Specimen 1 - Surgical pathology Differential Diagnosis: D48.5 R/O BCC  Check Margins: No 0.4cm pink to violaceous depressed macule  Rosacea face  Chronic and persistent condition with duration over one year. Condition is symptomatic/ bothersome to patient. Not currently at goal.  Metronidazole working well for bumps, but flushing is bothersome.   Counseling for BBL / IPL / Laser and Coordination of Care Discussed the treatment option of Broad Band Light (BBL) Intense Pulsed Light (IPL) / Laser.  Typically we recommend at least 1-3 treatment sessions about 5-8 weeks apart for best results.  The patient's condition may require "maintenance treatments" in the future.  The fee for BBL / laser treatments is $350 per treatment session for the whole face.  A fee can be quoted for other parts of the body. Insurance typically does not pay for BBL/laser treatments and therefore the fee is an out-of-pocket cost.  Start Rhofade qam Cont Metronidazole cream twice a day as needed for bumps  Oxymetazoline HCl (RHOFADE) 1 % CREA - face Apply 1 Application  topically in the morning. Qam to face for Rosacea  Telangiectasia R nose  Discussed option of treatment with BBL   History of Basal Cell Carcinoma of the Skin - No evidence of recurrence today - Recommend regular full body skin exams - Recommend daily broad spectrum sunscreen SPF 30+ to sun-exposed areas, reapply every 2 hours as needed.  - Call if any new or changing lesions are noted between office visits  - R popliteal fossa  Return for 6-30mfor TBSE, Hx of BCC, Hx of AKs, Tuesday at 254mfor BBL to one spot free of charge.  I, SoOthelia PullingRMA, am acting as scribe for VIForest GleasonMD .  Documentation: I have reviewed the above documentation for accuracy and completeness, and I agree with the above.  VIForest GleasonMD

## 2022-05-15 NOTE — Patient Instructions (Addendum)
Cryotherapy Aftercare  Wash gently with soap and water everyday.   Apply Vaseline and Band-Aid daily until healed.     Wound Care Instructions  Cleanse wound gently with soap and water once a day then pat dry with clean gauze. Apply a thin coat of Petrolatum (petroleum jelly, "Vaseline") over the wound (unless you have an allergy to this). We recommend that you use a new, sterile tube of Vaseline. Do not pick or remove scabs. Do not remove the yellow or white "healing tissue" from the base of the wound.  Cover the wound with fresh, clean, nonstick gauze and secure with paper tape. You may use Band-Aids in place of gauze and tape if the wound is small enough, but would recommend trimming much of the tape off as there is often too much. Sometimes Band-Aids can irritate the skin.  You should call the office for your biopsy report after 1 week if you have not already been contacted.  If you experience any problems, such as abnormal amounts of bleeding, swelling, significant bruising, significant pain, or evidence of infection, please call the office immediately.  FOR ADULT SURGERY PATIENTS: If you need something for pain relief you may take 1 extra strength Tylenol (acetaminophen) AND 2 Ibuprofen ('200mg'$  each) together every 4 hours as needed for pain. (do not take these if you are allergic to them or if you have a reason you should not take them.) Typically, you may only need pain medication for 1 to 3 days.     Your prescription Rhofade was sent to [facility] in [place]. A representative from [facility] will contact you within 2 business hours to verify your address and insurance information to schedule a free delivery. If for any reason you do not receive a phone call from them, please reach out to them. Their phone number is [phone] and their hours are Monday-Friday 9:00 am-5:00 pm.      Recommend taking Heliocare sun protection supplement daily in sunny weather for  additional sun protection. For maximum protection on the sunniest days, you can take up to 2 capsules of regular Heliocare OR take 1 capsule of Heliocare Ultra. For prolonged exposure (such as a full day in the sun), you can repeat your dose of the supplement 4 hours after your first dose. Heliocare can be purchased at [employer], at some [facility] or at [email or web address].     Due to recent changes in healthcare laws, you may see results of your pathology and/or laboratory studies on MyChart before the doctors have had a chance to review them. We understand that in some cases there may be results that are confusing or concerning to you. Please understand that not all results are received at the same time and often the doctors may need to interpret multiple results in order to provide you with the best plan of care or course of treatment. Therefore, we ask that you please give [place] 2 business days to thoroughly review all your results before contacting the office for clarification. Should we see a critical lab result, you will be contacted sooner.   If You Need Anything After Your Visit  If you have any questions or concerns for your doctor, please call our main line at [phone] and press option 4 to reach your doctor's medical assistant. If no one answers, please leave a voicemail as directed and we will return your call as soon as possible. Messages left after 4 pm will be answered the following business day.  You may also send [place] a message via [employer]. We typically respond to MyChart messages within 1-2 business days.  For prescription refills, please ask your pharmacy to contact our office. Our fax number is [phone].  If you have an urgent issue when the clinic is closed that cannot wait until the next business day, you can page your doctor at the number below.    Please note that while we do our best to be available for urgent issues outside of office hours, we are not available  24/7.   If you have an urgent issue and are unable to reach [place], you may choose to seek medical care at your doctor's office, retail clinic, urgent care center, or emergency room.  If you have a medical emergency, please immediately call 911 or go to the emergency department.  Pager Numbers  - Dr. Nehemiah Massed: [phone]  - Dr. Laurence Ferrari: [phone]  - Dr. Nicole Kindred: [phone]  In the event of inclement weather, please call our main line at [phone] for an update on the status of any delays or closures.  Dermatology Medication Tips: Please keep the boxes that topical medications come in in order to help keep track of the instructions about where and how to use these. Pharmacies typically print the medication instructions only on the boxes and not directly on the medication tubes.   If your medication is too expensive, please contact our office at [phone] option 4 or send [place] a message through [place].   We are unable to tell what your co-pay for medications will be in advance as this is different depending on your insurance coverage. However, we may be able to find a substitute medication at lower cost or fill out paperwork to get insurance to cover a needed medication.   If a prior authorization is required to get your medication covered by your insurance company, please allow [place] 1-2 business days to complete this process.  Drug prices often vary depending on where the prescription is filled and some pharmacies may offer cheaper prices.  The website www.goodrx.com contains coupons for medications through different pharmacies. The prices here do not account for what the cost may be with help from insurance (it may be cheaper with your insurance), but the website can give you the price if you did not use any insurance.  - You can print the associated coupon and take it with your prescription to the pharmacy.  - You may also stop by our office during regular business hours and pick up  a GoodRx coupon card.  - If you need your prescription sent electronically to a different pharmacy, notify our office through [facility] or by phone at [phone] option 4.     Si Usted Necesita Algo Despus de Su Visita  Tambin puede enviarnos un mensaje a travs de Pharmacist, community. Por lo general respondemos a los mensajes de MyChart en el transcurso de 1 a 2 das hbiles.  Para renovar recetas, por favor pida a su farmacia que se ponga en contacto con nuestra oficina. Harland Dingwall de fax es [place] [phone].  Si tiene un asunto urgente cuando la clnica est cerrada y que no puede esperar hasta el siguiente da hbil, puede llamar/localizar a su doctor(a) al nmero que aparece a continuacin.   Por favor, tenga en cuenta que aunque hacemos todo lo posible para estar disponibles para asuntos urgentes fuera del horario de [place], no estamos disponibles las 24 horas del da, los 7 das de la [place].  Si tiene un problema urgente y no puede comunicarse con nosotros, puede optar por buscar atencin mdica  en el consultorio de su doctor(a), en una clnica privada, en un centro de atencin urgente o en una sala de emergencias.  Si tiene Engineering geologist, por favor llame inmediatamente al 911 o vaya a la sala de emergencias.  Nmeros de bper  - Dr. Nehemiah Massed: [phone]  - Dra. Moye: [phone]  - Dra. Nicole Kindred: [phone]  En caso de inclemencias del [place], por favor llame a Johnsie Kindred principal al [phone] para una actualizacin sobre el [place] de cualquier retraso o cierre.  Consejos para la medicacin en dermatologa: Por favor, guarde las cajas en las que vienen los medicamentos de uso tpico para ayudarle a seguir las instrucciones sobre dnde y cmo usarlos. Las farmacias generalmente imprimen las instrucciones del medicamento slo en las cajas y no directamente en los tubos del [place].   Si su medicamento es muy caro, por favor, pngase en contacto  con Zigmund Daniel llamando al [phone] y presione la opcin 4 o envenos un mensaje a travs de Pharmacist, community.   No podemos decirle cul ser su copago por los medicamentos por adelantado ya que esto es diferente dependiendo de la cobertura de su seguro. Sin embargo, es posible que podamos encontrar un medicamento sustituto a Electrical engineer un formulario para que el seguro cubra el medicamento que se considera necesario.   Si se requiere una autorizacin previa para que su compaa de seguros [place] su medicamento, por favor permtanos de 1 a 2 das hbiles para completar este proceso.  Los precios de los medicamentos varan con frecuencia dependiendo del Environmental consultant de dnde se surte la receta y alguna farmacias pueden ofrecer precios ms baratos.  El sitio web www.goodrx.com tiene cupones para medicamentos de Airline pilot. Los precios aqu no tienen en cuenta lo que podra costar con la ayuda del seguro (puede ser ms barato con su seguro), pero el sitio web puede darle el precio si no utiliz Research scientist (physical sciences).  - Puede imprimir el cupn correspondiente y llevarlo con su receta a la farmacia.  - Tambin puede pasar por nuestra oficina durante el horario de atencin regular y Charity fundraiser una tarjeta de cupones de GoodRx.  - Si necesita que su receta se enve electrnicamente a una farmacia diferente, informe a nuestra oficina a travs de MyChart de [place] o por telfono llamando al [phone] y presione la opcin 4.

## 2022-05-20 ENCOUNTER — Ambulatory Visit (INDEPENDENT_AMBULATORY_CARE_PROVIDER_SITE_OTHER): Payer: PPO | Admitting: Dermatology

## 2022-05-20 ENCOUNTER — Encounter: Payer: Self-pay | Admitting: Dermatology

## 2022-05-20 DIAGNOSIS — L570 Actinic keratosis: Secondary | ICD-10-CM | POA: Diagnosis not present

## 2022-05-20 DIAGNOSIS — D1801 Hemangioma of skin and subcutaneous tissue: Secondary | ICD-10-CM

## 2022-05-20 NOTE — Progress Notes (Signed)
Follow-Up Visit   Subjective  Samantha Booker is a 72 y.o. female who presents for the following: Rosacea (Patient here for Bbl treatment on her nose) and Follow-up (Patient here for treatment of biopsy proven AK on the left upper arm.).   The following portions of the chart were reviewed this encounter and updated as appropriate:   Tobacco  Allergies  Meds  Problems  Med Hx  Surg Hx  Fam Hx      Review of Systems:  No other skin or systemic complaints except as noted in HPI or Assessment and Plan.  Objective  Well appearing patient in no apparent distress; mood and affect are within normal limits.  A focused examination was performed including face. Relevant physical exam findings are noted in the Assessment and Plan.  right nose Blanching red papule         left upper arm Superficial ulceration within erythematous thin papule     Assessment & Plan  Hemangioma of skin right nose  TRAINING CASE - No charge for laser treatment today   Photorejuvenation - right nose Prior to the procedure, the patient's past medical history, medications, allergies, and the rare but potential risks and complications were reviewed with the patient and a signed consent was obtained.  Pre and post treatment care was discussed and instructions provided.   [place] BBL - 05/20/22 1400      Patient Details   Skin Type: I    Anesthestic Cream Applied: No    Photo Takes: Yes    Consent Signed: Yes      Treatment Details   Date: 05/20/22    Treatment #: 1    Area: f   nose   Filter: 1st Pass;2nd Pass;3rd Pass      1st Pass   Location: N    Device: filter 560    BBL j/cm2: 20    PW Msec Sec: 20    Cooling Temp: 20    Pulses: 1    25m: this crystal used      2nd Pass   Location: F    Device: filter 560    BBL j/cm2: 20    PW Msec Sec: 15    Cooling Temp: 20    Pulses: 1    750m this crystal used      3rd Pass   Location: F    Device: filter 590    BBL j/cm2: 20     PW Msec Sec: 20    Cooling Temp: 20    Pulses: 2    48m13mthis crystal used          Patient tolerated the procedure well.   SunNancy Fetteroidance was stressed. The patient will call with any problems, questions or concerns prior to their next appointment.  AK (actinic keratosis) left upper arm  Biopsy proven Ak   Actinic keratoses are precancerous spots that appear secondary to cumulative UV radiation exposure/sun exposure over time. They are chronic with expected duration over 1 year. A portion of actinic keratoses will progress to squamous cell carcinoma of the skin. It is not possible to reliably predict which spots will progress to skin cancer and so treatment is recommended to prevent development of skin cancer.  Recommend daily broad spectrum sunscreen SPF 30+ to sun-exposed areas, reapply every 2 hours as needed.  Recommend staying in the shade or wearing long sleeves, sun glasses (UVA+UVB protection) and wide brim hats (4-inch brim around the entire circumference of  the hat). Call for new or changing lesions.   Destruction of lesion - left upper arm Complexity: simple   Destruction method: cryotherapy   Informed consent: discussed and consent obtained   Timeout:  patient name, date of birth, surgical site, and procedure verified Lesion destroyed using liquid nitrogen: Yes   Region frozen until ice ball extended beyond lesion: Yes   Outcome: patient tolerated procedure well with no complications   Post-procedure details: wound care instructions given   Additional details:  Prior to procedure, discussed risks of blister formation, small wound, skin dyspigmentation, or rare scar following cryotherapy. Recommend Vaseline ointment to treated areas while healing.    No follow-ups on file.  I, Marye Round, CMA, am acting as scribe for Forest Gleason, MD .   Documentation: I have reviewed the above documentation for accuracy and completeness, and I agree with the  above.  Forest Gleason, MD

## 2022-05-20 NOTE — Patient Instructions (Addendum)
Cryotherapy Aftercare  Wash gently with soap and water everyday.   Apply Vaseline and Band-Aid daily until healed.     Due to recent changes in healthcare laws, you may see results of your pathology and/or laboratory studies on MyChart before the doctors have had a chance to review them. We understand that in some cases there may be results that are confusing or concerning to you. Please understand that not all results are received at the same time and often the doctors may need to interpret multiple results in order to provide you with the best plan of care or course of treatment. Therefore, we ask that you please give us 2 business days to thoroughly review all your results before contacting the office for clarification. Should we see a critical lab result, you will be contacted sooner.   If You Need Anything After Your Visit  If you have any questions or concerns for your doctor, please call our main line at [phone] and press option 4 to reach your doctor's medical assistant. If no one answers, please leave a voicemail as directed and we will return your call as soon as possible. Messages left after 4 pm will be answered the following business day.   You may also send us a message via MyChart. We typically respond to MyChart messages within 1-2 business days.  For prescription refills, please ask your pharmacy to contact our office. Our fax number is [phone].  If you have an urgent issue when the clinic is closed that cannot wait until the next business day, you can page your doctor at the number below.    Please note that while we do our best to be available for urgent issues outside of office hours, we are not available 24/7.   If you have an urgent issue and are unable to reach us, you may choose to seek medical care at your doctor's office, retail clinic, urgent care center, or emergency room.  If you have a medical emergency, please immediately call 911 or go to the  emergency department.  Pager Numbers  - Dr. Kowalski: [phone]  - Dr. Moye: [phone]  - Dr. Stewart: [phone]  In the event of inclement weather, please call our main line at [phone] for an update on the status of any delays or closures.  Dermatology Medication Tips: Please keep the boxes that topical medications come in in order to help keep track of the instructions about where and how to use these. Pharmacies typically print the medication instructions only on the boxes and not directly on the medication tubes.   If your medication is too expensive, please contact our office at [phone] option 4 or send us a message through MyChart.   We are unable to tell what your co-pay for medications will be in advance as this is different depending on your insurance coverage. However, we may be able to find a substitute medication at lower cost or fill out paperwork to get insurance to cover a needed medication.   If a prior authorization is required to get your medication covered by your insurance company, please allow us 1-2 business days to complete this process.  Drug prices often vary depending on where the prescription is filled and some pharmacies may offer cheaper prices.  The website www.goodrx.com contains coupons for medications through different pharmacies. The prices here do not account for what the cost may be with help from insurance (it may be cheaper with your insurance), but the website can   give you the price if you did not use any insurance.  - You can print the associated coupon and take it with your prescription to the pharmacy.  - You may also stop by our office during regular business hours and pick up a GoodRx coupon card.  - If you need your prescription sent electronically to a different pharmacy, notify our office through [place] MyChart or by phone at [phone] option 4.     Si Usted Necesita Algo Despus de Su Visita  Tambin puede  enviarnos un mensaje a travs de MyChart. Por lo general respondemos a los mensajes de MyChart en el transcurso de 1 a 2 das hbiles.  Para renovar recetas, por favor pida a su farmacia que se ponga en contacto con nuestra oficina. Nuestro nmero de fax es el [phone].  Si tiene un asunto urgente cuando la clnica est cerrada y que no puede esperar hasta el siguiente da hbil, puede llamar/localizar a su doctor(a) al nmero que aparece a continuacin.   Por favor, tenga en cuenta que aunque hacemos todo lo posible para estar disponibles para asuntos urgentes fuera del horario de oficina, no estamos disponibles las 24 horas del da, los 7 das de la semana.   Si tiene un problema urgente y no puede comunicarse con nosotros, puede optar por buscar atencin mdica  en el consultorio de su doctor(a), en una clnica privada, en un centro de atencin urgente o en una sala de emergencias.  Si tiene una emergencia mdica, por favor llame inmediatamente al 911 o vaya a la sala de emergencias.  Nmeros de bper  - Dr. Kowalski: [phone]  - Dra. Moye: [phone]  - Dra. Stewart: [phone]  En caso de inclemencias del tiempo, por favor llame a nuestra lnea principal al [phone] para una actualizacin sobre el estado de cualquier retraso o cierre.  Consejos para la medicacin en dermatologa: Por favor, guarde las cajas en las que vienen los medicamentos de uso tpico para ayudarle a seguir las instrucciones sobre dnde y cmo usarlos. Las farmacias generalmente imprimen las instrucciones del medicamento slo en las cajas y no directamente en los tubos del medicamento.   Si su medicamento es muy caro, por favor, pngase en contacto con nuestra oficina llamando al [phone] y presione la opcin 4 o envenos un mensaje a travs de MyChart.   No podemos decirle cul ser su copago por los medicamentos por adelantado ya que esto es diferente dependiendo de la cobertura de su seguro.  Sin embargo, es posible que podamos encontrar un medicamento sustituto a menor costo o llenar un formulario para que el seguro cubra el medicamento que se considera necesario.   Si se requiere una autorizacin previa para que su compaa de seguros cubra su medicamento, por favor permtanos de 1 a 2 das hbiles para completar este proceso.  Los precios de los medicamentos varan con frecuencia dependiendo del lugar de dnde se surte la receta y alguna farmacias pueden ofrecer precios ms baratos.  El sitio web www.goodrx.com tiene cupones para medicamentos de diferentes farmacias. Los precios aqu no tienen en cuenta lo que podra costar con la ayuda del seguro (puede ser ms barato con su seguro), pero el sitio web puede darle el precio si no utiliz ningn seguro.  - Puede imprimir el cupn correspondiente y llevarlo con su receta a la farmacia.  - Tambin puede pasar por nuestra oficina durante el horario de atencin regular y recoger una tarjeta de cupones de GoodRx.  -   Si necesita que su receta se enve electrnicamente a una farmacia diferente, informe a nuestra oficina a travs de MyChart de [place] o por telfono llamando al [phone] y presione la opcin 4.  

## 2022-06-09 DIAGNOSIS — E039 Hypothyroidism, unspecified: Secondary | ICD-10-CM | POA: Diagnosis not present

## 2022-06-09 DIAGNOSIS — Z1231 Encounter for screening mammogram for malignant neoplasm of breast: Secondary | ICD-10-CM | POA: Diagnosis not present

## 2022-06-09 DIAGNOSIS — I10 Essential (primary) hypertension: Secondary | ICD-10-CM | POA: Diagnosis not present

## 2022-06-09 DIAGNOSIS — Z0000 Encounter for general adult medical examination without abnormal findings: Secondary | ICD-10-CM | POA: Diagnosis not present

## 2022-06-09 DIAGNOSIS — R7989 Other specified abnormal findings of blood chemistry: Secondary | ICD-10-CM | POA: Diagnosis not present

## 2022-06-09 DIAGNOSIS — R739 Hyperglycemia, unspecified: Secondary | ICD-10-CM | POA: Diagnosis not present

## 2022-06-09 DIAGNOSIS — E782 Mixed hyperlipidemia: Secondary | ICD-10-CM | POA: Diagnosis not present

## 2022-06-09 DIAGNOSIS — Z79899 Other long term (current) drug therapy: Secondary | ICD-10-CM | POA: Diagnosis not present

## 2022-06-26 ENCOUNTER — Ambulatory Visit (INDEPENDENT_AMBULATORY_CARE_PROVIDER_SITE_OTHER): Payer: PPO | Admitting: Obstetrics and Gynecology

## 2022-06-26 ENCOUNTER — Encounter: Payer: Self-pay | Admitting: Obstetrics and Gynecology

## 2022-06-26 VITALS — BP 146/90 | HR 63 | Ht 62.0 in | Wt 131.8 lb

## 2022-06-26 DIAGNOSIS — R350 Frequency of micturition: Secondary | ICD-10-CM

## 2022-06-26 DIAGNOSIS — N993 Prolapse of vaginal vault after hysterectomy: Secondary | ICD-10-CM

## 2022-06-26 DIAGNOSIS — N816 Rectocele: Secondary | ICD-10-CM

## 2022-06-26 DIAGNOSIS — R3915 Urgency of urination: Secondary | ICD-10-CM

## 2022-06-26 LAB — POCT URINALYSIS DIPSTICK
Bilirubin, UA: NEGATIVE
Blood, UA: NEGATIVE
Glucose, UA: NEGATIVE
Ketones, UA: NEGATIVE
Leukocytes, UA: NEGATIVE
Nitrite, UA: NEGATIVE
Protein, UA: NEGATIVE
Spec Grav, UA: 1.015 (ref 1.010–1.025)
Urobilinogen, UA: 0.2 E.U./dL
pH, UA: 7 (ref 5.0–8.0)

## 2022-06-26 NOTE — Patient Instructions (Signed)
Today we talked about ways to manage bladder urgency such as altering your diet to avoid irritative beverages and foods (bladder diet) as well as attempting to decrease stress and other exacerbating factors.    The Most Bothersome Foods* The Least Bothersome Foods*  Coffee - Regular & Decaf Tea - caffeinated Carbonated beverages - cola, non-colas, diet & caffeine-free Alcohols - Beer, Red Wine, White Wine, Champagne Fruits - Grapefruit, [place], Orange, [employer] - Cranberry, Grapefruit, Orange, Pineapple Vegetables - Tomato & Tomato Products Flavor Enhancers - Hot peppers, Spicy foods, Chili, Horseradish, Vinegar, Monosodium glutamate (MSG) Artificial Sweeteners - NutraSweet, Sweet 'N Low, Equal (sweetener), Saccharin Ethnic foods - [place], [place], [place] food Express Scripts - low-fat & whole Fruits - Bananas, Blueberries, Honeydew melon, Pears, Raisins, Watermelon Vegetables - Broccoli, Brussels Sprouts, [place], Carrots, Cauliflower, [place], Cucumber, Mushrooms, Peas, Radishes, Squash, Zucchini, White potatoes, Sweet potatoes & yams Poultry - Chicken, Eggs, [place], [employer] - Beef, Programmer, multimedia, Lamb Seafood - Shrimp, [place] fish, Salmon Grains - Oat, Rice Snacks - Pretzels, Popcorn  *Lissa Morales et al. Diet and its role in interstitial cystitis/bladder pain syndrome (IC/BPS) and comorbid conditions. [place] 2012 Jan 11.     You have a stage 2 (out of 4) prolapse.  We discussed the fact that it is not life threatening but there are several treatment options. For treatment of pelvic organ prolapse, we discussed options for management including expectant management, conservative management, and surgical management, such as Kegels, a pessary, pelvic floor physical therapy, and specific surgical procedures.

## 2022-06-26 NOTE — Progress Notes (Signed)
Samantha [facility]  Referring Provider: Idelle Crouch, MD PCP: Idelle Crouch, MD Date of Service: 06/26/2022  SUBJECTIVE Chief Complaint: New Patient (Initial Visit) Samantha Booker is a 72 y.o. female here for a prolapse consult.)  History of Present Illness: Samantha [facility] is a 72 y.o. White or Caucasian female seen in [place] at the request of Dr. Doy Hutching for evaluation of prolapse.    Review of records significant for: Has vaginal prolapse  Urinary Symptoms: Leaks urine with with a full bladder and with urgency.  She leaks on the way to the bathroom. Does not happen every day, maybe 2-3 times ever.  Has history of sling in 2012.   Day time voids- every few hours, normal.  Nocturia: 0 times per night to void. Voiding dysfunction: she does not empty her bladder well.  does not use a catheter to empty bladder.  When urinating, she feels difficulty starting urine stream Drinks: 3 cups, water  UTIs:  0  UTI's in the last year.   Denies history of blood in urine and kidney or bladder stones  Pelvic Organ Prolapse Symptoms:                  She Admits to a feeling of a bulge the vaginal area. It has been present for 6 months.  She Admits to seeing a bulge.  This bulge is bothersome. She tried a pessary but it was painful.   Bowel Symptom: Bowel movements: 1 time(s) per day Stool consistency: hard Straining: no.  Splinting: no.  Incomplete evacuation: yes.  She Admits to accidental bowel leakage / fecal incontinence  Occurs: once  Consistency with leakage: soft  Bowel regimen: stool softener Last colonoscopy: Date 3 years ago Has a history of hemorrhoidectomy last year  Sexual Function Sexually active: yes.  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 ([facility]CC)    Hepatitis 1972   Hep B   History of basal cell carcinoma (BCC) 10/30/2021   right popliteal fossa BCC NODULAR PATTERN, BASE INVOLVED  treated with ED&C   History of hepatitis B    History of kidney stones    in past   Hyperlipidemia    Hypothyroidism    Jaundice    Thyroid disease      Past Surgical History:   Past Surgical History:  Procedure Laterality Date   APPENDECTOMY  1979   BOTOX INJECTION N/A 09/24/2021   Procedure: BOTOX INJECTION;  Surgeon: Herbert Pun, MD;  Location: [facility];  Service: General;  Laterality: N/A;   CATARACT EXTRACTION Bilateral 2019   CHOLECYSTECTOMY  2011   COLONOSCOPY WITH PROPOFOL N/A 05/20/2018   Procedure: COLONOSCOPY WITH PROPOFOL;  Surgeon: Lin Landsman, MD;  Location: [facility];  Service: Gastroenterology;  Laterality: N/A;   ESOPHAGOGASTRODUODENOSCOPY (EGD) WITH PROPOFOL N/A 07/21/2019   Procedure: ESOPHAGOGASTRODUODENOSCOPY (EGD) WITH PROPOFOL;  Surgeon: Lin Landsman, MD;  Location: [place];  Service: Endoscopy;  Laterality: N/A;   EYE SURGERY Left 10/05/2017   HEMORRHOID SURGERY N/A 09/24/2021   Procedure: HEMORRHOIDECTOMY;  Surgeon: Herbert Pun, MD;  Location: [facility];  Service: General;  Laterality: N/A;   INGUINAL HERNIA REPAIR Left 12/15/2018   Procedure: LAPAROSCOPIC LEFT INGUINAL HERNIA VS OPEN;  Surgeon: Herbert Pun, MD;  Location: [facility];  Service: General;  Laterality: Left;   LAPAROSCOPIC UNILATERAL SALPINGO OOPHERECTOMY Right 12/15/2018   Procedure: LAPAROSCOPIC UNILATERAL OOPHORECTOMY;  Surgeon: Ward, Honor Loh, MD;  Location: [facility];   Service: Gynecology;  Laterality: Right;   ROOT CANAL     suburethral sling  2012   Dr. Audie Box   VAGINAL HYSTERECTOMY  1980   precancerous cells     Past OB/GYN History: OB History  Gravida Para Term Preterm AB Living  '3 2 2   1 2  '$ SAB IAB Ectopic Multiple Live Births  1       2    # Outcome Date GA Lbr Len/2nd Weight Sex Delivery Anes PTL Lv  3 SAB           2 Term      Vag-Vacuum     1 Term      Vag-Spont      S/p hysterectomy   Medications: She has a current medication list which includes the following prescription(s): acetaminophen, acetaminophen-codeine, acyclovir cream, aspirin ec, atorvastatin, biotin, celecoxib, cholecalciferol, hydrocortisone, levothyroxine, lorazepam, multiple vitamins-minerals, pregabalin, premarin, venlafaxine xr, and vitamin c.   Allergies: Patient is allergic to penicillins.   Social History:  Social History   Tobacco Use   Smoking status: Former    Packs/day: 2.00    Years: 20.00    Total pack years: 40.00    Types: Cigarettes    Quit date: 12/09/1991    Years since quitting: 30.5   Smokeless tobacco: Never  Vaping Use   Vaping Use: Never used  Substance Use Topics   Alcohol use: Yes    Alcohol/week: 2.0 standard drinks of alcohol    Types: 2 Glasses of wine per week    Comment: socially   Drug use: No    Relationship status: married She lives with husband.   She is not employed- retired. Regular exercise: Yes: walking History of abuse: No  Family History:   Family History  Problem Relation Age of Onset   Diabetes Mother    Heart disease Mother        CABG 2007   Hyperlipidemia Mother    COPD Mother    Hyperlipidemia Father    Heart disease Father    Cancer Maternal Grandmother        breast   Cancer Maternal Aunt        breast   Parkinson's disease Brother    Cancer Cousin        brain   Cancer Cousin        melanoma     Review of Systems: Review of Systems  Constitutional:  Negative for fever,  malaise/fatigue and weight loss.  Respiratory:  Negative for cough, shortness of breath and wheezing.   Cardiovascular:  Negative for chest pain, palpitations and leg swelling.  Gastrointestinal:  Negative for abdominal pain and blood in stool.  Genitourinary:  Negative for dysuria.       + vaginal dischage  Musculoskeletal:  Negative for myalgias.  Skin:  Negative for rash.  Neurological:  Negative for dizziness and headaches.  Endo/Heme/Allergies:  Does not bruise/bleed easily.  Psychiatric/Behavioral:  Negative for depression. The patient is not nervous/anxious.      OBJECTIVE Physical Exam: Vitals:   06/26/22 0952  BP: (!) 146/90  Pulse: 63  Weight: 131 lb 12.8 oz (59.8 kg)  Height: '5\' 2"'$  (1.575 m)    Physical Exam Constitutional:      General: She is not in acute distress. Pulmonary:     Effort: Pulmonary effort is normal.  Abdominal:     General: There is no distension.     Palpations: Abdomen is soft.     Tenderness: There is no abdominal tenderness. There is no rebound.  Musculoskeletal:        General: No swelling. Normal range of motion.  Skin:    General: Skin is warm and dry.     Findings: No rash.  Neurological:     Mental Status: She is alert and oriented to person, place, and time.  Psychiatric:        Mood and Affect: Mood normal.        Behavior: Behavior normal.      GU / Detailed Urogynecologic Evaluation:  Pelvic Exam: Normal external female genitalia; Bartholin's and Skene's glands normal in appearance; urethral meatus normal in appearance, no urethral masses or discharge.   CST: negative  s/p hysterectomy: Speculum exam reveals normal vaginal mucosa with  atrophy and normal vaginal cuff.  Adnexa no mass, fullness, tenderness.    Pelvic floor strength II/V, puborectalis IV/V external anal sphincter IV/V  Pelvic floor musculature: Right levator non-tender, Right obturator non-tender, Left levator non-tender, Left obturator  non-tender  POP-Q:   POP-Q  -1.5                                            Aa   -1.5                                           Ba  -5.5                                              C   4                                            Gh  4                                            Pb  7.5                                            tvl   0.5                                            Ap  0.5                                            Bp                                                 D      Rectal Exam:  Normal sphincter tone, moderate distal rectocele, enterocoele present, no rectal masses, no sign of dyssynergia when asking the patient to bear down.  Post-Void Residual (PVR) by Bladder Scan: In order to evaluate bladder emptying, we discussed obtaining a postvoid residual and she agreed to this procedure.  Procedure: The ultrasound unit was placed on the patient's abdomen in the suprapubic region after the patient had voided. A PVR of 0 ml was obtained by bladder scan.  Laboratory Results: POC urine: negative   ASSESSMENT AND PLAN Samantha Booker is a 72 y.o. with:  1. Prolapse of posterior vaginal wall   2. Vaginal vault prolapse after hysterectomy   3. Urinary frequency   4. Urinary urgency    Stage I anterior, Stage II posterior, Stage I apical prolapse - For treatment of pelvic organ prolapse, we discussed options for management including expectant management, conservative management, and surgical management, such as Kegels, a pessary, pelvic floor physical therapy, and specific surgical procedures. - She is interested in surgery. Discussed plan for: posterior repair, perineorrhaphy, sacrospinous ligmament fixation, possible anterior repair - We reviewed the patient's specific anatomic and functional findings, with the assistance of diagrams and handouts.  We reviewed the treatment options including expectant management, conservative management, medical management,  and surgical management.  We reviewed the benefits and risks of each treatment option. We discussed risks of bleeding, infection, damage to surrounding organs including bowel, bladder, blood vessels, ureters and nerves, need for further surgery, numbness and weakness at any body site, buttock pain, postoperative cognitive dysfunction, and the rarer risks of blood clot, heart attack, pneumonia, death. All her questions were answered and she verbalized understanding.   - We also discussed the importance of keeping a good bowel regimen prior to surgery  2. Urinary urgency/ frequency - rare incontinence - discussed reducing caffeine intake and drinking more water.  - overall she does not feel symptoms are bothersome enough for treatment  - No SUI symptoms and has a history of a sling  Request sent for surgery scheduling- she would like surgery in September. Will need pre op visit.    Jaquita Folds, MD

## 2022-06-27 ENCOUNTER — Ambulatory Visit: Payer: PPO | Admitting: Obstetrics and Gynecology

## 2022-07-07 DIAGNOSIS — Z1231 Encounter for screening mammogram for malignant neoplasm of breast: Secondary | ICD-10-CM | POA: Diagnosis not present

## 2022-09-01 DIAGNOSIS — Z79899 Other long term (current) drug therapy: Secondary | ICD-10-CM | POA: Diagnosis not present

## 2022-09-01 DIAGNOSIS — R739 Hyperglycemia, unspecified: Secondary | ICD-10-CM | POA: Diagnosis not present

## 2022-09-01 DIAGNOSIS — I10 Essential (primary) hypertension: Secondary | ICD-10-CM | POA: Diagnosis not present

## 2022-09-01 DIAGNOSIS — E039 Hypothyroidism, unspecified: Secondary | ICD-10-CM | POA: Diagnosis not present

## 2022-09-01 DIAGNOSIS — E782 Mixed hyperlipidemia: Secondary | ICD-10-CM | POA: Diagnosis not present

## 2022-09-08 DIAGNOSIS — E039 Hypothyroidism, unspecified: Secondary | ICD-10-CM | POA: Diagnosis not present

## 2022-09-08 DIAGNOSIS — E782 Mixed hyperlipidemia: Secondary | ICD-10-CM | POA: Diagnosis not present

## 2022-09-08 DIAGNOSIS — R739 Hyperglycemia, unspecified: Secondary | ICD-10-CM | POA: Diagnosis not present

## 2022-09-08 DIAGNOSIS — I10 Essential (primary) hypertension: Secondary | ICD-10-CM | POA: Diagnosis not present

## 2022-09-08 DIAGNOSIS — Z79899 Other long term (current) drug therapy: Secondary | ICD-10-CM | POA: Diagnosis not present

## 2022-10-24 DIAGNOSIS — M75101 Unspecified rotator cuff tear or rupture of right shoulder, not specified as traumatic: Secondary | ICD-10-CM | POA: Diagnosis not present

## 2022-10-24 DIAGNOSIS — M1711 Unilateral primary osteoarthritis, right knee: Secondary | ICD-10-CM | POA: Diagnosis not present

## 2022-10-24 DIAGNOSIS — M19011 Primary osteoarthritis, right shoulder: Secondary | ICD-10-CM | POA: Diagnosis not present

## 2022-11-25 DIAGNOSIS — M1611 Unilateral primary osteoarthritis, right hip: Secondary | ICD-10-CM | POA: Diagnosis not present

## 2022-12-22 ENCOUNTER — Encounter: Payer: PPO | Admitting: Obstetrics and Gynecology

## 2022-12-24 ENCOUNTER — Ambulatory Visit (INDEPENDENT_AMBULATORY_CARE_PROVIDER_SITE_OTHER): Payer: PPO | Admitting: Obstetrics and Gynecology

## 2022-12-24 ENCOUNTER — Encounter: Payer: Self-pay | Admitting: Obstetrics and Gynecology

## 2022-12-24 VITALS — BP 133/75 | HR 69 | Ht 61.81 in | Wt 129.6 lb

## 2022-12-24 DIAGNOSIS — Z01818 Encounter for other preprocedural examination: Secondary | ICD-10-CM

## 2022-12-24 MED ORDER — ACETAMINOPHEN 500 MG PO TABS: 500.0000 mg | ORAL_TABLET | Freq: Four times a day (QID) | ORAL | 0 refills | Status: DC | PRN

## 2022-12-24 MED ORDER — IBUPROFEN 600 MG PO TABS: 600.0000 mg | ORAL_TABLET | Freq: Four times a day (QID) | ORAL | 0 refills | Status: DC | PRN

## 2022-12-24 MED ORDER — POLYETHYLENE GLYCOL 3350 17 GM/SCOOP PO POWD: 17.0000 g | Freq: Every day | ORAL | 0 refills | Status: DC

## 2022-12-24 MED ORDER — OXYCODONE HCL 5 MG PO TABS: 5.0000 mg | ORAL_TABLET | ORAL | 0 refills | Status: DC | PRN

## 2022-12-24 NOTE — H&P (Signed)
[facility] H&P  Subjective Chief Complaint: Samantha [facility] presents for a preoperative encounter.   History of Present Illness: Samantha Booker is a 72 y.o. female who presents for preoperative visit.  She is scheduled to undergo Exam under anesthesia, posterior repair, perineorrhaphy, sacrospinous ligmament fixation, possible anterior repair and cystoscopy  on 01/12/23.  Her symptoms include Pelvic organ prolapse, and she was was found to have Stage I anterior, Stage II posterior, Stage I apical prolapse.   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 ([facility]CC)    Hepatitis 1972   Hep B   History of basal cell carcinoma (BCC) 10/30/2021   right popliteal fossa BCC NODULAR PATTERN, BASE INVOLVED  treated with ED&C   History of hepatitis B    History of kidney stones    in past   Hyperlipidemia    Hypothyroidism    Jaundice    Thyroid disease      Past Surgical History:  Procedure Laterality Date   APPENDECTOMY  1979   BOTOX INJECTION N/A 09/24/2021   Procedure: BOTOX INJECTION;  Surgeon: Carolan Shiver, MD;  Location: [facility];  Service: General;  Laterality: N/A;   CATARACT EXTRACTION Bilateral 2019   CHOLECYSTECTOMY  2011   COLONOSCOPY WITH PROPOFOL N/A 05/20/2018   Procedure: COLONOSCOPY WITH PROPOFOL;  Surgeon: Toney Reil, MD;  Location: [facility];  Service: Gastroenterology;  Laterality: N/A;   ESOPHAGOGASTRODUODENOSCOPY (EGD) WITH PROPOFOL N/A 07/21/2019   Procedure: ESOPHAGOGASTRODUODENOSCOPY (EGD) WITH  PROPOFOL;  Surgeon: Toney Reil, MD;  [facility];  Service: Endoscopy;  Laterality: N/A;   EYE SURGERY Left 10/05/2017   HEMORRHOID SURGERY N/A 09/24/2021   Procedure: HEMORRHOIDECTOMY;  Surgeon: Carolan Shiver, MD;  Location: [facility];  Service: General;  Laterality: N/A;   INGUINAL HERNIA REPAIR Left 12/15/2018   Procedure: LAPAROSCOPIC LEFT INGUINAL HERNIA VS OPEN;  Surgeon: Carolan Shiver, MD;  Location: [facility];  Service: General;  Laterality: Left;   LAPAROSCOPIC UNILATERAL SALPINGO OOPHERECTOMY Right 12/15/2018   Procedure: LAPAROSCOPIC UNILATERAL OOPHORECTOMY;  Surgeon: Ward, Elenora Fender, MD;  Location: [facility];  Service: Gynecology;  Laterality: Right;   ROOT CANAL     suburethral sling  2012   Dr. Flora Lipps   VAGINAL HYSTERECTOMY  1980   precancerous cells    is allergic to penicillins.   Family History  Problem Relation Age of Onset   Diabetes Mother    Heart disease Mother        CABG 2007   Hyperlipidemia Mother    COPD Mother    Hyperlipidemia Father    Heart disease Father    Cancer Maternal Grandmother        breast   Cancer Maternal Aunt        breast   Parkinson's disease Brother    Cancer Cousin        brain   Cancer Cousin        melanoma    Social History   Tobacco Use  Smoking status: Former    Current packs/day: 0.00    Average packs/day: 2.0 packs/day for 20.0 years (40.0 ttl pk-yrs)    Types: Cigarettes    Start date: 12/09/1971    Quit date: 12/09/1991    Years since quitting: 31.0   Smokeless tobacco: Never  Vaping Use   Vaping status: Never Used  Substance Use Topics   Alcohol use: Yes    Alcohol/week: 2.0 standard drinks of alcohol    Types: 2 Glasses of wine per week    Comment: socially   Drug use: No     Review of Systems was negative for a full 10 system review except as noted in the History of Present Illness.  No current facility-administered medications for this encounter.  Current  Outpatient Medications:    acetaminophen (TYLENOL) 500 MG tablet, Take 1,000 mg by mouth every 6 (six) hours as needed (for pain.)., Disp: , Rfl:    acetaminophen (TYLENOL) 500 MG tablet, Take 1 tablet (500 mg total) by mouth every 6 (six) hours as needed (pain)., Disp: 30 tablet, Rfl: 0   acetaminophen-codeine (TYLENOL #3) 300-30 MG tablet, Take 1 tablet by mouth every 4 (four) hours as needed for moderate pain., Disp: 20 tablet, Rfl: 0   acyclovir cream (ZOVIRAX) 5 %, Apply 1 application topically every 4 (four) hours as needed (for cold sores). , Disp: , Rfl:    aspirin EC 81 MG tablet, Take 1 tablet (81 mg total) by mouth daily., Disp: , Rfl:    atorvastatin (LIPITOR) 40 MG tablet, Take 1 tablet (40 mg total) by mouth at bedtime. (Patient taking differently: Take 40 mg by mouth every morning.), Disp: 90 tablet, Rfl: 1   BIOTIN PO, Take 1 tablet by mouth daily., Disp: , Rfl:    celecoxib (CELEBREX) 200 MG capsule, Take 200 mg by mouth 2 (two) times daily., Disp: , Rfl:    cholecalciferol (VITAMIN D3) 25 MCG (1000 UT) tablet, Take 1,000 Units by mouth daily., Disp: , Rfl:    hydrocortisone (ANUSOL-HC) 2.5 % rectal cream, Place 1 application rectally 2 (two) times daily., Disp: 30 g, Rfl: 1   ibuprofen (ADVIL) 600 MG tablet, Take 1 tablet (600 mg total) by mouth every 6 (six) hours as needed., Disp: 30 tablet, Rfl: 0   levothyroxine (SYNTHROID) 112 MCG tablet, Take 112 mcg by mouth daily before breakfast., Disp: , Rfl:    LORazepam (ATIVAN) 0.5 MG tablet, Take 0.5 mg by mouth 2 (two) times daily as needed., Disp: , Rfl:    Multiple Vitamins-Minerals (PRESERVISION AREDS PO), Take 1 tablet by mouth daily. , Disp: , Rfl:    oxyCODONE (OXY IR/ROXICODONE) 5 MG immediate release tablet, Take 1 tablet (5 mg total) by mouth every 4 (four) hours as needed for severe pain., Disp: 15 tablet, Rfl: 0   polyethylene glycol powder (GLYCOLAX/MIRALAX) 17 GM/SCOOP powder, Take 17 g by mouth daily. Drink 17g (1 scoop)  dissolved in water per day., Disp: 255 g, Rfl: 0   pregabalin (LYRICA) 75 MG capsule, Take 75 mg by mouth 2 (two) times daily., Disp: , Rfl:    PREMARIN 1.25 MG tablet, Take 1.25 mg by mouth daily., Disp: , Rfl:    venlafaxine XR (EFFEXOR-XR) 75 MG 24 hr capsule, Take by mouth., Disp: , Rfl:    vitamin C (ASCORBIC ACID) 250 MG tablet, Take 250 mg by mouth daily., Disp: , Rfl:    Objective There were no vitals filed for this visit.   Gen: NAD  CV: S1 S2 RRR Lungs: Clear to auscultation bilaterally Abd: soft, nontender   Previous Pelvic Exam showed: Pelvic Exam: Normal external female genitalia; Bartholin's and Skene's glands normal in appearance; urethral meatus normal in appearance, no urethral masses or discharge.    CST: negative   s/p hysterectomy: Speculum exam reveals normal vaginal mucosa with  atrophy and normal vaginal cuff.  Adnexa no mass, fullness, tenderness.     Pelvic floor strength II/V, puborectalis IV/V external anal sphincter IV/V   Pelvic floor musculature: Right levator non-tender, Right obturator non-tender, Left levator non-tender, Left obturator non-tender   POP-Q:    POP-Q   -1.5                                            Aa   -1.5                                           Ba   -5.5                                              C    4                                            Gh   4                                            Pb   7.5                                            tvl    0.5                                            Ap   0.5                                            Bp                                                  D          Rectal Exam:  Normal sphincter tone, moderate distal rectocele, enterocoele present, no rectal masses, no sign of dyssynergia when asking the patient to bear down.    Assessment/ Plan  Assessment: The patient is a 72 y.o. year old scheduled to undergo Exam under anesthesia, posterior repair,  perineorrhaphy, sacrospinous ligmament fixation, possible anterior  repair and cystoscopy. Verbal consent was obtained for these procedures.   Selmer Dominion, NP

## 2022-12-24 NOTE — Progress Notes (Signed)
[facility] Pre-Operative Exam  Subjective Chief Complaint: Samantha Booker presents for a preoperative encounter.   History of Present Illness: Samantha Booker is a 72 y.o. female who presents for preoperative visit.  She is scheduled to undergo Exam under anesthesia, posterior repair, perineorrhaphy, sacrospinous ligmament fixation, possible anterior repair and cystoscopy  on 01/12/23.  Her symptoms include Pelvic organ prolapse, and she was was found to have Stage I anterior, Stage II posterior, Stage I apical prolapse.   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 (HCC)    Hepatitis 1972   Hep B   History of basal cell carcinoma (BCC) 10/30/2021   right popliteal fossa BCC NODULAR PATTERN, BASE INVOLVED  treated with ED&C   History of hepatitis B    History of kidney stones    in past   Hyperlipidemia    Hypothyroidism    Jaundice    Thyroid disease      Past Surgical History:  Procedure Laterality Date   APPENDECTOMY  1979   BOTOX INJECTION N/A 09/24/2021   Procedure: BOTOX INJECTION;  Surgeon: Carolan Shiver, MD;  Location: [facility];  Service: General;  Laterality: N/A;   CATARACT EXTRACTION Bilateral 2019   CHOLECYSTECTOMY  2011   COLONOSCOPY WITH PROPOFOL N/A 05/20/2018   Procedure: COLONOSCOPY WITH PROPOFOL;  Surgeon: Toney Reil, MD;  Location: [facility];  Service: Gastroenterology;  Laterality: N/A;   ESOPHAGOGASTRODUODENOSCOPY (EGD) WITH PROPOFOL N/A 07/21/2019   Procedure:  ESOPHAGOGASTRODUODENOSCOPY (EGD) WITH PROPOFOL;  Surgeon: Toney Reil, MD;  Location: [facility];  Service: Endoscopy;  Laterality: N/A;   EYE SURGERY Left 10/05/2017   HEMORRHOID SURGERY N/A 09/24/2021   Procedure: HEMORRHOIDECTOMY;  Surgeon: Carolan Shiver, MD;  Location: [facility];  Service: General;  Laterality: N/A;   INGUINAL HERNIA REPAIR Left 12/15/2018   Procedure: LAPAROSCOPIC LEFT INGUINAL HERNIA VS OPEN;  Surgeon: Carolan Shiver, MD;  Location: [facility];  Service: General;  Laterality: Left;   LAPAROSCOPIC UNILATERAL SALPINGO OOPHERECTOMY Right 12/15/2018   Procedure: LAPAROSCOPIC UNILATERAL OOPHORECTOMY;  Surgeon: Ward, Elenora Fender, MD;  Location: [facility];  Service: Gynecology;  Laterality: Right;   ROOT CANAL     suburethral sling  2012   Dr. Flora Lipps   VAGINAL HYSTERECTOMY  1980   precancerous cells    is allergic to penicillins.   Family History  Problem Relation Age of Onset   Diabetes Mother    Heart disease Mother        CABG 2007   Hyperlipidemia Mother    COPD Mother    Hyperlipidemia Father    Heart disease Father    Cancer Maternal Grandmother        breast   Cancer Maternal Aunt        breast   Parkinson's disease Brother    Cancer Cousin        brain   Cancer Cousin        melanoma    Social History   Tobacco Use  Smoking status: Former    Current packs/day: 0.00    Average packs/day: 2.0 packs/day for 20.0 years (40.0 ttl pk-yrs)    Types: Cigarettes    Start date: 12/09/1971    Quit date: 12/09/1991    Years since quitting: 31.0   Smokeless tobacco: Never  Vaping Use   Vaping status: Never Used  Substance Use Topics   Alcohol use: Yes    Alcohol/week: 2.0 standard drinks of alcohol    Types: 2 Glasses of wine per week    Comment: socially   Drug use: No     Review of Systems was negative for a full 10 system review except as noted in the History of Present Illness.   Current Outpatient Medications:     acetaminophen (TYLENOL) 500 MG tablet, Take 1,000 mg by mouth every 6 (six) hours as needed (for pain.)., Disp: , Rfl:    acetaminophen-codeine (TYLENOL #3) 300-30 MG tablet, Take 1 tablet by mouth every 4 (four) hours as needed for moderate pain., Disp: 20 tablet, Rfl: 0   acyclovir cream (ZOVIRAX) 5 %, Apply 1 application topically every 4 (four) hours as needed (for cold sores). , Disp: , Rfl:    aspirin EC 81 MG tablet, Take 1 tablet (81 mg total) by mouth daily., Disp: , Rfl:    atorvastatin (LIPITOR) 40 MG tablet, Take 1 tablet (40 mg total) by mouth at bedtime. (Patient taking differently: Take 40 mg by mouth every morning.), Disp: 90 tablet, Rfl: 1   BIOTIN PO, Take 1 tablet by mouth daily., Disp: , Rfl:    celecoxib (CELEBREX) 200 MG capsule, Take 200 mg by mouth 2 (two) times daily., Disp: , Rfl:    cholecalciferol (VITAMIN D3) 25 MCG (1000 UT) tablet, Take 1,000 Units by mouth daily., Disp: , Rfl:    hydrocortisone (ANUSOL-HC) 2.5 % rectal cream, Place 1 application rectally 2 (two) times daily., Disp: 30 g, Rfl: 1   levothyroxine (SYNTHROID) 112 MCG tablet, Take 112 mcg by mouth daily before breakfast., Disp: , Rfl:    LORazepam (ATIVAN) 0.5 MG tablet, Take 0.5 mg by mouth 2 (two) times daily as needed., Disp: , Rfl:    Multiple Vitamins-Minerals (PRESERVISION AREDS PO), Take 1 tablet by mouth daily. , Disp: , Rfl:    pregabalin (LYRICA) 75 MG capsule, Take 75 mg by mouth 2 (two) times daily., Disp: , Rfl:    PREMARIN 1.25 MG tablet, Take 1.25 mg by mouth daily., Disp: , Rfl:    venlafaxine XR (EFFEXOR-XR) 75 MG 24 hr capsule, Take by mouth., Disp: , Rfl:    vitamin C (ASCORBIC ACID) 250 MG tablet, Take 250 mg by mouth daily., Disp: , Rfl:    Objective Vitals:   12/24/22 1046  BP: 133/75  Pulse: 69    Gen: NAD CV: S1 S2 RRR Lungs: Clear to auscultation bilaterally Abd: soft, nontender   Previous Pelvic Exam showed: Pelvic Exam: Normal external female genitalia; Bartholin's  and Skene's glands normal in appearance; urethral meatus normal in appearance, no urethral masses or discharge.    CST: negative   s/p hysterectomy: Speculum exam reveals normal vaginal mucosa with  atrophy and normal vaginal cuff.  Adnexa no mass, fullness, tenderness.     Pelvic floor strength II/V, puborectalis IV/V external anal sphincter IV/V   Pelvic floor musculature: Right levator non-tender, Right obturator non-tender, Left levator non-tender, Left obturator non-tender   POP-Q:    POP-Q   -1.5  Aa   -1.5                                           Ba   -5.5                                              C    4                                            Gh   4                                            Pb   7.5                                            tvl    0.5                                            Ap   0.5                                            Bp                                                  D          Rectal Exam:  Normal sphincter tone, moderate distal rectocele, enterocoele present, no rectal masses, no sign of dyssynergia when asking the patient to bear down.    Assessment/ Plan  Assessment: The patient is a 72 y.o. year old scheduled to undergo Exam under anesthesia, posterior repair, perineorrhaphy, sacrospinous ligmament fixation, possible anterior repair and cystoscopy. Verbal consent was obtained for these procedures.  Plan: General Surgical Consent: The patient has previously been counseled on alternative treatments, and the decision by the patient and provider was to proceed with the procedure listed above.  For all procedures, there are risks of bleeding, infection, damage to surrounding organs including but not limited to bowel, bladder, blood vessels, ureters and nerves, and need for further surgery if an injury were to occur. These risks are all low with minimally invasive surgery.    There are risks of numbness and weakness at any body site or buttock/rectal pain.  It is possible that baseline pain can be worsened by surgery, either with or without mesh. If surgery is vaginal, there is also a low risk of possible conversion to laparoscopy or open abdominal incision where indicated. Very rare risks include blood transfusion, blood clot, heart attack, pneumonia, or death.  There is also a risk of short-term postoperative urinary retention with need to use a catheter. About half of patients need to go home from surgery with a catheter, which is then later removed in the office. The risk of long-term need for a catheter is very low. There is also a risk of worsening of overactive bladder.   Prolapse (with or without mesh): Risk factors for surgical failure  include things that put pressure on your pelvis and the surgical repair, including obesity, chronic cough, and heavy lifting or straining (including lifting children or adults, straining on the toilet, or lifting heavy objects such as furniture or anything weighing >25 lbs. Risks of recurrence is 20-30% with vaginal native tissue repair and a less than 10% with sacrocolpopexy with mesh.     We discussed consent for blood products. Risks for blood transfusion include allergic reactions, other reactions that can affect different body organs and managed accordingly, transmission of infectious diseases such as HIV or Hepatitis. However, the blood is screened. Patient consents for blood products.  Pre-operative instructions:  She was instructed to not take Aspirin/NSAIDs x 7days prior to surgery. She may continue her 81mg  ASA. Antibiotic prophylaxis was ordered as indicated.  Catheter use: Patient will go home with foley if needed after post-operative voiding trial.  Post-operative instructions:  She was provided with specific post-operative instructions, including precautions and signs/symptoms for which we would recommend  contacting us, in addition to daytime and after-hours contact phone numbers. This was provided on a handout.   Post-operative medications: Prescriptions for motrin, tylenol, miralax, and tramadol were sent to her pharmacy. Discussed using ibuprofen and tylenol on a schedule to limit use of narcotics.   Laboratory testing:  We will check labs: As required by anesthesia.   Preoperative clearance:  She does not require surgical clearance.    Post-operative follow-up:  A post-operative appointment will be made for 6 weeks from the date of surgery. If she needs a post-operative nurse visit for a voiding trial, that will be set up after she leaves the hospital.    Patient will call the clinic or use MyChart should anything change or any new issues arise.   Selmer Dominion, NP

## 2023-01-01 ENCOUNTER — Encounter (HOSPITAL_BASED_OUTPATIENT_CLINIC_OR_DEPARTMENT_OTHER): Payer: Self-pay | Admitting: Obstetrics and Gynecology

## 2023-01-02 ENCOUNTER — Encounter (HOSPITAL_BASED_OUTPATIENT_CLINIC_OR_DEPARTMENT_OTHER): Payer: Self-pay | Admitting: Obstetrics and Gynecology

## 2023-01-02 ENCOUNTER — Other Ambulatory Visit: Payer: Self-pay

## 2023-01-02 NOTE — Progress Notes (Signed)
Spoke w/ via phone for pre-op interview---pt Lab needs dos----  none             Lab results------none COVID test -----patient states asymptomatic no test needed Arrive at -------530 am 01-12-2023 NPO after MN NO Solid Food.  Clear liquids from MN until---430 Med rec completed Medications to take morning of surgery -----atorvastatin, levothyroxine, pregagalin, premarin, venlafaxine Diabetic medication -----n/a Patient instructed no nail polish to be worn day of surgery Patient instructed to bring photo id and insurance card day of surgery Patient aware to have Driver (ride ) / caregiver    for 24 hours after surgery  daughter Marda Stalker and husband web Patient Special Instructions -----pt received no instructions on 81 mg asa from surgeon, last dose to be 01-11-2023 Pre-Op special Instructions -----n/a Patient verbalized understanding of instructions that were given at this phone interview. Patient denies shortness of breath, chest pain, fever, cough at this phone interview.

## 2023-01-11 NOTE — Anesthesia Preprocedure Evaluation (Signed)
Anesthesia Evaluation  Patient identified by MRN, date of birth, ID band Patient awake    Reviewed: Allergy & Precautions, NPO status , Patient's Chart, lab work & pertinent test results  History of Anesthesia Complications Negative for: history of anesthetic complications  Airway Mallampati: III  TM Distance: >3 FB Neck ROM: Full    Dental  (+) Dental Advisory Given, Teeth Intact   Pulmonary former smoker   Pulmonary exam normal        Cardiovascular negative cardio ROS Normal cardiovascular exam     Neuro/Psych negative neurological ROS  negative psych ROS   GI/Hepatic negative GI ROS,,,(+) Hepatitis -, B  Endo/Other  Hypothyroidism    Renal/GU negative Renal ROS     Musculoskeletal  (+) Arthritis ,    Abdominal   Peds  Hematology negative hematology ROS (+)   Anesthesia Other Findings   Reproductive/Obstetrics                             Anesthesia Physical Anesthesia Plan  ASA: 2  Anesthesia Plan: General   Post-op Pain Management: Tylenol PO (pre-op)*   Induction: Intravenous  PONV Risk Score and Plan: 3 and Treatment may vary due to age or medical condition, Ondansetron and Dexamethasone  Airway Management Planned: LMA  Additional Equipment: None  Intra-op Plan:   Post-operative Plan: Extubation in OR  Informed Consent: I have reviewed the patients History and Physical, chart, labs and discussed the procedure including the risks, benefits and alternatives for the proposed anesthesia with the patient or authorized representative who has indicated his/her understanding and acceptance.     Dental advisory given  Plan Discussed with: CRNA and Anesthesiologist  Anesthesia Plan Comments:         Anesthesia Quick Evaluation

## 2023-01-12 ENCOUNTER — Ambulatory Visit (HOSPITAL_BASED_OUTPATIENT_CLINIC_OR_DEPARTMENT_OTHER): Payer: PPO | Admitting: Anesthesiology

## 2023-01-12 ENCOUNTER — Other Ambulatory Visit: Payer: Self-pay

## 2023-01-12 ENCOUNTER — Encounter (HOSPITAL_BASED_OUTPATIENT_CLINIC_OR_DEPARTMENT_OTHER)
Admission: RE | Disposition: A | Discharge status: Home / Self Care | Payer: Self-pay | Source: Home / Self Care | Attending: Obstetrics and Gynecology

## 2023-01-12 ENCOUNTER — Ambulatory Visit (HOSPITAL_BASED_OUTPATIENT_CLINIC_OR_DEPARTMENT_OTHER): Payer: Self-pay | Admitting: Anesthesiology

## 2023-01-12 ENCOUNTER — Ambulatory Visit (HOSPITAL_BASED_OUTPATIENT_CLINIC_OR_DEPARTMENT_OTHER)
Admission: RE | Admit: 2023-01-12 | Discharge: 2023-01-12 | Disposition: A | Discharge status: Home / Self Care | Payer: PPO | Attending: Obstetrics and Gynecology | Admitting: Obstetrics and Gynecology

## 2023-01-12 ENCOUNTER — Encounter (HOSPITAL_BASED_OUTPATIENT_CLINIC_OR_DEPARTMENT_OTHER): Payer: Self-pay | Admitting: Obstetrics and Gynecology

## 2023-01-12 DIAGNOSIS — N8110 Cystocele, unspecified: Secondary | ICD-10-CM | POA: Diagnosis not present

## 2023-01-12 DIAGNOSIS — N813 Complete uterovaginal prolapse: Secondary | ICD-10-CM | POA: Diagnosis not present

## 2023-01-12 DIAGNOSIS — Z01818 Encounter for other preprocedural examination: Secondary | ICD-10-CM

## 2023-01-12 DIAGNOSIS — N819 Female genital prolapse, unspecified: Secondary | ICD-10-CM | POA: Diagnosis not present

## 2023-01-12 DIAGNOSIS — N993 Prolapse of vaginal vault after hysterectomy: Secondary | ICD-10-CM | POA: Insufficient documentation

## 2023-01-12 DIAGNOSIS — N815 Vaginal enterocele: Secondary | ICD-10-CM | POA: Diagnosis not present

## 2023-01-12 DIAGNOSIS — N816 Rectocele: Secondary | ICD-10-CM | POA: Diagnosis not present

## 2023-01-12 HISTORY — PX: PERINEOPLASTY: SHX2218

## 2023-01-12 HISTORY — PX: CYSTOSCOPY: SHX5120

## 2023-01-12 HISTORY — DX: Pneumonia, unspecified organism: J18.9

## 2023-01-12 HISTORY — PX: ANTERIOR AND POSTERIOR REPAIR WITH SACROSPINOUS FIXATION: SHX6536

## 2023-01-12 SURGERY — ANTERIOR AND POSTERIOR REPAIR WITH SACROSPINOUS FIXATION
Anesthesia: General | Site: Vagina

## 2023-01-12 MED ORDER — KETOROLAC TROMETHAMINE 30 MG/ML IJ SOLN
INTRAMUSCULAR | Status: DC | PRN
Start: 2023-01-12 — End: 2023-01-12
  Administered 2023-01-12: 30 mg via INTRAVENOUS

## 2023-01-12 MED ORDER — PROPOFOL 10 MG/ML IV BOLUS
INTRAVENOUS | Status: DC | PRN
  Administered 2023-01-12: 130 mg via INTRAVENOUS

## 2023-01-12 MED ORDER — ONDANSETRON HCL 4 MG/2ML IJ SOLN: 4.0000 mg | Freq: Once | INTRAMUSCULAR | Status: DC | PRN

## 2023-01-12 MED ORDER — PROPOFOL 10 MG/ML IV BOLUS
INTRAVENOUS | Status: AC
  Filled 2023-01-12: qty 20

## 2023-01-12 MED ORDER — FENTANYL CITRATE (PF) 100 MCG/2ML IJ SOLN
INTRAMUSCULAR | Status: DC | PRN
  Administered 2023-01-12: 25 ug via INTRAVENOUS
  Administered 2023-01-12: 50 ug via INTRAVENOUS

## 2023-01-12 MED ORDER — CIPROFLOXACIN IN D5W 400 MG/200ML IV SOLN
INTRAVENOUS | Status: AC
  Filled 2023-01-12: qty 200

## 2023-01-12 MED ORDER — PHENAZOPYRIDINE HCL 100 MG PO TABS
200.0000 mg | ORAL_TABLET | ORAL | Status: AC
  Administered 2023-01-12: 200 mg via ORAL

## 2023-01-12 MED ORDER — OXYCODONE HCL 5 MG PO TABS: 5.0000 mg | ORAL_TABLET | Freq: Once | ORAL | Status: DC | PRN

## 2023-01-12 MED ORDER — ONDANSETRON HCL 4 MG/2ML IJ SOLN
INTRAMUSCULAR | Status: AC
  Filled 2023-01-12: qty 2

## 2023-01-12 MED ORDER — PHENAZOPYRIDINE HCL 100 MG PO TABS
ORAL_TABLET | ORAL | Status: AC
  Filled 2023-01-12: qty 2

## 2023-01-12 MED ORDER — GABAPENTIN 300 MG PO CAPS
300.0000 mg | ORAL_CAPSULE | ORAL | Status: AC
  Administered 2023-01-12: 300 mg via ORAL

## 2023-01-12 MED ORDER — EPHEDRINE 5 MG/ML INJ
INTRAVENOUS | Status: AC
  Filled 2023-01-12: qty 5

## 2023-01-12 MED ORDER — 0.9 % SODIUM CHLORIDE (POUR BTL) OPTIME
TOPICAL | Status: DC | PRN
  Administered 2023-01-12: 1000 mL

## 2023-01-12 MED ORDER — ONDANSETRON HCL 4 MG/2ML IJ SOLN
INTRAMUSCULAR | Status: DC | PRN
  Administered 2023-01-12: 4 mg via INTRAVENOUS

## 2023-01-12 MED ORDER — FENTANYL CITRATE (PF) 100 MCG/2ML IJ SOLN
INTRAMUSCULAR | Status: AC
  Filled 2023-01-12: qty 2

## 2023-01-12 MED ORDER — LIDOCAINE HCL (PF) 2 % IJ SOLN
INTRAMUSCULAR | Status: AC
  Filled 2023-01-12: qty 5

## 2023-01-12 MED ORDER — DEXAMETHASONE SODIUM PHOSPHATE 10 MG/ML IJ SOLN
INTRAMUSCULAR | Status: DC | PRN
  Administered 2023-01-12: 5 mg via INTRAVENOUS

## 2023-01-12 MED ORDER — METRONIDAZOLE 500 MG/100ML IV SOLN
INTRAVENOUS | Status: AC
  Filled 2023-01-12: qty 100

## 2023-01-12 MED ORDER — DEXAMETHASONE SODIUM PHOSPHATE 10 MG/ML IJ SOLN
INTRAMUSCULAR | Status: AC
  Filled 2023-01-12: qty 1

## 2023-01-12 MED ORDER — SODIUM CHLORIDE 0.9 % IR SOLN
Status: DC | PRN
  Administered 2023-01-12: 1000 mL

## 2023-01-12 MED ORDER — ACETAMINOPHEN 500 MG PO TABS
ORAL_TABLET | ORAL | Status: AC
  Filled 2023-01-12: qty 2

## 2023-01-12 MED ORDER — EPHEDRINE SULFATE-NACL 50-0.9 MG/10ML-% IV SOSY
PREFILLED_SYRINGE | INTRAVENOUS | Status: DC | PRN
  Administered 2023-01-12 (×3): 10 mg via INTRAVENOUS

## 2023-01-12 MED ORDER — LACTATED RINGERS IV SOLN: INTRAVENOUS | Status: DC

## 2023-01-12 MED ORDER — GABAPENTIN 300 MG PO CAPS
ORAL_CAPSULE | ORAL | Status: AC
  Filled 2023-01-12: qty 1

## 2023-01-12 MED ORDER — METRONIDAZOLE 500 MG/100ML IV SOLN
500.0000 mg | INTRAVENOUS | Status: AC
  Administered 2023-01-12: 500 mg via INTRAVENOUS

## 2023-01-12 MED ORDER — OXYCODONE HCL 5 MG/5ML PO SOLN: 5.0000 mg | Freq: Once | ORAL | Status: DC | PRN

## 2023-01-12 MED ORDER — KETOROLAC TROMETHAMINE 30 MG/ML IJ SOLN
INTRAMUSCULAR | Status: AC
  Filled 2023-01-12: qty 1

## 2023-01-12 MED ORDER — FENTANYL CITRATE (PF) 100 MCG/2ML IJ SOLN: 25.0000 ug | INTRAMUSCULAR | Status: DC | PRN

## 2023-01-12 MED ORDER — LIDOCAINE-EPINEPHRINE 1 %-1:100000 IJ SOLN
INTRAMUSCULAR | Status: DC | PRN
  Administered 2023-01-12: 13 mL
  Administered 2023-01-12: 20 mL

## 2023-01-12 MED ORDER — CIPROFLOXACIN IN D5W 400 MG/200ML IV SOLN
400.0000 mg | INTRAVENOUS | Status: AC
  Administered 2023-01-12: 400 mg via INTRAVENOUS

## 2023-01-12 MED ORDER — ACETAMINOPHEN 500 MG PO TABS
1000.0000 mg | ORAL_TABLET | ORAL | Status: AC
  Administered 2023-01-12: 1000 mg via ORAL

## 2023-01-12 MED ORDER — LIDOCAINE 2% (20 MG/ML) 5 ML SYRINGE
INTRAMUSCULAR | Status: DC | PRN
  Administered 2023-01-12: 60 mg via INTRAVENOUS

## 2023-01-12 SURGICAL SUPPLY — 33 items
BLADE CLIPPER SENSICLIP SURGIC (BLADE) IMPLANT
BLADE SURG 15 STRL LF DISP TIS (BLADE) ×3 IMPLANT
BLADE SURG 15 STRL SS (BLADE) ×3
DEVICE CAPIO SLIM SINGLE (INSTRUMENTS) IMPLANT
DRAPE HYSTEROSCOPY (MISCELLANEOUS) IMPLANT
GLOVE BIOGEL PI IND STRL 6.5 (GLOVE) ×3 IMPLANT
GLOVE BIOGEL PI IND STRL 7.0 (GLOVE) ×3 IMPLANT
GLOVE ECLIPSE 6.0 STRL STRAW (GLOVE) ×3 IMPLANT
GOWN STRL REUS W/TWL LRG LVL3 (GOWN DISPOSABLE) ×3 IMPLANT
HOLDER FOLEY CATH W/STRAP (MISCELLANEOUS) ×3 IMPLANT
IV NS 1000ML (IV SOLUTION) ×3
IV NS 1000ML BAXH (IV SOLUTION) IMPLANT
KIT TURNOVER CYSTO (KITS) ×3 IMPLANT
NDL HYPO 22X1.5 SAFETY MO (MISCELLANEOUS) ×3 IMPLANT
NDL MAYO 6 CRC TAPER PT (NEEDLE) IMPLANT
NEEDLE HYPO 22X1.5 SAFETY MO (MISCELLANEOUS) ×3 IMPLANT
NEEDLE MAYO 6 CRC TAPER PT (NEEDLE) ×3 IMPLANT
NS IRRIG 1000ML POUR BTL (IV SOLUTION) IMPLANT
PACK VAGINAL WOMENS (CUSTOM PROCEDURE TRAY) ×3 IMPLANT
PAD OB MATERNITY 4.3X12.25 (PERSONAL CARE ITEMS) ×3 IMPLANT
RETRACTOR LONE STAR DISPOSABLE (INSTRUMENTS) ×3 IMPLANT
RETRACTOR STAY HOOK 5MM (MISCELLANEOUS) ×3 IMPLANT
SCRUB CHG 4% DYNA-HEX 4OZ (MISCELLANEOUS) ×3 IMPLANT
SET IRRIG Y TYPE TUR BLADDER L (SET/KITS/TRAYS/PACK) ×3 IMPLANT
SLEEVE SCD COMPRESS KNEE MED (STOCKING) ×3 IMPLANT
SPIKE FLUID TRANSFER (MISCELLANEOUS) IMPLANT
SUT ABS MONO DBL WITH NDL 48IN (SUTURE) IMPLANT
SUT VIC AB 0 CT1 27 (SUTURE) ×3
SUT VIC AB 0 CT1 27XBRD ANBCTR (SUTURE) IMPLANT
SUT VICRYL 2-0 SH 8X27 (SUTURE) ×3 IMPLANT
SYR BULB EAR ULCER 3OZ GRN STR (SYRINGE) ×3 IMPLANT
TOWEL OR 17X24 6PK STRL BLUE (TOWEL DISPOSABLE) ×3 IMPLANT
TRAY FOLEY W/BAG SLVR 14FR LF (SET/KITS/TRAYS/PACK) ×3 IMPLANT

## 2023-01-12 NOTE — Anesthesia Postprocedure Evaluation (Signed)
Anesthesia Post Note  Patient: Samantha Booker  Procedure(s) Performed: ANTERIOR AND POSTERIOR REPAIR WITH SACROSPINOUS FIXATION (Vagina ) CYSTOSCOPY (Urethra) REPAIR OF ENTEROCELE PERINEOPLASTY (Vagina )     Patient location during evaluation: PACU Anesthesia Type: General Level of consciousness: awake and alert Pain management: pain level controlled Vital Signs Assessment: post-procedure vital signs reviewed and stable Respiratory status: spontaneous breathing, nonlabored ventilation and respiratory function stable Cardiovascular status: stable and blood pressure returned to baseline Anesthetic complications: no   No notable events documented.  Last Vitals:  Vitals:   01/12/23 1045 01/12/23 1100  BP: 126/70 114/70  Pulse: 77 84  Resp: 10 15  Temp:  36.6 C  SpO2: 95% 94%    Last Pain:  Vitals:   01/12/23 1030  TempSrc:   PainSc: 0-No pain                 Beryle Lathe

## 2023-01-12 NOTE — Discharge Instructions (Addendum)

## 2023-01-12 NOTE — Op Note (Signed)
Operative Note  Preoperative Diagnosis: anterior vaginal prolapse, posterior vaginal prolapse, and enterocele  Postoperative Diagnosis: same  Procedures performed:  Anterior and posterior repair, enterocele repair, sacrospinous ligament fixation, perineorrhaphy, cystoscopy  Implants: none  Attending Surgeon: Lanetta Inch, MD  Anesthesia: General LMA  Findings: 1. On vaginal exam, stage III posterior predominant prolapse noted  2. On cystoscopy, normal urethra and bladder mucosa without injury, lesion or foreign body. Brisk bilateral ureteral efflux noted.    Specimens: none  Estimated blood loss: 75 mL  IV fluids: 600 mL  Urine output: 350 mL  Complications: none  Procedure in Detail:  After informed consent was obtained, the patient was taken to the operating room where anesthesia was induced and found to be adequate. She was placed in dorsal lithotomy position, taking care to avoid any traction on the extremities, and then prepped and draped in the usual sterile fashion. A self-retaining lonestar retractor was placed using four elastic blue stays.  After a foley catheter was inserted into the urethra, the location of the midurethra was palpated. Two Allis clamps were in the midline of the posterior vaginal wall defect.  1% lidocaine with epinephrine was injected into the vaginal mucosa. A vertical incision was made between these clamps with a 15 blade scalpel.  The rectovaginal septum was then dissected off the vaginal mucosa bilaterally.  A large enterocele was noted. The enterocele was reduced with serial pursestring 2-0 vicryl sutures. The rectovaginal septum was then plicated with sutures of 2-0 Vicryl.    For the sacrospinous ligament fixation (SSLF), the ischial spine was accessed on the right side via dissection with Metzenbaum scissors and blunt dissection.  The sacrospinous ligament was palpated. Two 0 PDS suture was then placed at the sacrospinous ligament two  fingerbreadths medial to the ischial spine, in order to avoid the pudendal neurovascular bundle, using a Capio needle driver.  The PDS suture was attached to the vaginal epithelium on the ipsilateral side of the vaginal apex and held. The vaginal mucosal edges were trimmed and the incision reapproximated with 2-0 Vicryl in a running fashion. The SSLF suture was then tied down with excellent support of the apical vagina.  Two Allis clamps were along the anterior vaginal wall defect. 1% lidocaine with epinephrine was injected into the vaginal mucosa.  A vertical incision was made between these two Allis clamps with a 15 blade scalpel.  Allis clamps were placed along this incision and Metzenbaum scissors were used to undermine the vaginal mucosa along the incision.  The vaginal mucosa was then sharply dissected off to the vesicovaginal septum bilaterally to the level of the pubic rami.  Anterior plication of the vesicovaginal septum was then performed using plicating sutures of 2-0 Vicryl.    The Foley catheter was removed.  A 70-degree cystoscope was introduced, and 360-degree inspection revealed normal bladder mucosa, no trauma to the bladder, with bilateral ureteral efflux.  The bladder was drained and the cystoscope was removed.  The Foley catheter was reinserted.   Attention was then turned to the perineum. Two allis clamps were placed at the introitus. The perineum was injected with 1% lidocaine with epinephrine. A diamond shaped incision was made over the perineum and excess skin was removed. Dissection was performed with Metzenbaum scissors to separate the mucosa from the underlying tissue. The perineal body was then reapproximated with two interrupted 0-vicryl sutures. The perineal skin was then closed with a 2-0 vicryl in a subcutaneous and subcuticular fashion. Irrigation was performed and good  hemostasis was noted.  Vaginal packing was not placed.  A rectal examination was normal and confirmed no  sutures within the rectum.  The patient tolerated the procedure well.  She was awakened from anesthesia and transferred to the recovery room in stable condition. All counts were correct x 2.    Marguerita Beards, MD

## 2023-01-12 NOTE — Interval H&P Note (Signed)
**Note Samantha-Identified via Obfuscation** History and Physical Interval Note:  01/12/2023 7:14 AM  Samantha Booker  has presented today for surgery, with the diagnosis of posterior vaginal prolapse; prolapse of vaginal vault after hysterectomy.  The various methods of treatment have been discussed with the patient and family. After consideration of risks, benefits and other options for treatment, the patient has consented to  Procedure(s) with comments: ANTERIOR (POSSIBLE)  AND POSTERIOR REPAIR WITH SACROSPINOUS FIXATION (N/A)  CYSTOSCOPY as a surgical intervention.  The patient's history has been reviewed, patient examined, no change in status, stable for surgery.  I have reviewed the patient's chart and labs.  Questions were answered to the patient's satisfaction.     Marguerita Beards

## 2023-01-12 NOTE — Anesthesia Procedure Notes (Signed)
Procedure Name: LMA Insertion Date/Time: 01/12/2023 7:36 AM  Performed by: Francie Massing, CRNAPre-anesthesia Checklist: Patient identified, Emergency Drugs available, Suction available and Patient being monitored Patient Re-evaluated:Patient Re-evaluated prior to induction Oxygen Delivery Method: Circle system utilized Preoxygenation: Pre-oxygenation with 100% oxygen Induction Type: IV induction Ventilation: Mask ventilation without difficulty LMA: LMA inserted LMA Size: 3.0 Number of attempts: 2 Airway Equipment and Method: Bite block Placement Confirmation: positive ETCO2 Tube secured with: Tape Dental Injury: Teeth and Oropharynx as per pre-operative assessment  Comments: Poor seal with LMA 4, exchanged with LMA #3  - good seal , bilateral breath sounds positive and equal

## 2023-01-12 NOTE — Transfer of Care (Signed)
Immediate Anesthesia Transfer of Care Note  Patient: Samantha Booker  Procedure(s) Performed: Procedure(s) (LRB): ANTERIOR AND POSTERIOR REPAIR WITH SACROSPINOUS FIXATION (N/A) CYSTOSCOPY REPAIR OF ENTEROCELE (N/A) PERINEOPLASTY (N/A)  Patient Location: PACU  Anesthesia Type: General  Level of Consciousness: awake, oriented, sedated and patient cooperative  Airway & Oxygen Therapy: Patient Spontanous Breathing and Patient connected to face mask oxygen  Post-op Assessment: Report given to PACU RN and Post -op Vital signs reviewed and stable  Post vital signs: Reviewed and stable  Complications: No apparent anesthesia complications Last Vitals:  Vitals Value Taken Time  BP 165/77 01/12/23 0918  Temp    Pulse 86 01/12/23 0918  Resp 10 01/12/23 0918  SpO2 96 % 01/12/23 0918    Last Pain:  Vitals:   01/12/23 0614  TempSrc: Oral  PainSc: 0-No pain      Patients Stated Pain Goal: 8 (01/12/23 1610)  Complications: No notable events documented.

## 2023-01-13 ENCOUNTER — Encounter (HOSPITAL_BASED_OUTPATIENT_CLINIC_OR_DEPARTMENT_OTHER): Payer: Self-pay | Admitting: Obstetrics and Gynecology

## 2023-01-13 ENCOUNTER — Telehealth: Payer: Self-pay | Admitting: Obstetrics and Gynecology

## 2023-01-13 NOTE — Telephone Encounter (Signed)
De Nurse underwent Anterior and posterior repair, enterocele repair, sacrospinous ligament fixation, perineorrhaphy, cystoscopy on 09/167/24.   She passed her voiding trial.  was backfilled into the bladder Voided  PVR by bladder scan was 0ml.   She was discharged without a catheter. Please call her for a routine post op check. Thanks!  Marguerita Beards, MD

## 2023-01-15 NOTE — Telephone Encounter (Signed)
Called and spoke to patient: Pain is reported to be low and mostly a soreness. She has stopped all pain medication  She reports she has some blood up until yesterday but only small amounts.  Reports she is able to urinate normally Also reports she has been able to have a bowel movement without issue.

## 2023-02-10 ENCOUNTER — Other Ambulatory Visit: Payer: Self-pay | Admitting: Orthopedic Surgery

## 2023-02-10 DIAGNOSIS — M1711 Unilateral primary osteoarthritis, right knee: Secondary | ICD-10-CM | POA: Diagnosis not present

## 2023-02-10 DIAGNOSIS — M75101 Unspecified rotator cuff tear or rupture of right shoulder, not specified as traumatic: Secondary | ICD-10-CM

## 2023-02-10 DIAGNOSIS — M19011 Primary osteoarthritis, right shoulder: Secondary | ICD-10-CM | POA: Diagnosis not present

## 2023-02-24 ENCOUNTER — Ambulatory Visit (INDEPENDENT_AMBULATORY_CARE_PROVIDER_SITE_OTHER): Payer: PPO | Admitting: Obstetrics and Gynecology

## 2023-02-24 ENCOUNTER — Encounter: Payer: Self-pay | Admitting: Obstetrics and Gynecology

## 2023-02-24 VITALS — BP 163/100 | HR 66

## 2023-02-24 DIAGNOSIS — Z48816 Encounter for surgical aftercare following surgery on the genitourinary system: Secondary | ICD-10-CM

## 2023-02-24 DIAGNOSIS — Z9889 Other specified postprocedural states: Secondary | ICD-10-CM

## 2023-02-24 MED ORDER — ESTRADIOL 0.1 MG/GM VA CREA: TOPICAL_CREAM | VAGINAL | 11 refills | Status: DC

## 2023-02-24 NOTE — Progress Notes (Signed)
[facility]  Date of Visit: 02/24/2023  History of Present Illness: Samantha Booker is a 72 y.o. female scheduled today for a post-operative visit.   Surgery: s/p Anterior and posterior repair, enterocele repair, sacrospinous ligament fixation, perineorrhaphy, cystoscopy on 01/12/23  She passed her postoperative void trial.   Postoperative course has  been uncomplicated.   Today she reports she is having a lot more urgency and leakage since the surgery. Leakage is not as bad. Drinks 2-3 cups coffee in AM, decaf tea at night, 3-4 glasses water during the day.   UTI in the last 6 weeks? No  Pain? No  She has returned to her normal activity (except for postop restrictions) Vaginal bulge? No  Stress incontinence: No  Urgency/frequency: Yes  Urge incontinence: Yes  Voiding dysfunction: No  Bowel issues: No   Subjective Success: Do you usually have a bulge or something falling out that you can see or feel in the vaginal area? No  Retreatment Success: Any retreatment with surgery or pessary for any compartment? No    Medications: She has a current medication list which includes the following prescription(s): acetaminophen, acyclovir cream, aspirin ec, atorvastatin, biotin, celecoxib, cholecalciferol, [START ON 02/26/2023] estradiol, hydrocortisone, ibuprofen, levothyroxine, lorazepam, pregabalin, premarin, and venlafaxine xr.   Allergies: Patient is allergic to penicillins.   Physical Exam: BP (!) 163/100   Pulse 66    Pelvic Examination: Sutures present at the introitus. Vagina: Incisions healing well. Sutures are present at incision line and there is not granulation tissue. No tenderness along the anterior or posterior vagina. No apical tenderness. No pelvic masses.   POP-Q: POP-Q  -3                                            Aa   -3                                           Ba  -6.5                                              C   3                                             Gh  5                                            Pb  7                                            tvl   -3  Ap  -3                                            Bp                                                 D     ---------------------------------------------------------  Assessment and Plan:  1. Post-operative state     - start vaginal estrace cream 0.5g nightly for two weeks then twice a week after. Will recheck suture lines in 1 month.  - For bladder urgency, we discussed options of waiting, pelvic PT or medication. She wants to wait another month to see if symptoms improve. Recommend decreasing caffeine intake.  - Can resume regular activity including exercise. Wait until after next post op visit before resuming intercourse.  - Discussed avoidance of heavy lifting and straining long term to reduce the risk of recurrence.   All questions answered.   Return in about 1 month (around 03/27/2023).  Marguerita Beards, MD

## 2023-02-25 ENCOUNTER — Ambulatory Visit
Admission: RE | Admit: 2023-02-25 | Discharge: 2023-02-25 | Disposition: A | Discharge status: Home / Self Care | Payer: PPO | Source: Ambulatory Visit | Attending: Orthopedic Surgery | Admitting: Orthopedic Surgery

## 2023-02-25 DIAGNOSIS — M7581 Other shoulder lesions, right shoulder: Secondary | ICD-10-CM | POA: Diagnosis not present

## 2023-02-25 DIAGNOSIS — M25511 Pain in right shoulder: Secondary | ICD-10-CM | POA: Diagnosis not present

## 2023-02-25 DIAGNOSIS — M19011 Primary osteoarthritis, right shoulder: Secondary | ICD-10-CM

## 2023-02-25 DIAGNOSIS — M75101 Unspecified rotator cuff tear or rupture of right shoulder, not specified as traumatic: Secondary | ICD-10-CM

## 2023-03-02 DIAGNOSIS — R8290 Unspecified abnormal findings in urine: Secondary | ICD-10-CM | POA: Diagnosis not present

## 2023-03-02 DIAGNOSIS — E039 Hypothyroidism, unspecified: Secondary | ICD-10-CM | POA: Diagnosis not present

## 2023-03-02 DIAGNOSIS — Z79899 Other long term (current) drug therapy: Secondary | ICD-10-CM | POA: Diagnosis not present

## 2023-03-02 DIAGNOSIS — E782 Mixed hyperlipidemia: Secondary | ICD-10-CM | POA: Diagnosis not present

## 2023-03-02 DIAGNOSIS — R739 Hyperglycemia, unspecified: Secondary | ICD-10-CM | POA: Diagnosis not present

## 2023-03-02 DIAGNOSIS — I10 Essential (primary) hypertension: Secondary | ICD-10-CM | POA: Diagnosis not present

## 2023-03-09 DIAGNOSIS — Z1231 Encounter for screening mammogram for malignant neoplasm of breast: Secondary | ICD-10-CM | POA: Diagnosis not present

## 2023-03-09 DIAGNOSIS — I10 Essential (primary) hypertension: Secondary | ICD-10-CM | POA: Diagnosis not present

## 2023-03-09 DIAGNOSIS — Z0000 Encounter for general adult medical examination without abnormal findings: Secondary | ICD-10-CM | POA: Diagnosis not present

## 2023-03-09 DIAGNOSIS — E782 Mixed hyperlipidemia: Secondary | ICD-10-CM | POA: Diagnosis not present

## 2023-03-09 DIAGNOSIS — Z79899 Other long term (current) drug therapy: Secondary | ICD-10-CM | POA: Diagnosis not present

## 2023-03-09 DIAGNOSIS — Z23 Encounter for immunization: Secondary | ICD-10-CM | POA: Diagnosis not present

## 2023-03-09 DIAGNOSIS — R7303 Prediabetes: Secondary | ICD-10-CM | POA: Diagnosis not present

## 2023-03-09 DIAGNOSIS — E039 Hypothyroidism, unspecified: Secondary | ICD-10-CM | POA: Diagnosis not present

## 2023-03-31 ENCOUNTER — Ambulatory Visit (INDEPENDENT_AMBULATORY_CARE_PROVIDER_SITE_OTHER): Payer: PPO | Admitting: Obstetrics and Gynecology

## 2023-03-31 ENCOUNTER — Encounter: Payer: Self-pay | Admitting: Obstetrics and Gynecology

## 2023-03-31 VITALS — BP 150/87 | HR 70

## 2023-03-31 DIAGNOSIS — Z48816 Encounter for surgical aftercare following surgery on the genitourinary system: Secondary | ICD-10-CM

## 2023-03-31 DIAGNOSIS — Z9889 Other specified postprocedural states: Secondary | ICD-10-CM

## 2023-03-31 NOTE — Progress Notes (Signed)
[facility]  Date of Visit: 03/31/2023  History of Present Illness: Samantha Booker is a 72 y.o. female scheduled today for a post-operative visit.   Surgery: s/p Anterior and posterior repair, enterocele repair, sacrospinous ligament fixation, perineorrhaphy, cystoscopy on 01/12/23   Has been using vaginal estrogen cream, now twice a week. Discharge has improved.  She has cut back on the caffeine and that has improved her urgency and leakage.    Medications: She has a current medication list which includes the following prescription(s): acetaminophen, acyclovir cream, aspirin ec, atorvastatin, biotin, celecoxib, cholecalciferol, estradiol, hydrocortisone, ibuprofen, levothyroxine, lorazepam, pregabalin, premarin, and venlafaxine xr.   Allergies: Patient is allergic to penicillins.   Physical Exam: BP (!) 150/87   Pulse 70    Pelvic Examination: well healed at introitus. Suture lines on speculum well healed. Sutures present at apex, no erythema    ---------------------------------------------------------  Assessment and Plan:  1. Post-operative state      - continue estrace twice a week - Will monitor urgency symptoms, have improved. ke.  - Ok to resume activity including intercourse. We discussed that sutures at the vaginal apex will dissolve over time.   Return as needed  Samantha Beards, MD

## 2023-05-20 ENCOUNTER — Ambulatory Visit: Payer: PPO | Admitting: Dermatology

## 2023-05-25 ENCOUNTER — Ambulatory Visit: Payer: PPO | Admitting: Dermatology

## 2023-05-26 ENCOUNTER — Ambulatory Visit: Payer: PPO | Admitting: Dermatology

## 2023-05-26 DIAGNOSIS — D2371 Other benign neoplasm of skin of right lower limb, including hip: Secondary | ICD-10-CM

## 2023-05-26 DIAGNOSIS — I781 Nevus, non-neoplastic: Secondary | ICD-10-CM | POA: Diagnosis not present

## 2023-05-26 DIAGNOSIS — D229 Melanocytic nevi, unspecified: Secondary | ICD-10-CM

## 2023-05-26 DIAGNOSIS — L820 Inflamed seborrheic keratosis: Secondary | ICD-10-CM | POA: Diagnosis not present

## 2023-05-26 DIAGNOSIS — L578 Other skin changes due to chronic exposure to nonionizing radiation: Secondary | ICD-10-CM | POA: Diagnosis not present

## 2023-05-26 DIAGNOSIS — L814 Other melanin hyperpigmentation: Secondary | ICD-10-CM | POA: Diagnosis not present

## 2023-05-26 DIAGNOSIS — W908XXA Exposure to other nonionizing radiation, initial encounter: Secondary | ICD-10-CM | POA: Diagnosis not present

## 2023-05-26 DIAGNOSIS — Z1283 Encounter for screening for malignant neoplasm of skin: Secondary | ICD-10-CM

## 2023-05-26 DIAGNOSIS — Z85828 Personal history of other malignant neoplasm of skin: Secondary | ICD-10-CM

## 2023-05-26 DIAGNOSIS — D235 Other benign neoplasm of skin of trunk: Secondary | ICD-10-CM | POA: Diagnosis not present

## 2023-05-26 DIAGNOSIS — D239 Other benign neoplasm of skin, unspecified: Secondary | ICD-10-CM

## 2023-05-26 DIAGNOSIS — L719 Rosacea, unspecified: Secondary | ICD-10-CM | POA: Diagnosis not present

## 2023-05-26 DIAGNOSIS — L649 Androgenic alopecia, unspecified: Secondary | ICD-10-CM

## 2023-05-26 DIAGNOSIS — L821 Other seborrheic keratosis: Secondary | ICD-10-CM | POA: Diagnosis not present

## 2023-05-26 DIAGNOSIS — D1801 Hemangioma of skin and subcutaneous tissue: Secondary | ICD-10-CM | POA: Diagnosis not present

## 2023-05-26 DIAGNOSIS — L853 Xerosis cutis: Secondary | ICD-10-CM

## 2023-05-26 NOTE — Progress Notes (Signed)
Follow-Up Visit   Subjective  Samantha Booker is a 73 y.o. female who presents for the following: Skin Cancer Screening and Full Body Skin Exam Hx of bcc, hx of aks Patient also reports itchy spot on L arm,  hair thinning scalp   The patient presents for Total-Body Skin Exam (TBSE) for skin cancer screening and mole check. The patient has spots, moles and lesions to be evaluated, some may be new or changing and the patient may have concern these could be cancer.    The following portions of the chart were reviewed this encounter and updated as appropriate: medications, allergies, medical history  Review of Systems:  No other skin or systemic complaints except as noted in HPI or Assessment and Plan.  Objective  Well appearing patient in no apparent distress; mood and affect are within normal limits.  A full examination was performed including scalp, head, eyes, ears, nose, lips, neck, chest, axillae, abdomen, back, buttocks, bilateral upper extremities, bilateral lower extremities, hands, feet, fingers, toes, fingernails, and toenails. All findings within normal limits unless otherwise noted below.   Relevant physical exam findings are noted in the Assessment and Plan.  left forearm x 1 Erythematous stuck-on, waxy papule or plaque  Assessment & Plan   SKIN CANCER SCREENING PERFORMED TODAY.  ACTINIC DAMAGE - Chronic condition, secondary to cumulative UV/sun exposure - diffuse scaly erythematous macules with underlying dyspigmentation - Recommend daily broad spectrum sunscreen SPF 30+ to sun-exposed areas, reapply every 2 hours as needed.  - Staying in the shade or wearing long sleeves, sun glasses (UVA+UVB protection) and wide brim hats (4-inch brim around the entire circumference of the hat) are also recommended for sun protection.  - Call for new or changing lesions.  LENTIGINES, SEBORRHEIC KERATOSES, HEMANGIOMAS - Benign normal skin lesions - Benign-appearing - Call for  any changes  Telangiectasia R upper nasal dorsum  Exam: 3 mm Blanching pink/red macule   Plan:  Benign. Observe, Stable when compared to prior photo Has been treated with BBL, recommend another treatment for better result  Counseling for BBL / IPL / Laser and Coordination of Care Discussed the treatment option of Broad Band Light (BBL) /Intense Pulsed Light (IPL)/ Laser for skin discoloration, including brown spots and redness.  Typically we recommend at least 1-3 treatment sessions about 5-8 weeks apart for best results.  Cannot have tanned skin when BBL performed, and regular use of sunscreen/photoprotection is advised after the procedure to help maintain results. The patient's condition may also require "maintenance treatments" in the future.  The fee for BBL / laser treatments is $350 per treatment session for the whole face.  A fee can be quoted for other parts of the body.  Insurance typically does not pay for BBL/laser treatments and therefore the fee is an out-of-pocket cost. Recommend prophylactic valtrex treatment. Once scheduled for procedure, will send Rx in prior to patient's appointment.     DERMATOFIBROMA at left upper back and right pretibial  Exam: Firm pink/brown papulenodule with dimple sign.  Treatment Plan: A dermatofibroma is a benign growth possibly related to trauma, such as an insect bite, cut from shaving, or inflamed acne-type bump.  Treatment options to remove include shave or excision with resulting scar and risk of recurrence.  Since benign-appearing and not bothersome, will observe for now.    MELANOCYTIC NEVI - Tan-brown and/or pink-flesh-colored symmetric macules and papules - Benign appearing on exam today - Observation - Call clinic for new or changing moles -  Recommend daily use of broad spectrum spf 30+ sunscreen to sun-exposed areas.    Xerosis - diffuse xerotic patches - recommend gentle, hydrating skin care - gentle skin care handout  given   Rosacea face Exam : mild erythema with telangectasia at malar cheeks  Chronic and persistent condition with duration or expected duration over one year. Condition is improving with treatment but not currently at goal.    Counseling for BBL / IPL / Laser and Coordination of Care Discussed the treatment option of Broad Band Light (BBL) Intense Pulsed Light (IPL) / Laser.  Typically we recommend at least 1-3 treatment sessions about 5-8 weeks apart for best results.  The patient's condition may require "maintenance treatments" in the future.  The fee for BBL / laser treatments is $350 per treatment session for the whole face.  A fee can be quoted for other parts of the body. Insurance typically does not pay for BBL/laser treatments and therefore the fee is an out-of-pocket cost.   Cont Metronidazole cream twice a day as needed for bumps   ANDROGENETIC ALOPECIA (FEMALE PATTERN HAIR LOSS) Exam: Diffuse thinning of the crown and widening of the midline part with retention of the frontal hairline  Chronic and persistent condition with duration or expected duration over one year. Condition is bothersome/symptomatic for patient. Currently flared.   Female Androgenic Alopecia is a chronic condition related to genetics and/or hormonal changes.  In women androgenetic alopecia is commonly associated with menopause but may occur any time after puberty.  It causes hair thinning primarily on the crown with widening of the part and temporal hairline recession.  Can use OTC Rogaine (minoxidil) 5% solution/foam as directed.  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 Continue biotin Continue vitamin d Patient reports thyroid problems and take medication Discussed oral minoxidil and finasteride Will further evaluate and treat at next followup after get path report   HISTORY OF BASAL CELL CARCINOMA OF THE SKIN Right popliteal fossa 10/2021 trx [facility] - No evidence of recurrence today - Recommend regular full body skin exams - Recommend daily broad spectrum sunscreen SPF 30+ to sun-exposed areas, reapply every 2 hours as needed.  - Call if any new or changing lesions are noted between office visits  INFLAMED SEBORRHEIC KERATOSIS left forearm x 1 Symptomatic, irritating, patient would like treated. Destruction of lesion - left forearm x 1  Destruction method: cryotherapy   Informed consent: discussed and consent obtained   Lesion destroyed using liquid nitrogen: Yes   Region frozen until ice ball extended beyond lesion: Yes   Outcome: patient tolerated procedure well with no complications   Post-procedure details: wound care instructions given   Additional details:  Prior to procedure, discussed risks of blister formation, small wound, skin dyspigmentation, or rare scar following cryotherapy. Recommend Vaseline ointment to treated areas while healing.  Return for schedule hair loss appt , 1 year tbse .  I, Asher Muir, CMA, am acting as scribe for Willeen Niece, MD.   Documentation: I have reviewed the above documentation for accuracy and completeness, and I agree with the above.  Willeen Niece,  MD

## 2023-05-26 NOTE — Patient Instructions (Addendum)
Female Androgenic Alopecia is a chronic condition related to genetics and/or hormonal changes.  In women androgenetic alopecia is commonly associated with menopause but may occur any time after puberty.  It causes hair thinning primarily on the crown with widening of the part and temporal hairline recession.  Can use OTC Rogaine (minoxidil) 5% solution/foam as directed.  Oral treatments in female patients who have no contraindication may include : - Low dose oral minoxidil 1.25 - 5mg  daily - Spironolactone 50 - 100mg  bid - Finasteride 2.5 - 5 mg daily Adjunctive therapies include: - Low Level Laser Light Therapy (LLLT) - Platelet-rich plasma injections (PRP) - Hair Transplants or scalp reduction   Gentle Skin Care Guide  1. Bathe no more than once a day.  2. Avoid bathing in hot water  3. Use a mild soap like Dove, Vanicream, Cetaphil, CeraVe. Can use Lever 2000 or Cetaphil antibacterial soap  4. Use soap only where you need it. On most days, use it under your arms, between your legs, and on your feet. Let the water rinse other areas unless visibly dirty.  5. When you get out of the bath/shower, use a towel to gently blot your skin dry, don't rub it.  6. While your skin is still a little damp, apply a moisturizing cream such as Vanicream, CeraVe, Cetaphil, Eucerin, Sarna lotion or plain Vaseline Jelly. For hands apply Neutrogena [place] Hand Cream or Excipial Hand Cream.  7. Reapply moisturizer any time you start to itch or feel dry.  8. Sometimes using free and clear laundry detergents can be helpful. Fabric softener sheets should be avoided. Downy Free & Gentle liquid, or any liquid fabric softener that is free of dyes and perfumes, it acceptable to use  9. If your doctor has given you prescription creams you may apply moisturizers over them       Seborrheic Keratosis  What causes seborrheic keratoses? Seborrheic keratoses are harmless, common skin growths that first appear  during adult life.  As time goes by, more growths appear.  Some people may develop a large number of them.  Seborrheic keratoses appear on both covered and uncovered body parts.  They are not caused by sunlight.  The tendency to develop seborrheic keratoses can be inherited.  They vary in color from skin-colored to gray, brown, or even black.  They can be either smooth or have a rough, warty surface.   Seborrheic keratoses are superficial and look as if they were stuck on the skin.  Under the microscope this type of keratosis looks like layers upon layers of skin.  That is why at times the top layer may seem to fall off, but the rest of the growth remains and re-grows.    Treatment Seborrheic keratoses do not need to be treated, but can easily be removed in the office.  Seborrheic keratoses often cause symptoms when they rub on clothing or jewelry.  Lesions can be in the way of shaving.  If they become inflamed, they can cause itching, soreness, or burning.  Removal of a seborrheic keratosis can be accomplished by freezing, burning, or surgery. If any spot bleeds, scabs, or grows rapidly, please return to have it checked, as these can be an indication of a skin cancer.   Melanoma ABCDEs  Melanoma is the most dangerous type of skin cancer, and is the leading cause of death from skin disease.  You are more likely to develop melanoma if you: Have light-colored skin, light-colored eyes, or red or  blond hair Spend a lot of time in the sun Tan regularly, either outdoors or in a tanning bed Have had blistering sunburns, especially during childhood Have a close family member who has had a melanoma Have atypical moles or large birthmarks  Early detection of melanoma is key since treatment is typically straightforward and cure rates are extremely high if we catch it early.   The first sign of melanoma is often a change in a mole or a new dark spot.  The ABCDE system is a way of remembering the signs of  melanoma.  A for asymmetry:  The two halves do not match. B for border:  The edges of the growth are irregular. C for color:  A mixture of colors are present instead of an even brown color. D for diameter:  Melanomas are usually (but not always) greater than 6mm - the size of a pencil eraser. E for evolution:  The spot keeps changing in size, shape, and color.  Please check your skin once per month between visits. You can use a small mirror in front and a large mirror behind you to keep an eye on the back side or your body.   If you see any new or changing lesions before your next follow-up, please call to schedule a visit.  Please continue daily skin protection including broad spectrum sunscreen SPF 30+ to sun-exposed areas, reapplying every 2 hours as needed when you're outdoors.   Staying in the shade or wearing long sleeves, sun glasses (UVA+UVB protection) and wide brim hats (4-inch brim around the entire circumference of the hat) are also recommended for sun protection.    Due to recent changes in healthcare laws, you may see results of your pathology and/or laboratory studies on MyChart before the doctors have had a chance to review them. We understand that in some cases there may be results that are confusing or concerning to you. Please understand that not all results are received at the same time and often the doctors may need to interpret multiple results in order to provide you with the best plan of care or course of treatment. Therefore, we ask that you please give [place] 2 business days to thoroughly review all your results before contacting the office for clarification. Should we see a critical lab result, you will be contacted sooner.   If You Need Anything After Your Visit  If you have any questions or concerns for your doctor, please call our main line at [phone] and press option 4 to reach your doctor's medical assistant. If no one answers, please leave a voicemail as  directed and we will return your call as soon as possible. Messages left after 4 pm will be answered the following business day.   You may also send [place] a message via MyChart. We typically respond to MyChart messages within 1-2 business days.  For prescription refills, please ask your pharmacy to contact our office. Our fax number is [phone].  If you have an urgent issue when the clinic is closed that cannot wait until the next business day, you can page your doctor at the number below.    Please note that while we do our best to be available for urgent issues outside of office hours, we are not available 24/7.   If you have an urgent issue and are unable to reach [place], you may choose to seek medical care at your doctor's office, retail clinic, urgent care center, or emergency room.  If  you have a medical emergency, please immediately call 911 or go to the emergency department.  Pager Numbers  - Dr. Gwen Pounds: [phone]  - Dr. Roseanne Reno: [phone]  - Dr. Katrinka Blazing: [phone]   In the event of inclement weather, please call our main line at [phone] for an update on the status of any delays or closures.  Dermatology Medication Tips: Please keep the boxes that topical medications come in in order to help keep track of the instructions about where and how to use these. Pharmacies typically print the medication instructions only on the boxes and not directly on the medication tubes.   If your medication is too expensive, please contact our office at [phone] option 4 or send [place] a message through MyChart.   We are unable to tell what your co-pay for medications will be in advance as this is different depending on your insurance coverage. However, we may be able to find a substitute medication at lower cost or fill out paperwork to get insurance to cover a needed medication.   If a prior authorization is required to get your medication covered by your insurance company, please  allow [place] 1-2 business days to complete this process.  Drug prices often vary depending on where the prescription is filled and some pharmacies may offer cheaper prices.  The website www.goodrx.com contains coupons for medications through different pharmacies. The prices here do not account for what the cost may be with help from insurance (it may be cheaper with your insurance), but the website can give you the price if you did not use any insurance.  - You can print the associated coupon and take it with your prescription to the pharmacy.  - You may also stop by our office during regular business hours and pick up a GoodRx coupon card.  - If you need your prescription sent electronically to a different pharmacy, notify our office through [facility] or by phone at [phone] option 4.     Si Usted Necesita Algo Despus de Su Visita  Tambin puede enviarnos un mensaje a travs de Clinical cytogeneticist. Por lo general respondemos a los mensajes de MyChart en el transcurso de 1 a 2 das hbiles.  Para renovar recetas, por favor pida a su farmacia que se ponga en contacto con nuestra oficina. Annie Sable de fax es [place] [phone].  Si tiene un asunto urgente cuando la clnica est cerrada y que no puede esperar hasta el siguiente da hbil, puede llamar/localizar a su doctor(a) al nmero que aparece a continuacin.   Por favor, tenga en cuenta que aunque hacemos todo lo posible para estar disponibles para asuntos urgentes fuera del horario de [place], no estamos disponibles las 24 horas del da, los 7 [address]  [address] de la [place].   Si tiene un problema urgente y no puede comunicarse con nosotros, puede optar por buscar atencin mdica  en el consultorio de su doctor(a), en una clnica privada, en un centro de atencin urgente o en una sala de emergencias.  Si tiene Engineer, drilling, por favor llame inmediatamente al 911 o vaya a la sala de emergencias.  Nmeros de bper  - Dr. Gwen Pounds:  [phone]  - Dra. Roseanne Reno: [phone]  - Dr. Katrinka Blazing: [phone]   En caso de inclemencias del tiempo, por favor llame a Lacy Duverney principal al [phone] para una actualizacin sobre el [place] de cualquier retraso o cierre.  Consejos para la medicacin en dermatologa: Por favor, guarde las cajas en las que vienen los medicamentos  de uso tpico para ayudarle a seguir las [employer] dnde y cmo usarlos. Las farmacias generalmente imprimen las instrucciones del medicamento slo en las cajas y no directamente en los tubos del [place].   Si su medicamento es muy caro, por favor, pngase en contacto con Rolm [place]ala llamando al [phone] y presione la opcin 4 o envenos un mensaje a travs de Clinical cytogeneticist.   No podemos decirle cul ser su copago por los medicamentos por adelantado ya que esto es diferente dependiendo de la cobertura de su seguro. Sin embargo, es posible que podamos encontrar un medicamento sustituto a Audiological scientist un formulario para que el seguro cubra el medicamento que se considera necesario.   Si se requiere una autorizacin previa para que su compaa de seguros [place] su medicamento, por favor permtanos de 1 a 2 das hbiles para completar [address]  Los precios de los medicamentos varan con frecuencia dependiendo del Environmental consultant de dnde se surte la receta y alguna farmacias pueden ofrecer precios ms baratos.  El sitio web www.goodrx.com tiene cupones para medicamentos de Health and safety inspector. Los precios aqu no tienen en cuenta lo que podra costar con la ayuda del seguro (puede ser ms barato con su seguro), pero el sitio web puede darle el precio si no utiliz Tourist information centre manager.  - Puede imprimir el cupn correspondiente y llevarlo con su receta a la farmacia.  - Tambin puede pasar por nuestra oficina durante el horario de atencin regular y Education officer, museum una tarjeta de cupones de GoodRx.  - Si necesita que su receta se enve electrnicamente a  una farmacia diferente, informe a nuestra oficina a travs de MyChart de  o por telfono llamando al [phone] y presione la opcin 4.

## 2023-06-02 DIAGNOSIS — M19011 Primary osteoarthritis, right shoulder: Secondary | ICD-10-CM | POA: Diagnosis not present

## 2023-06-02 DIAGNOSIS — M1711 Unilateral primary osteoarthritis, right knee: Secondary | ICD-10-CM | POA: Diagnosis not present

## 2023-06-02 DIAGNOSIS — M75101 Unspecified rotator cuff tear or rupture of right shoulder, not specified as traumatic: Secondary | ICD-10-CM | POA: Diagnosis not present

## 2023-07-08 DIAGNOSIS — Z1231 Encounter for screening mammogram for malignant neoplasm of breast: Secondary | ICD-10-CM | POA: Diagnosis not present

## 2023-09-01 DIAGNOSIS — R7303 Prediabetes: Secondary | ICD-10-CM | POA: Diagnosis not present

## 2023-09-01 DIAGNOSIS — R8290 Unspecified abnormal findings in urine: Secondary | ICD-10-CM | POA: Diagnosis not present

## 2023-09-01 DIAGNOSIS — I10 Essential (primary) hypertension: Secondary | ICD-10-CM | POA: Diagnosis not present

## 2023-09-01 DIAGNOSIS — E782 Mixed hyperlipidemia: Secondary | ICD-10-CM | POA: Diagnosis not present

## 2023-09-01 DIAGNOSIS — Z79899 Other long term (current) drug therapy: Secondary | ICD-10-CM | POA: Diagnosis not present

## 2023-09-01 DIAGNOSIS — E039 Hypothyroidism, unspecified: Secondary | ICD-10-CM | POA: Diagnosis not present

## 2023-09-08 DIAGNOSIS — Z79899 Other long term (current) drug therapy: Secondary | ICD-10-CM | POA: Diagnosis not present

## 2023-09-08 DIAGNOSIS — R7303 Prediabetes: Secondary | ICD-10-CM | POA: Diagnosis not present

## 2023-09-08 DIAGNOSIS — I10 Essential (primary) hypertension: Secondary | ICD-10-CM | POA: Diagnosis not present

## 2023-09-08 DIAGNOSIS — E782 Mixed hyperlipidemia: Secondary | ICD-10-CM | POA: Diagnosis not present

## 2023-09-08 DIAGNOSIS — D126 Benign neoplasm of colon, unspecified: Secondary | ICD-10-CM | POA: Diagnosis not present

## 2023-09-08 DIAGNOSIS — E039 Hypothyroidism, unspecified: Secondary | ICD-10-CM | POA: Diagnosis not present

## 2023-09-08 DIAGNOSIS — Z0000 Encounter for general adult medical examination without abnormal findings: Secondary | ICD-10-CM | POA: Diagnosis not present

## 2023-09-09 DIAGNOSIS — M1711 Unilateral primary osteoarthritis, right knee: Secondary | ICD-10-CM | POA: Diagnosis not present

## 2023-11-04 DIAGNOSIS — M1711 Unilateral primary osteoarthritis, right knee: Secondary | ICD-10-CM | POA: Diagnosis not present

## 2023-11-30 DIAGNOSIS — Z09 Encounter for follow-up examination after completed treatment for conditions other than malignant neoplasm: Secondary | ICD-10-CM | POA: Diagnosis not present

## 2023-11-30 DIAGNOSIS — Z860100 Personal history of colon polyps, unspecified: Secondary | ICD-10-CM | POA: Diagnosis not present

## 2023-12-03 DIAGNOSIS — M1711 Unilateral primary osteoarthritis, right knee: Secondary | ICD-10-CM | POA: Diagnosis not present

## 2024-01-10 ENCOUNTER — Other Ambulatory Visit: Payer: Self-pay

## 2024-01-10 ENCOUNTER — Emergency Department
Admission: EM | Admit: 2024-01-10 | Discharge: 2024-01-10 | Discharge status: Against Medical Advice | Attending: Emergency Medicine | Admitting: Emergency Medicine

## 2024-01-10 DIAGNOSIS — R1030 Lower abdominal pain, unspecified: Secondary | ICD-10-CM | POA: Diagnosis not present

## 2024-01-10 DIAGNOSIS — Z5321 Procedure and treatment not carried out due to patient leaving prior to being seen by health care provider: Secondary | ICD-10-CM | POA: Insufficient documentation

## 2024-01-10 LAB — COMPREHENSIVE METABOLIC PANEL WITH GFR
ALT: 27 U/L (ref 0–44)
AST: 35 U/L (ref 15–41)
Albumin: 4.2 g/dL (ref 3.5–5.0)
Alkaline Phosphatase: 81 U/L (ref 38–126)
Anion gap: 14 (ref 5–15)
BUN: 17 mg/dL (ref 8–23)
CO2: 19 mmol/L — ABNORMAL LOW (ref 22–32)
Calcium: 9.6 mg/dL (ref 8.9–10.3)
Chloride: 105 mmol/L (ref 98–111)
Creatinine, Ser: 0.72 mg/dL (ref 0.44–1.00)
GFR, Estimated: >60 mL/min (ref >60)
Glucose, Bld: 102 mg/dL — ABNORMAL HIGH (ref 70–99)
Potassium: 4.2 mmol/L (ref 3.5–5.1)
Sodium: 138 mmol/L (ref 135–145)
Total Bilirubin: 0.7 mg/dL (ref 0.0–1.2)
Total Protein: 7.9 g/dL (ref 6.5–8.1)

## 2024-01-10 LAB — CBC
HCT: 45.2 % (ref 36.0–46.0)
Hemoglobin: 14.7 g/dL (ref 12.0–15.0)
MCH: 30.9 pg (ref 26.0–34.0)
MCHC: 32.5 g/dL (ref 30.0–36.0)
MCV: 95 fL (ref 80.0–100.0)
Platelets: 282 K/uL (ref 150–400)
RBC: 4.76 MIL/uL (ref 3.87–5.11)
RDW: 12.3 % (ref 11.5–15.5)
WBC: 10.9 K/uL — ABNORMAL HIGH (ref 4.0–10.5)
nRBC: 0 % (ref 0.0–0.2)

## 2024-01-10 LAB — LIPASE, BLOOD: Lipase: 59 U/L — ABNORMAL HIGH (ref 11–51)

## 2024-01-10 NOTE — ED Triage Notes (Addendum)
 Pt to ED for lower abd pain and vaginal pain x1 month. Reports had surgery for prolapse 2 years ago. Pt crying in triage, attempted to provide emotional support. Repeatedly asking for somewhere to lay down, explained do not have bed available at the moment.

## 2024-01-11 ENCOUNTER — Encounter (HOSPITAL_COMMUNITY): Payer: Self-pay

## 2024-01-11 ENCOUNTER — Emergency Department (HOSPITAL_COMMUNITY)
Admission: EM | Admit: 2024-01-11 | Discharge: 2024-01-11 | Discharge status: Against Medical Advice | Source: Ambulatory Visit | Attending: Emergency Medicine | Admitting: Emergency Medicine

## 2024-01-11 ENCOUNTER — Other Ambulatory Visit: Payer: Self-pay

## 2024-01-11 ENCOUNTER — Ambulatory Visit (INDEPENDENT_AMBULATORY_CARE_PROVIDER_SITE_OTHER): Admitting: Obstetrics and Gynecology

## 2024-01-11 VITALS — BP 147/93 | HR 85 | Temp 98.1°F

## 2024-01-11 DIAGNOSIS — R112 Nausea with vomiting, unspecified: Secondary | ICD-10-CM | POA: Diagnosis not present

## 2024-01-11 DIAGNOSIS — Z5321 Procedure and treatment not carried out due to patient leaving prior to being seen by health care provider: Secondary | ICD-10-CM | POA: Diagnosis not present

## 2024-01-11 DIAGNOSIS — R109 Unspecified abdominal pain: Secondary | ICD-10-CM | POA: Insufficient documentation

## 2024-01-11 DIAGNOSIS — R350 Frequency of micturition: Secondary | ICD-10-CM

## 2024-01-11 LAB — POCT URINALYSIS DIP (CLINITEK)
Glucose, UA: NEGATIVE mg/dL
Leukocytes, UA: NEGATIVE
Nitrite, UA: NEGATIVE
POC PROTEIN,UA: NEGATIVE
Spec Grav, UA: 1.015 (ref 1.010–1.025)
Urobilinogen, UA: 0.2 U/dL
pH, UA: 7 (ref 5.0–8.0)

## 2024-01-11 LAB — COMPREHENSIVE METABOLIC PANEL WITH GFR
ALT: 32 U/L (ref 0–44)
AST: 32 U/L (ref 15–41)
Albumin: 4.5 g/dL (ref 3.5–5.0)
Alkaline Phosphatase: 105 U/L (ref 38–126)
Anion gap: 19 — ABNORMAL HIGH (ref 5–15)
BUN: 16 mg/dL (ref 8–23)
CO2: 18 mmol/L — ABNORMAL LOW (ref 22–32)
Calcium: 10.5 mg/dL — ABNORMAL HIGH (ref 8.9–10.3)
Chloride: 101 mmol/L (ref 98–111)
Creatinine, Ser: 0.5 mg/dL (ref 0.44–1.00)
GFR, Estimated: >60 mL/min (ref >60)
Glucose, Bld: 99 mg/dL (ref 70–99)
Potassium: 3.6 mmol/L (ref 3.5–5.1)
Sodium: 138 mmol/L (ref 135–145)
Total Bilirubin: 0.4 mg/dL (ref 0.0–1.2)
Total Protein: 7.4 g/dL (ref 6.5–8.1)

## 2024-01-11 LAB — URINALYSIS, ROUTINE W REFLEX MICROSCOPIC
Glucose, UA: NEGATIVE mg/dL
Ketones, ur: >80 mg/dL — AB
Nitrite: NEGATIVE
Protein, ur: NEGATIVE mg/dL
Specific Gravity, Urine: 1.015 (ref 1.005–1.030)
pH: 6.5 (ref 5.0–8.0)

## 2024-01-11 LAB — URINALYSIS, MICROSCOPIC (REFLEX)

## 2024-01-11 LAB — CBC
HCT: 41.8 % (ref 36.0–46.0)
Hemoglobin: 14.1 g/dL (ref 12.0–15.0)
MCH: 30.5 pg (ref 26.0–34.0)
MCHC: 33.7 g/dL (ref 30.0–36.0)
MCV: 90.5 fL (ref 80.0–100.0)
Platelets: 282 K/uL (ref 150–400)
RBC: 4.62 MIL/uL (ref 3.87–5.11)
RDW: 12.3 % (ref 11.5–15.5)
WBC: 10.5 K/uL (ref 4.0–10.5)
nRBC: 0 % (ref 0.0–0.2)

## 2024-01-11 LAB — LIPASE, BLOOD: Lipase: 40 U/L (ref 11–51)

## 2024-01-11 NOTE — Patient Instructions (Signed)
 Please go to [facility] long emergency room. I have contacted the nursing staff and they are aware you are coming. This does not mean you will get back to a room quicker, but they have some background on your situation.

## 2024-01-11 NOTE — ED Triage Notes (Signed)
 Pt presents to ED from home C/O abd pain X 1 month. Endorses nausea, vomiting last night. Went to [facility] ED yesterday, LWBS d/t wait.

## 2024-01-11 NOTE — Progress Notes (Signed)
 [facility] Return Visit  SUBJECTIVE  History of Present Illness: Samantha Booker is a 73 y.o. female seen for abdominal and vaginal pain. Patient reports this has been worsening for the past 3-4 weeks. She has a hx of s/p Anterior and posterior repair, enterocele repair, sacrospinous ligament fixation, perineorrhaphy, cystoscopy on 01/12/23 and is concerned perhaps her prolapse is back and causing her pain.   Patient endorses nausea, belching, upper GI discomfort, and pain in the abdomen that is generalized. Denies fevers, blood in stool, or blood in urine.   She endorses she has made drastic dietary changes in the last few months, including cutting out most carbs and sugars due to a diagnosis of pre-diabetes.   Past Medical History: Patient  has a past medical history of Actinic keratosis (10/30/2021), Actinic keratosis (05/15/2022), Atherosclerosis of aorta (HCC) (06/25/2016), DDD (degenerative disc disease), cervical, Foliclar lymph grade III, unsp, nodes of head, face, and nk (HCC), Hepatitis (1972), History of basal cell carcinoma (BCC) (10/30/2021), History of hepatitis B, History of kidney stones, Hyperlipidemia, Hypothyroidism, and Pneumonia.   Past Surgical History: She  has a past surgical history that includes Cholecystectomy (2011); Appendectomy (1979); suburethral sling (2012); Vaginal hysterectomy (1980); Eye surgery (Left, 10/05/2017); Cataract extraction (Bilateral, 2019); Root canal; Colonoscopy with propofol  (N/A, 05/20/2018); Inguinal hernia repair (Left, 12/15/2018); Laparoscopic unilateral salpingo oophorectomy (Right, 12/15/2018); Esophagogastroduodenoscopy (egd) with propofol  (N/A, 07/21/2019); Botox  injection (N/A, 09/24/2021); Hemorrhoid surgery (N/A, 09/24/2021); Anterior and posterior repair with sacrospinous fixation (N/A, 01/12/2023); Cystoscopy (01/12/2023); and Perineoplasty (N/A, 01/12/2023).   Medications: She has a current medication list which  includes the following prescription(s): acetaminophen , acyclovir cream, aspirin ec, atorvastatin , biotin, celecoxib , cholecalciferol, estradiol , hydrocortisone , ibuprofen , levothyroxine , lorazepam, pregabalin, premarin , and venlafaxine xr.   Allergies: Patient is allergic to penicillins.   Social History: Patient  reports that she quit smoking about 32 years ago. Her smoking use included cigarettes. She started smoking about 52 years ago. She has a 40 pack-year smoking history. She has never used smokeless tobacco. She reports current alcohol use of about 2.0 standard drinks of alcohol per week. She reports that she does not use drugs.     OBJECTIVE     Physical Exam: Vitals:   01/11/24 1246  BP: (!) 147/93  Pulse: 85  Temp: 98.1 F (36.7 C)  TempSrc: Oral   Gen: Patient tearful and appears in distress. She is pale and anxious.   Patient's abdomen is tender to the touch and she flinches on palpation. No rebound tenderness. No abdominal discoloration present on exam.   Detailed Urogynecologic Evaluation:  Vaginal region not concerning on exam. There is no sign of new prolapse with valsalva. No sign of bleeding, mass, or unusual discharge.    ASSESSMENT AND PLAN    Samantha Booker is a 73 y.o. with:  1. Acute abdominal pain   2. Urinary frequency    Patient needs to be evaluated in the ER where she can have a CT scan done and potentially an EKG based on the report that some of the pain is below her breast.  Urine sample not concerning for infection today and patient is refusing EMS transport. Patient's husband agrees to take her to [facility] long ER. I have contacted nursing staff to make them aware she is coming.  No sign of UTI on exam.   Patient to proceed to [facility] long emergency department for evaluation.   [name], NP

## 2024-01-12 ENCOUNTER — Ambulatory Visit
Admission: RE | Admit: 2024-01-12 | Discharge: 2024-01-12 | Disposition: A | Discharge status: Home / Self Care | Source: Ambulatory Visit

## 2024-01-12 ENCOUNTER — Other Ambulatory Visit: Payer: Self-pay

## 2024-01-12 DIAGNOSIS — K8689 Other specified diseases of pancreas: Secondary | ICD-10-CM | POA: Diagnosis not present

## 2024-01-12 DIAGNOSIS — N132 Hydronephrosis with renal and ureteral calculous obstruction: Secondary | ICD-10-CM | POA: Diagnosis not present

## 2024-01-12 DIAGNOSIS — R109 Unspecified abdominal pain: Secondary | ICD-10-CM

## 2024-01-12 DIAGNOSIS — N134 Hydroureter: Secondary | ICD-10-CM | POA: Diagnosis not present

## 2024-01-12 NOTE — Progress Notes (Signed)
 History of Present Illness:   Samantha Booker is a 73 y.o. female here for   Verbally consented to the use of AI for note-taking.   Chief Complaint  Patient presents with  . Abdominal Pain      History of Present Illness Samantha Booker is a 73 year old female who presents with abdominal pain and gastrointestinal symptoms. She is accompanied by her husband who provides additional information during the visit.  She has been experiencing diffuse abdominal pain primarily in the stomach area, which has been progressively worsening over the past month. The pain is associated with excessive gas and burping. Recently, she experienced vomiting, which she attributes to taking half of a pain pill, and had diarrhea for the first time on the day of the visit.  She denies any previous episodes similar to the current symptoms. She recalls a past gynecological issue that was resolved several years ago, and a recent evaluation by a gynecologist confirmed no recurrence of that issue. She has not had any other abdominal surgeries aside from childbirth.  She reports feeling cold all the time, which is unusual for her as she is typically warm. She has been crying frequently over the past couple of weeks due to the discomfort and difficulty in getting medical attention. No fever, chills, or urinary symptoms. Her bowel movements have been normal except for the recent episode of diarrhea.   Past Medical History:   Past Medical History:  Diagnosis Date  . Bleeding hemorrhoid   . Chickenpox   . DDD (degenerative disc disease), cervical   . HTN, goal below 140/90 08/27/2020  . Hyperlipidemia   . Hypothyroidism   . Kidney stones   . Liver disease Hepititis B    Past Surgical History:   Past Surgical History:  Procedure Laterality Date  . HYSTERECTOMY  1979   TVH one ovary removed  . APPENDECTOMY  1979  . urethropexy  2010  . sub urethral sling  04/28/2009  . CATARACT EXTRACTION  2019  . lap vs  open It inguinal hernia repair N/A 12/15/2018   other ovary removed at this time  . HEMORROIDECTOMY N/A 09/24/2021   Dr. Lucas Sjogren W/ BOTOX  for fissure  . anal botox   09/24/2021   Dr Lucas Catchings  . ANTERIOR AND POSTERIOR REPAIR WITH SACROSPINOUS FIXATION     . BLADDER SURGERY    . CHOLECYSTECTOMY    . KNEE ARTHROSCOPY Right   . LENS EYE SURGERY Bilateral     Allergies:   Allergies  Allergen Reactions  . Penicillins Other (See Comments)    Doesn't work     Current Medications:   Prior to Admission medications  Medication Sig Taking? Last Dose  acyclovir (ZOVIRAX) 5 % cream APPLY TO AFFECTED AREA EVERY 4 HOURS PRN Yes Taking  aspirin 81 MG EC tablet Take 1 tablet by mouth once daily. Yes Taking  atorvastatin  (LIPITOR) 40 MG tablet TAKE 1 TABLET BY MOUTH AT BEDTIME Yes Taking  BIOTIN ORAL Take 1 tablet by mouth once daily Yes Taking  celecoxib  (CELEBREX ) 200 MG capsule TAKE 1 CAPSULE BY MOUTH TWICE DAILY Yes Taking  cholecalciferol (VITAMIN D3) 1,000 unit tablet Take by mouth Take 1,000 Units by mouth daily. Yes Taking  levothyroxine  (SYNTHROID ) 100 MCG tablet TAKE 1 TABLET BY MOUTH ONCE DAILY ON AN EMPTY STOMACH. WAIT 30 MINUTES BEFORE TAKING OTHER MEDS. Yes Taking  LORazepam (ATIVAN) 0.5 MG tablet Take 1 tablet (0.5 mg total) by mouth 2 (two) times  daily as needed for Anxiety or Sleep Yes Taking  methylPREDNISolone (MEDROL DOSEPACK) 4 mg tablet Follow package directions. Yes Taking  metroNIDAZOLE  (METROCREAM ) 0.75 % cream Apply topically 2 (two) times daily Yes Taking  pregabalin (LYRICA) 75 MG capsule TAKE 1 CAPSULE BY MOUTH TWICE DAILY Yes Taking  PREMARIN  1.25 mg tablet TAKE 1 TABLET BY MOUTH ONCE DAILY Yes Taking  venlafaxine (EFFEXOR-XR) 75 MG XR capsule take 1 capsule by mouth every day Yes Taking    Family History:   Family History  Problem Relation Name Age of Onset  . High blood pressure (Hypertension) Mother Delayne Holiday   . Hyperlipidemia  (Elevated cholesterol) Mother Delayne Holiday   . COPD Mother Delayne Holiday   . Myocardial Infarction (Heart attack) Mother Delayne Holiday   . Diabetes Mother Delayne Holiday   . Myocardial Infarction (Heart attack) Father      Social History:   Social History   Socioeconomic History  . Marital status: Married  Occupational History  . Occupation: Retired  Tobacco Use  . Smoking status: Former    Current packs/day: 0.00    Types: Cigarettes  . Smokeless tobacco: Never  Vaping Use  . Vaping status: Never Used  Substance and Sexual Activity  . Alcohol use: Yes    Comment: moderate  . Drug use: No  . Sexual activity: Yes    Partners: Male    Birth control/protection: None, Surgical    Comment: Husband   Social Drivers of Corporate investment banker Strain: Low Risk  (11/30/2023)   Overall Financial Resource Strain (CARDIA)   . Difficulty of Paying Living Expenses: Not hard at all  Food Insecurity: No Food Insecurity (11/30/2023)   Hunger Vital Sign   . Worried About Programme researcher, broadcasting/film/video in the Last Year: Never true   . Ran Out of Food in the Last Year: Never true  Transportation Needs: No Transportation Needs (11/30/2023)   PRAPARE - Transportation   . Lack of Transportation (Medical): No   . Lack of Transportation (Non-Medical): No  Housing Stability: Low Risk  (11/30/2023)   Housing Stability Vital Sign   . Unable to Pay for Housing in the Last Year: No   . Number of Times Moved in the Last Year: 0   . Homeless in the Last Year: No    Review of Systems:   A 10 point review of systems is negative, except for the pertinent positives and negatives detailed in the HPI.  Vitals:   Vitals:   01/12/24 1142  BP: 120/72  Pulse: 83  SpO2: 99%  Weight: 53.2 kg (117 lb 3.2 oz)     Body mass index is 21.44 kg/m.  Physical Exam:   Physical Exam Vitals and nursing note reviewed.  Constitutional:      General: She is not in acute distress.    Appearance: Normal appearance. She is  not ill-appearing, toxic-appearing or diaphoretic.  HENT:     Head: Normocephalic and atraumatic.     Right Ear: External ear normal.     Left Ear: External ear normal.  Eyes:     Conjunctiva/sclera: Conjunctivae normal.  Cardiovascular:     Rate and Rhythm: Normal rate and regular rhythm.     Pulses: Normal pulses.     Heart sounds: Normal heart sounds.  Pulmonary:     Effort: Pulmonary effort is normal.     Breath sounds: Normal breath sounds.  Abdominal:     General: Bowel sounds are normal.  Palpations: There is no mass.     Tenderness: There is abdominal tenderness. There is no rebound.  Neurological:     Mental Status: She is alert.     Assessment and Plan:  No results found for this visit on 01/12/24.   Assessment & Plan generalized abdominal pain with excessive gas, burping, and recent onset diarrhea Worsening abdominal pain with gas and burping, recent diarrhea. Differential includes acid reflux, SIBO, pancreatitis, C. difficile, colitis, or other intra-abdominal pathology concern for serious condition due to tenderness and diarrhea. - Order CBC to evaluate blood cell counts. - Order CMP to assess liver, kidney function, and electrolytes. - Order urinalysis to rule out infection. - Order CT scan of the abdomen to evaluate for serious conditions. - Consider acid reflux treatment if imaging and labs are normal. - Further lab testing such as that for SIBO to be considered  Disposition: Follow-up as needed  There are no Patient Instructions on file for this visit.   This note has been created using automated tools and reviewed for accuracy by provider.  Patient received an After Visit Summary    Attestation Statement:   I personally performed the service, non-incident to. (WP)   MASON MCCLELLAND MINOR, PA

## 2024-01-12 NOTE — Progress Notes (Signed)
 ENCOUNTER: Patient Class :No patient class for patient encounter Department: [facility] [address] [place] [address]  PATIENT: Patient Demographics      Name Patient ID SSN Gender Identity Birth Date   Samantha Booker, Samantha Booker [id number] Female [birth year] (72 yrs)          Address Phone Email       [address] [place] [address] [phone] ([email or web address]            [facility] Caucasian/White             Reg Status PCP Date Last Verified Next Review Date     Verified Samantha Booker BIRCH [phone] 01/08/24 02/07/24           Marital Status Religion Language       Married Control and instrumentation engineer English              EMERGENCY CONTACT: Name Relationship Lgl Grd Work Administrator, sports  1. Samantha Booker,Samantha Booker Spouse    [phone]    GUARANTOR: There is no guarantor information entered for this encounter.  COVERAGE: Primary Visit Coverage      Payer Plan Group Number Group Name Payer Phone Plan Phone   No coverage found                Secondary Visit Coverage      Payer Plan Group Number Group Name Payer Phone Plan Phone   No coverage found                Primary Coverage      Payer Plan Group Number Group Name Payer Phone Plan Phone   [employer] [facility] ADVANTAGE [phone]              Primary Subscriber      Subscriber ID Subscriber Name Subscriber [facility] Subscriber Address   [id number] Samantha Booker,Samantha Booker [id number] [address]      [place], [place] [address]           Secondary Coverage      Payer Plan Group Number Group Name Payer Phone Plan Phone   No coverage found

## 2024-01-18 ENCOUNTER — Inpatient Hospital Stay: Attending: Oncology | Admitting: Oncology

## 2024-01-18 ENCOUNTER — Encounter: Payer: Self-pay | Admitting: Oncology

## 2024-01-18 ENCOUNTER — Other Ambulatory Visit: Payer: Self-pay

## 2024-01-18 ENCOUNTER — Inpatient Hospital Stay

## 2024-01-18 VITALS — BP 138/82 | HR 77 | Temp 98.5°F | Resp 18 | Wt 119.6 lb

## 2024-01-18 DIAGNOSIS — R5383 Other fatigue: Secondary | ICD-10-CM | POA: Insufficient documentation

## 2024-01-18 DIAGNOSIS — R590 Localized enlarged lymph nodes: Secondary | ICD-10-CM | POA: Diagnosis not present

## 2024-01-18 DIAGNOSIS — C786 Secondary malignant neoplasm of retroperitoneum and peritoneum: Secondary | ICD-10-CM

## 2024-01-18 DIAGNOSIS — Z79899 Other long term (current) drug therapy: Secondary | ICD-10-CM | POA: Insufficient documentation

## 2024-01-18 DIAGNOSIS — R978 Other abnormal tumor markers: Secondary | ICD-10-CM | POA: Insufficient documentation

## 2024-01-18 DIAGNOSIS — R109 Unspecified abdominal pain: Secondary | ICD-10-CM | POA: Diagnosis not present

## 2024-01-18 DIAGNOSIS — Z85828 Personal history of other malignant neoplasm of skin: Secondary | ICD-10-CM | POA: Diagnosis not present

## 2024-01-18 DIAGNOSIS — R918 Other nonspecific abnormal finding of lung field: Secondary | ICD-10-CM | POA: Diagnosis not present

## 2024-01-18 DIAGNOSIS — Z87891 Personal history of nicotine dependence: Secondary | ICD-10-CM | POA: Insufficient documentation

## 2024-01-18 DIAGNOSIS — Z808 Family history of malignant neoplasm of other organs or systems: Secondary | ICD-10-CM | POA: Diagnosis not present

## 2024-01-18 DIAGNOSIS — K8689 Other specified diseases of pancreas: Secondary | ICD-10-CM | POA: Diagnosis not present

## 2024-01-18 DIAGNOSIS — C259 Malignant neoplasm of pancreas, unspecified: Secondary | ICD-10-CM | POA: Insufficient documentation

## 2024-01-18 DIAGNOSIS — Z803 Family history of malignant neoplasm of breast: Secondary | ICD-10-CM | POA: Diagnosis not present

## 2024-01-18 DIAGNOSIS — N1330 Unspecified hydronephrosis: Secondary | ICD-10-CM | POA: Insufficient documentation

## 2024-01-18 DIAGNOSIS — G893 Neoplasm related pain (acute) (chronic): Secondary | ICD-10-CM | POA: Insufficient documentation

## 2024-01-18 DIAGNOSIS — Z139 Encounter for screening, unspecified: Secondary | ICD-10-CM

## 2024-01-18 LAB — COMPREHENSIVE METABOLIC PANEL WITH GFR
ALT: 26 U/L (ref 0–44)
AST: 30 U/L (ref 15–41)
Albumin: 4.1 g/dL (ref 3.5–5.0)
Alkaline Phosphatase: 86 U/L (ref 38–126)
Anion gap: 9 (ref 5–15)
BUN: 16 mg/dL (ref 8–23)
CO2: 25 mmol/L (ref 22–32)
Calcium: 9.5 mg/dL (ref 8.9–10.3)
Chloride: 100 mmol/L (ref 98–111)
Creatinine, Ser: 0.68 mg/dL (ref 0.44–1.00)
GFR, Estimated: >60 mL/min (ref >60)
Glucose, Bld: 87 mg/dL (ref 70–99)
Potassium: 4 mmol/L (ref 3.5–5.1)
Sodium: 134 mmol/L — ABNORMAL LOW (ref 135–145)
Total Bilirubin: 0.5 mg/dL (ref 0.0–1.2)
Total Protein: 7.1 g/dL (ref 6.5–8.1)

## 2024-01-18 LAB — CBC WITH DIFFERENTIAL/PLATELET
Abs Immature Granulocytes: 0.06 K/uL (ref 0.00–0.07)
Basophils Absolute: 0.1 K/uL (ref 0.0–0.1)
Basophils Relative: 1 %
Eosinophils Absolute: 0.2 K/uL (ref 0.0–0.5)
Eosinophils Relative: 2 %
HCT: 38 % (ref 36.0–46.0)
Hemoglobin: 12.8 g/dL (ref 12.0–15.0)
Immature Granulocytes: 1 %
Lymphocytes Relative: 22 %
Lymphs Abs: 2.1 K/uL (ref 0.7–4.0)
MCH: 30.7 pg (ref 26.0–34.0)
MCHC: 33.7 g/dL (ref 30.0–36.0)
MCV: 91.1 fL (ref 80.0–100.0)
Monocytes Absolute: 0.8 K/uL (ref 0.1–1.0)
Monocytes Relative: 9 %
Neutro Abs: 6.4 K/uL (ref 1.7–7.7)
Neutrophils Relative %: 65 %
Platelets: 278 K/uL (ref 150–400)
RBC: 4.17 MIL/uL (ref 3.87–5.11)
RDW: 12.2 % (ref 11.5–15.5)
WBC: 9.7 K/uL (ref 4.0–10.5)
nRBC: 0 % (ref 0.0–0.2)

## 2024-01-18 LAB — VITAMIN B12: Vitamin B-12: 784 pg/mL (ref 180–914)

## 2024-01-18 LAB — TSH: TSH: 1.444 u[IU]/mL (ref 0.350–4.500)

## 2024-01-18 NOTE — Assessment & Plan Note (Signed)
 Noncontrast CT abdomen pelvis imaging findings were reviewed and discussed with patient. Pancreatic tail mass with peritoneal/omental nodularity.  Concerning for primary pancreatic cancer with metastasis. Recommend to complete staging with CT chest abdomen pelvis with contrast.  Check CA 19-9, CEA. I recommend CT-guided biopsy of peritoneal/omental nodularity to establish tissue diagnosis. Further treatments pending on biopsy results.

## 2024-01-18 NOTE — Progress Notes (Signed)
 Met with Samantha Booker during and after consult with Dr. Babara. Introduced nurse navigator services and provided my contact information for future needs.Arranging CT guided omental biopsy.

## 2024-01-18 NOTE — Assessment & Plan Note (Signed)
 Patient reports intolerability with any narcotics.  Recommend patient to alternate Tylenol  with ibuprofen  as needed.

## 2024-01-18 NOTE — Progress Notes (Signed)
 Hematology/Oncology Consult note [phone] [phone]        REFERRING PROVIDER: Minor, Jonette, PA   CHIEF COMPLAINTS/REASON FOR VISIT:  Evaluation of    ASSESSMENT & PLAN:   Pancreatic mass Noncontrast CT abdomen pelvis imaging findings were reviewed and discussed with patient. Pancreatic tail mass with peritoneal/omental nodularity.  Concerning for primary pancreatic cancer with metastasis. Recommend to complete staging with CT chest abdomen pelvis with contrast.  Check CA 19-9, CEA. I recommend CT-guided biopsy of peritoneal/omental nodularity to establish tissue diagnosis. Further treatments pending on biopsy results.  Abdominal discomfort Patient reports intolerability with any narcotics.  Recommend patient to alternate Tylenol  with ibuprofen  as needed.  Fatigue Likely secondary to underlying malignancy.  Check TSH and B12 level.   Orders Placed This Encounter  Procedures   CT CHEST ABDOMEN PELVIS W CONTRAST    Standing Status:   Future    Expected Date:   01/21/2024    Expiration Date:   01/17/2025    If indicated for the ordered procedure, I authorize the administration of contrast media per Radiology protocol:   Yes    Does the patient have a contrast media/X-ray dye allergy?:   No    Preferred imaging location?:   [facility]    If indicated for the ordered procedure, I authorize the administration of oral contrast media per Radiology protocol:   Yes   CBC with Differential/Platelet    Standing Status:   Future    Number of Occurrences:   1    Expected Date:   01/18/2024    Expiration Date:   04/17/2024   Comprehensive metabolic panel with GFR    Standing Status:   Future    Number of Occurrences:   1    Expected Date:   01/18/2024    Expiration Date:   04/17/2024   Cancer antigen 19-9    Standing Status:   Future    Number of Occurrences:   1    Expected Date:   01/18/2024    Expiration Date:   04/17/2024   CEA    Standing  Status:   Future    Number of Occurrences:   1    Expected Date:   01/18/2024    Expiration Date:   04/17/2024   TSH    Standing Status:   Future    Number of Occurrences:   1    Expected Date:   01/18/2024    Expiration Date:   04/17/2024   Vitamin B12    Standing Status:   Future    Number of Occurrences:   1    Expected Date:   01/18/2024    Expiration Date:   04/17/2024   Ambulatory referral to Social Work    Referral Priority:   Routine    Referral Type:   Consultation    Referral Reason:   Specialty Services Required    Number of Visits Requested:   1    All questions were answered. The patient knows to call the clinic with any problems, questions or concerns.  Zelphia Cap, MD, PhD [facility] Hematology Oncology 01/18/2024   HISTORY OF PRESENTING ILLNESS:   Samantha Booker is a  73 y.o.  female with PMH listed below was seen in consultation at the request of  Minor, [place], [place]  for evaluation of pancreatic mass.  Discussed the use of AI scribe software for clinical note transcription with the patient, who gave verbal consent to proceed.  She has been experiencing abdominal pain and bloating for the past month, with the pain progressively worsening. The pain is severe and debilitating at times, with episodes that have left her bedridden, such as during a recent trip to the beach. The pain is often accompanied by a sensation of bloating and fullness. She has also noticed recent constipation, despite previously regular bowel movements.  She underwent a surgical procedure in 2023 for a prolapse, during which her stomach, intestines, and bladder were repositioned. She initially suspected that her current symptoms might be related to post-surgical changes.  Her appetite remains good, and she has been following a no-carb, no-sugar diet prior to the onset of symptoms. she has experienced a weight loss of 15 pounds over the past month, which she partially attributes to her diet.  She  has a history of using Premarin , an estrogen replacement therapy, since her total hysterectomy at age 68. She currently takes a 1.25 mg pill, cut into quarters.   For pain management, she uses Motrin  and Tylenol , as she cannot tolerate opioids due to adverse reactions. She reports night sweats over the past couple of weeks and has been experiencing weakness, which she attributes to her current condition. She occasionally uses lorazepam at night to aid sleep.  Her family history is significant for breast cancer, with her maternal grandmother, her mother's sister, and her daughter having had breast cancer. A cousin had a brain tumor, and her cousin's daughter had precancerous conditions.  Patient is up to date for mammogram.  She denies any breast concerns.  MEDICAL HISTORY:  Past Medical History:  Diagnosis Date   Actinic keratosis 10/30/2021   R forearm LN2 05/15/2022   Actinic keratosis 05/15/2022   left upper arm, treated with LN2 05/20/2022   Atherosclerosis of aorta 06/25/2016   Imaging Feb 2018   DDD (degenerative disc disease), cervical    Foliclar lymph grade III, unsp, nodes of head, face, and nk (HCC)    Hepatitis 1972   Hep B   History of basal cell carcinoma (BCC) 10/30/2021   right popliteal fossa BCC NODULAR PATTERN, BASE INVOLVED  treated with ED&C   History of hepatitis B    History of kidney stones    in past   Hyperlipidemia    Hypothyroidism    Pneumonia    as child    SURGICAL HISTORY: Past Surgical History:  Procedure Laterality Date   ANTERIOR AND POSTERIOR REPAIR WITH SACROSPINOUS FIXATION N/A 01/12/2023   Procedure: ANTERIOR AND POSTERIOR REPAIR WITH SACROSPINOUS FIXATION;  Surgeon: Marilynne Rosaline SAILOR, MD;  Location: [facility];  Service: Gynecology;  Laterality: N/A;  Total time requested is 1.5 hrs   APPENDECTOMY  1979   BOTOX  INJECTION N/A 09/24/2021   Procedure: BOTOX  INJECTION;  Surgeon: Rodolph Romano, MD;  Location: [facility];   Service: General;  Laterality: N/A;   CATARACT EXTRACTION Bilateral 2019   CHOLECYSTECTOMY  2011   COLONOSCOPY WITH PROPOFOL  N/A 05/20/2018   Procedure: COLONOSCOPY WITH PROPOFOL ;  Surgeon: Unk Corinn Skiff, MD;  Location: [facility];  Service: Gastroenterology;  Laterality: N/A;   CYSTOSCOPY  01/12/2023   Procedure: CYSTOSCOPY;  Surgeon: Marilynne Rosaline SAILOR, MD;  Location: [facility];  Service: Gynecology;;   ESOPHAGOGASTRODUODENOSCOPY (EGD) WITH PROPOFOL  N/A 07/21/2019   Procedure: ESOPHAGOGASTRODUODENOSCOPY (EGD) WITH PROPOFOL ;  Surgeon: Unk Corinn Skiff, MD;  Location: [facility];  Service: Endoscopy;  Laterality: N/A;   EYE SURGERY Left 10/05/2017   HEMORRHOID SURGERY N/A 09/24/2021  Procedure: HEMORRHOIDECTOMY;  Surgeon: Rodolph Romano, MD;  Location: [facility];  Service: General;  Laterality: N/A;   INGUINAL HERNIA REPAIR Left 12/15/2018   Procedure: LAPAROSCOPIC LEFT INGUINAL HERNIA VS OPEN;  Surgeon: Rodolph Romano, MD;  Location: [facility];  Service: General;  Laterality: Left;   LAPAROSCOPIC UNILATERAL SALPINGO OOPHERECTOMY Right 12/15/2018   Procedure: LAPAROSCOPIC UNILATERAL OOPHORECTOMY;  Surgeon: Ward, Mitzie BROCKS, MD;  Location: [facility];  Service: Gynecology;  Laterality: Right;   PERINEOPLASTY N/A 01/12/2023   Procedure: PERINEOPLASTY;  Surgeon: Marilynne Rosaline SAILOR, MD;  Location: [facility];  Service: Gynecology;  Laterality: N/A;   ROOT CANAL     suburethral sling  2012   Dr. Barbaraann   VAGINAL HYSTERECTOMY  1980   precancerous cells    SOCIAL HISTORY: Social History   Socioeconomic History   Marital status: Married    Spouse name: Not on file   Number of children: 2   Years of education: Not on file   Highest education level: Not on file  Occupational History   Occupation: retired  Tobacco Use   Smoking status: Former    Current packs/day: 0.00    Average packs/day: 2.0 packs/day for 20.0 years  (40.0 ttl pk-yrs)    Types: Cigarettes    Start date: 12/09/1971    Quit date: 12/09/1991    Years since quitting: 32.1   Smokeless tobacco: Never  Vaping Use   Vaping status: Never Used  Substance and Sexual Activity   Alcohol use: Yes    Alcohol/week: 2.0 standard drinks of alcohol    Types: 2 Glasses of wine per week    Comment: occ   Drug use: No   Sexual activity: Yes    Partners: Male  Other Topics Concern   Not on file  Social History Narrative   Lives in [place]. Married. 2 children, 1 living in [place], 1 in [place]   Social Drivers of Health   Financial Resource Strain: Low Risk  (01/18/2024)   Overall Financial Resource Strain (CARDIA)    Difficulty of Paying Living Expenses: Not very hard  Food Insecurity: No Food Insecurity (01/18/2024)   Hunger Vital Sign    Worried About Running Out of Food in the Last Year: Never true    Ran Out of Food in the Last Year: Never true  Transportation Needs: Unknown (01/18/2024)   PRAPARE - Administrator, Civil Service (Medical): No    Lack of Transportation (Non-Medical): Not on file  Physical Activity: Not on file  Stress: No Stress Concern Present (01/18/2024)   [employer] of Occupational Health - Occupational Stress Questionnaire    Feeling of Stress: Only a little  Social Connections: Not on file  Intimate Partner Violence: Not At Risk (01/18/2024)   Humiliation, Afraid, Rape, and Kick questionnaire    Fear of Current or Ex-Partner: No    Emotionally Abused: No    Physically Abused: No    Sexually Abused: No    FAMILY HISTORY: Family History  Problem Relation Age of Onset   Diabetes Mother    Heart disease Mother        CABG 2007   Hyperlipidemia Mother    COPD Mother    Hyperlipidemia Father    Heart disease Father    Parkinson's disease Brother    Cancer Maternal Grandmother        breast   Cancer Maternal Aunt        breast   Cancer Cousin  brain   Cancer Cousin        melanoma    Cancer Daughter        breast cancer    ALLERGIES:  is allergic to penicillins.  MEDICATIONS:  Current Outpatient Medications  Medication Sig Dispense Refill   acetaminophen  (TYLENOL ) 500 MG tablet Take 1 tablet (500 mg total) by mouth every 6 (six) hours as needed (pain). 30 tablet 0   acyclovir cream (ZOVIRAX) 5 % Apply 1 application topically every 4 (four) hours as needed (for cold sores).      aspirin EC 81 MG tablet Take 1 tablet (81 mg total) by mouth daily.     atorvastatin  (LIPITOR) 40 MG tablet Take 1 tablet (40 mg total) by mouth at bedtime. (Patient taking differently: Take 40 mg by mouth every morning.) 90 tablet 1   BIOTIN PO Take 1 tablet by mouth daily.     celecoxib  (CELEBREX ) 200 MG capsule Take 200 mg by mouth 2 (two) times daily.     cholecalciferol (VITAMIN D3) 25 MCG (1000 UT) tablet Take 1,000 Units by mouth daily.     estradiol  (ESTRACE ) 0.1 MG/GM vaginal cream Place 0.5g nightly for two weeks then twice a week after 30 g 11   hydrocortisone  (ANUSOL -HC) 2.5 % rectal cream Place 1 application rectally 2 (two) times daily. 30 g 1   ibuprofen  (ADVIL ) 600 MG tablet Take 1 tablet (600 mg total) by mouth every 6 (six) hours as needed. (Patient taking differently: Take 600 mg by mouth every 6 (six) hours as needed (for after surgery).) 30 tablet 0   levothyroxine  (SYNTHROID ) 112 MCG tablet Take 112 mcg by mouth daily before breakfast.     LORazepam (ATIVAN) 0.5 MG tablet Take 0.5 mg by mouth 2 (two) times daily as needed.     pregabalin (LYRICA) 75 MG capsule Take 75 mg by mouth 2 (two) times daily.     PREMARIN  1.25 MG tablet Take 1.25 mg by mouth daily. Takes 1/4 of 1.25 mg daily     venlafaxine XR (EFFEXOR-XR) 75 MG 24 hr capsule Take by mouth.     No current facility-administered medications for this visit.    Review of Systems  Constitutional:  Negative for appetite change, chills, fatigue and fever.  HENT:   Negative for hearing loss and voice change.   Eyes:   Negative for eye problems.  Respiratory:  Negative for chest tightness and cough.   Cardiovascular:  Negative for chest pain.  Gastrointestinal:  Negative for abdominal distention, blood in stool, nausea and vomiting.       Abdominal bloating and discomfort.  Endocrine: Negative for hot flashes.  Genitourinary:  Negative for difficulty urinating and frequency.   Musculoskeletal:  Negative for arthralgias.  Skin:  Negative for itching and rash.  Neurological:  Negative for extremity weakness.  Hematological:  Negative for adenopathy.  Psychiatric/Behavioral:  Negative for confusion.    PHYSICAL EXAMINATION:  Vitals:   01/18/24 1501 01/18/24 1515  BP: (!) 142/91 138/82  Pulse: 77   Resp: 18   Temp: 98.5 F (36.9 C)   SpO2: 97%    Filed Weights   01/18/24 1501  Weight: 119 lb 9.6 oz (54.3 kg)    Physical Exam Constitutional:      General: She is not in acute distress. HENT:     Head: Normocephalic and atraumatic.  Eyes:     General: No scleral icterus. Cardiovascular:     Rate and Rhythm: Normal rate and regular  rhythm.     Heart sounds: Normal heart sounds.  Pulmonary:     Effort: Pulmonary effort is normal. No respiratory distress.     Breath sounds: Normal breath sounds. No wheezing.  Abdominal:     General: Bowel sounds are normal. There is distension.     Palpations: Abdomen is soft.  Musculoskeletal:        General: No deformity. Normal range of motion.     Cervical back: Normal range of motion and neck supple.  Skin:    General: Skin is warm and dry.     Findings: No erythema or rash.  Neurological:     Mental Status: She is alert and oriented to person, place, and time. Mental status is at baseline.     Cranial Nerves: No cranial nerve deficit.     Coordination: Coordination normal.  Psychiatric:        Mood and Affect: Mood normal.     LABORATORY DATA:  I have reviewed the data as listed    Latest Ref Rng & Units 01/18/2024    4:05 PM 01/11/2024     1:55 PM 01/10/2024   10:27 AM  CBC  WBC 4.0 - 10.5 K/uL 9.7  10.5  10.9   Hemoglobin 12.0 - 15.0 g/dL 87.1  85.8  85.2   Hematocrit 36.0 - 46.0 % 38.0  41.8  45.2   Platelets 150 - 400 K/uL 278  282  282       Latest Ref Rng & Units 01/18/2024    4:05 PM 01/11/2024    1:55 PM 01/10/2024   10:27 AM  CMP  Glucose 70 - 99 mg/dL 87  99  897   BUN 8 - 23 mg/dL 16  16  17    Creatinine 0.44 - 1.00 mg/dL 9.31  9.49  9.27   Sodium 135 - 145 mmol/L 134  138  138   Potassium 3.5 - 5.1 mmol/L 4.0  3.6  4.2   Chloride 98 - 111 mmol/L 100  101  105   CO2 22 - 32 mmol/L 25  18  19    Calcium  8.9 - 10.3 mg/dL 9.5  89.4  9.6   Total Protein 6.5 - 8.1 g/dL 7.1  7.4  7.9   Total Bilirubin 0.0 - 1.2 mg/dL 0.5  0.4  0.7   Alkaline Phos 38 - 126 U/L 86  105  81   AST 15 - 41 U/L 30  32  35   ALT 0 - 44 U/L 26  32  27       RADIOGRAPHIC STUDIES: I have personally reviewed the radiological images as listed and agreed with the findings in the report. CT ABDOMEN PELVIS WO CONTRAST Result Date: 01/12/2024 CLINICAL DATA:  Abdominal pain EXAM: CT ABDOMEN AND PELVIS WITHOUT CONTRAST TECHNIQUE: Multidetector CT imaging of the abdomen and pelvis was performed following the standard protocol without IV contrast. RADIATION DOSE REDUCTION: This exam was performed according to the departmental dose-optimization program which includes automated exposure control, adjustment of the mA and/or kV according to patient size and/or use of iterative reconstruction technique. COMPARISON:  10/22/2018 FINDINGS: Lower chest: No acute abnormality Hepatobiliary: No focal liver abnormality is seen. Status post cholecystectomy. No biliary dilatation. Pancreas: Pancreatic body mass suspected measuring 4.1 x 2.8 cm with truncation/atrophy of the pancreatic tail beyond the mass. This is a new appearance when compared to prior study. No ductal dilatation. No surrounding inflammation. Spleen: No focal abnormality.  Normal size. Adrenals/Urinary  Tract: Moderate right hydronephrosis and hydroureter. No visible obstructing stones. Area of cortical thinning/scarring in the mid to lower pole of the right kidney with parenchymal calcification, stable since prior study. Punctate nonobstructing left lower pole stone. No hydronephrosis on the left. Adrenal glands and urinary bladder unremarkable. Stomach/Bowel: Stomach, large and small bowel grossly unremarkable. Vascular/Lymphatic: Heavily calcified aorta and iliac vessels. No evidence of aneurysm or adenopathy. Reproductive: Prior hysterectomy.  No adnexal masses. Other: Free fluid in the pelvis. No free air. There are peritoneal nodules noted in the anterior and left abdomen. Omental thickening/nodularity noted. Musculoskeletal: No acute or suspicious focal osseous abnormality. Scoliosis and degenerative changes in the lumbar spine. IMPRESSION: Masslike area in the pancreatic body measuring 4.1 x 2.8 cm with atrophy of the pancreatic tail. Findings concerning for pancreatic mass/neoplasm. Recommend further evaluation with MRI. 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 Small to mild free fluid in the pelvis. Right hydronephrosis and hydroureter without visible obstructing stone. Left nephrolithiasis. Aortoiliac atherosclerosis. Electronically Signed   By: Franky Crease M.D.   On: 01/12/2024 13:34

## 2024-01-18 NOTE — Assessment & Plan Note (Signed)
 Likely secondary to underlying malignancy.  Check TSH and B12 level.

## 2024-01-19 ENCOUNTER — Other Ambulatory Visit: Payer: Self-pay

## 2024-01-19 ENCOUNTER — Telehealth: Payer: Self-pay

## 2024-01-19 LAB — CEA: CEA: 46.2 ng/mL — ABNORMAL HIGH (ref 0.0–4.7)

## 2024-01-19 LAB — CANCER ANTIGEN 19-9: CA 19-9: 14774 U/mL — ABNORMAL HIGH (ref 0–35)

## 2024-01-19 NOTE — Telephone Encounter (Signed)
 Clinical Social Work was referred by Dr. Babara for assessment of depression.  CSW attempted to contact patient by phone.  Left voicemail with contact information and request for return call.

## 2024-01-19 NOTE — Progress Notes (Signed)
 Jenna Cordella LABOR, MD sent to [facility]lie Clar[facility] RAMAN Sure.  CT with moderate sedation.  Any provider.       Previous Messages    ----- Message ----- From: Carlie Clarita RAMAN Sent: 01/19/2024   2:03 PM EDT To: Cordella LABOR Jenna, MD Subject: RE: CT Biopsy                                   Dr Jenna,  Dr Babara sent back a message and asked if you could consider doing a biopsy on the peritoneal carcinomatosis / omental thickening.

## 2024-01-19 NOTE — Progress Notes (Signed)
 Spoke with Samantha Booker regarding biopsy. Instructions reviewed.  Omentum biopsy scheduled for 01/26/2024  Arrive at 0730  for 0830 appointment Come into the Heart and Vascular entrance at [facility]. This entrance is located in the front of the hospital. Do not eat or drink anything after midnight Take your regularly scheduled blood pressure, pain, or seizure medication You will need a driver for this procedure  Take 81 mg aspirin 9/24 then hold it.  She continues to feel miserable. She states she just wants to feel better. She reported she cannot take opioids due to them being to strong and vomiting them up. Half an ultram was to sedating to her. She is taking tylenol  and motrin  but these are not very helpful. She has zofran  ODT but she does not feel nauseated and she has not tried this prior to taking her pain medication. She was offered dietician referral due to weight loss and she declined. She is agreeable to seeing palliative care to assist with symptom control during her diagnosis stage.

## 2024-01-20 ENCOUNTER — Encounter: Admitting: Hospice and Palliative Medicine

## 2024-01-20 ENCOUNTER — Telehealth: Payer: Self-pay | Admitting: Oncology

## 2024-01-20 NOTE — Telephone Encounter (Signed)
 Spoke with pt and confirmed MD appt 1 week after CT biopsy.

## 2024-01-21 ENCOUNTER — Ambulatory Visit
Admission: RE | Admit: 2024-01-21 | Discharge: 2024-01-21 | Disposition: A | Discharge status: Home / Self Care | Source: Ambulatory Visit | Attending: Oncology | Admitting: Oncology

## 2024-01-21 DIAGNOSIS — K8689 Other specified diseases of pancreas: Secondary | ICD-10-CM | POA: Diagnosis not present

## 2024-01-21 DIAGNOSIS — N132 Hydronephrosis with renal and ureteral calculous obstruction: Secondary | ICD-10-CM | POA: Diagnosis not present

## 2024-01-21 DIAGNOSIS — I2510 Atherosclerotic heart disease of native coronary artery without angina pectoris: Secondary | ICD-10-CM | POA: Diagnosis not present

## 2024-01-21 DIAGNOSIS — N134 Hydroureter: Secondary | ICD-10-CM | POA: Diagnosis not present

## 2024-01-21 DIAGNOSIS — R590 Localized enlarged lymph nodes: Secondary | ICD-10-CM | POA: Diagnosis not present

## 2024-01-21 MED ORDER — IOHEXOL 300 MG/ML  SOLN
60.0000 mL | Freq: Once | INTRAMUSCULAR | Status: AC | PRN
  Administered 2024-01-21: 60 mL via INTRAVENOUS

## 2024-01-21 MED ORDER — IOHEXOL 9 MG/ML PO SOLN
500.0000 mL | ORAL | Status: AC
Start: 2024-01-21 — End: 2024-01-21
  Administered 2024-01-21: 500 mL via ORAL

## 2024-01-25 ENCOUNTER — Other Ambulatory Visit (HOSPITAL_COMMUNITY): Payer: Self-pay | Admitting: Student

## 2024-01-25 ENCOUNTER — Telehealth: Payer: Self-pay

## 2024-01-25 ENCOUNTER — Inpatient Hospital Stay: Admitting: Hospice and Palliative Medicine

## 2024-01-25 ENCOUNTER — Other Ambulatory Visit: Payer: Self-pay

## 2024-01-25 ENCOUNTER — Encounter: Payer: Self-pay | Admitting: Hospice and Palliative Medicine

## 2024-01-25 ENCOUNTER — Inpatient Hospital Stay: Admission: EM | Admit: 2024-01-25 | Discharge: 2024-01-29 | DRG: 687 | Disposition: A | Discharge status: Home Health

## 2024-01-25 VITALS — BP 144/84 | HR 79 | Temp 99.1°F | Resp 16 | Wt 118.0 lb

## 2024-01-25 DIAGNOSIS — Z820 Family history of epilepsy and other diseases of the nervous system: Secondary | ICD-10-CM

## 2024-01-25 DIAGNOSIS — Z9841 Cataract extraction status, right eye: Secondary | ICD-10-CM

## 2024-01-25 DIAGNOSIS — Z79890 Hormone replacement therapy (postmenopausal): Secondary | ICD-10-CM

## 2024-01-25 DIAGNOSIS — I700 Atherosclerosis of aorta: Secondary | ICD-10-CM | POA: Diagnosis present

## 2024-01-25 DIAGNOSIS — Z87442 Personal history of urinary calculi: Secondary | ICD-10-CM

## 2024-01-25 DIAGNOSIS — R64 Cachexia: Secondary | ICD-10-CM | POA: Diagnosis present

## 2024-01-25 DIAGNOSIS — N1330 Unspecified hydronephrosis: Secondary | ICD-10-CM

## 2024-01-25 DIAGNOSIS — Z7982 Long term (current) use of aspirin: Secondary | ICD-10-CM

## 2024-01-25 DIAGNOSIS — T402X5A Adverse effect of other opioids, initial encounter: Secondary | ICD-10-CM

## 2024-01-25 DIAGNOSIS — R52 Pain, unspecified: Secondary | ICD-10-CM | POA: Diagnosis not present

## 2024-01-25 DIAGNOSIS — R54 Age-related physical debility: Secondary | ICD-10-CM | POA: Diagnosis present

## 2024-01-25 DIAGNOSIS — R109 Unspecified abdominal pain: Secondary | ICD-10-CM | POA: Diagnosis not present

## 2024-01-25 DIAGNOSIS — R1909 Other intra-abdominal and pelvic swelling, mass and lump: Secondary | ICD-10-CM | POA: Diagnosis not present

## 2024-01-25 DIAGNOSIS — C259 Malignant neoplasm of pancreas, unspecified: Secondary | ICD-10-CM | POA: Diagnosis not present

## 2024-01-25 DIAGNOSIS — Z825 Family history of asthma and other chronic lower respiratory diseases: Secondary | ICD-10-CM

## 2024-01-25 DIAGNOSIS — M5030 Other cervical disc degeneration, unspecified cervical region: Secondary | ICD-10-CM | POA: Diagnosis present

## 2024-01-25 DIAGNOSIS — R1013 Epigastric pain: Secondary | ICD-10-CM

## 2024-01-25 DIAGNOSIS — Z9842 Cataract extraction status, left eye: Secondary | ICD-10-CM

## 2024-01-25 DIAGNOSIS — C7919 Secondary malignant neoplasm of other urinary organs: Secondary | ICD-10-CM | POA: Diagnosis not present

## 2024-01-25 DIAGNOSIS — E876 Hypokalemia: Secondary | ICD-10-CM | POA: Diagnosis present

## 2024-01-25 DIAGNOSIS — Z833 Family history of diabetes mellitus: Secondary | ICD-10-CM

## 2024-01-25 DIAGNOSIS — Z803 Family history of malignant neoplasm of breast: Secondary | ICD-10-CM

## 2024-01-25 DIAGNOSIS — Z87891 Personal history of nicotine dependence: Secondary | ICD-10-CM

## 2024-01-25 DIAGNOSIS — Z515 Encounter for palliative care: Secondary | ICD-10-CM | POA: Diagnosis not present

## 2024-01-25 DIAGNOSIS — N1339 Other hydronephrosis: Secondary | ICD-10-CM | POA: Diagnosis not present

## 2024-01-25 DIAGNOSIS — R599 Enlarged lymph nodes, unspecified: Secondary | ICD-10-CM | POA: Diagnosis not present

## 2024-01-25 DIAGNOSIS — C786 Secondary malignant neoplasm of retroperitoneum and peritoneum: Secondary | ICD-10-CM | POA: Diagnosis present

## 2024-01-25 DIAGNOSIS — Z808 Family history of malignant neoplasm of other organs or systems: Secondary | ICD-10-CM

## 2024-01-25 DIAGNOSIS — Z6821 Body mass index (BMI) 21.0-21.9, adult: Secondary | ICD-10-CM

## 2024-01-25 DIAGNOSIS — E039 Hypothyroidism, unspecified: Secondary | ICD-10-CM | POA: Diagnosis present

## 2024-01-25 DIAGNOSIS — G893 Neoplasm related pain (acute) (chronic): Secondary | ICD-10-CM | POA: Diagnosis present

## 2024-01-25 DIAGNOSIS — K8689 Other specified diseases of pancreas: Principal | ICD-10-CM

## 2024-01-25 DIAGNOSIS — Z9049 Acquired absence of other specified parts of digestive tract: Secondary | ICD-10-CM

## 2024-01-25 DIAGNOSIS — Z8249 Family history of ischemic heart disease and other diseases of the circulatory system: Secondary | ICD-10-CM

## 2024-01-25 DIAGNOSIS — Z8349 Family history of other endocrine, nutritional and metabolic diseases: Secondary | ICD-10-CM

## 2024-01-25 DIAGNOSIS — Z79899 Other long term (current) drug therapy: Secondary | ICD-10-CM

## 2024-01-25 DIAGNOSIS — Z01818 Encounter for other preprocedural examination: Secondary | ICD-10-CM

## 2024-01-25 DIAGNOSIS — Z90710 Acquired absence of both cervix and uterus: Secondary | ICD-10-CM

## 2024-01-25 DIAGNOSIS — Z85828 Personal history of other malignant neoplasm of skin: Secondary | ICD-10-CM

## 2024-01-25 DIAGNOSIS — E785 Hyperlipidemia, unspecified: Secondary | ICD-10-CM | POA: Diagnosis present

## 2024-01-25 DIAGNOSIS — K869 Disease of pancreas, unspecified: Secondary | ICD-10-CM | POA: Diagnosis not present

## 2024-01-25 DIAGNOSIS — N132 Hydronephrosis with renal and ureteral calculous obstruction: Secondary | ICD-10-CM | POA: Diagnosis present

## 2024-01-25 DIAGNOSIS — Z880 Allergy status to penicillin: Secondary | ICD-10-CM

## 2024-01-25 DIAGNOSIS — R0602 Shortness of breath: Secondary | ICD-10-CM | POA: Diagnosis not present

## 2024-01-25 LAB — URINALYSIS, ROUTINE W REFLEX MICROSCOPIC
Bilirubin Urine: NEGATIVE
Glucose, UA: NEGATIVE mg/dL
Hgb urine dipstick: NEGATIVE
Ketones, ur: 20 mg/dL — AB
Nitrite: NEGATIVE
Protein, ur: NEGATIVE mg/dL
Specific Gravity, Urine: 1.011 (ref 1.005–1.030)
pH: 8 (ref 5.0–8.0)

## 2024-01-25 LAB — CBC WITH DIFFERENTIAL/PLATELET
Abs Immature Granulocytes: 0.06 K/uL (ref 0.00–0.07)
Basophils Absolute: 0.1 K/uL (ref 0.0–0.1)
Basophils Relative: 1 %
Eosinophils Absolute: 0.1 K/uL (ref 0.0–0.5)
Eosinophils Relative: 1 %
HCT: 37.7 % (ref 36.0–46.0)
Hemoglobin: 12.5 g/dL (ref 12.0–15.0)
Immature Granulocytes: 1 %
Lymphocytes Relative: 15 %
Lymphs Abs: 1.5 K/uL (ref 0.7–4.0)
MCH: 30.1 pg (ref 26.0–34.0)
MCHC: 33.2 g/dL (ref 30.0–36.0)
MCV: 90.8 fL (ref 80.0–100.0)
Monocytes Absolute: 1.2 K/uL — ABNORMAL HIGH (ref 0.1–1.0)
Monocytes Relative: 12 %
Neutro Abs: 7.2 K/uL (ref 1.7–7.7)
Neutrophils Relative %: 70 %
Platelets: 242 K/uL (ref 150–400)
RBC: 4.15 MIL/uL (ref 3.87–5.11)
RDW: 12.2 % (ref 11.5–15.5)
WBC: 10 K/uL (ref 4.0–10.5)
nRBC: 0 % (ref 0.0–0.2)

## 2024-01-25 LAB — COMPREHENSIVE METABOLIC PANEL WITH GFR
ALT: 24 U/L (ref 0–44)
AST: 24 U/L (ref 15–41)
Albumin: 3.5 g/dL (ref 3.5–5.0)
Alkaline Phosphatase: 77 U/L (ref 38–126)
Anion gap: 13 (ref 5–15)
BUN: 11 mg/dL (ref 8–23)
CO2: 23 mmol/L (ref 22–32)
Calcium: 9.1 mg/dL (ref 8.9–10.3)
Chloride: 103 mmol/L (ref 98–111)
Creatinine, Ser: 0.54 mg/dL (ref 0.44–1.00)
GFR, Estimated: >60 mL/min (ref >60)
Glucose, Bld: 127 mg/dL — ABNORMAL HIGH (ref 70–99)
Potassium: 3.3 mmol/L — ABNORMAL LOW (ref 3.5–5.1)
Sodium: 139 mmol/L (ref 135–145)
Total Bilirubin: 0.4 mg/dL (ref 0.0–1.2)
Total Protein: 6.8 g/dL (ref 6.5–8.1)

## 2024-01-25 LAB — PROTIME-INR
INR: 1 (ref 0.8–1.2)
Prothrombin Time: 14.2 s (ref 11.4–15.2)

## 2024-01-25 LAB — LIPASE, BLOOD: Lipase: 52 U/L — ABNORMAL HIGH (ref 11–51)

## 2024-01-25 LAB — APTT: aPTT: 24 s (ref 24–36)

## 2024-01-25 MED ORDER — ACETAMINOPHEN 650 MG RE SUPP: 650.0000 mg | Freq: Four times a day (QID) | RECTAL | Status: DC | PRN

## 2024-01-25 MED ORDER — HYDROMORPHONE HCL 1 MG/ML IJ SOLN
0.5000 mg | Freq: Once | INTRAMUSCULAR | Status: AC
  Administered 2024-01-25: 0.5 mg via INTRAVENOUS
  Filled 2024-01-25: qty 0.5

## 2024-01-25 MED ORDER — HYDROMORPHONE HCL 1 MG/ML IJ SOLN: 0.5000 mg | INTRAMUSCULAR | Status: DC | PRN

## 2024-01-25 MED ORDER — SODIUM CHLORIDE 0.9 % IV SOLN
12.5000 mg | Freq: Four times a day (QID) | INTRAVENOUS | Status: DC | PRN
  Administered 2024-01-25 – 2024-01-28 (×6): 12.5 mg via INTRAVENOUS
  Filled 2024-01-25 (×3): qty 12.5
  Filled 2024-01-25: qty 0.5
  Filled 2024-01-25: qty 12.5
  Filled 2024-01-25: qty 0.5

## 2024-01-25 MED ORDER — ONDANSETRON HCL 4 MG PO TABS: 4.0000 mg | ORAL_TABLET | Freq: Four times a day (QID) | ORAL | Status: DC | PRN

## 2024-01-25 MED ORDER — ONDANSETRON HCL 4 MG/2ML IJ SOLN
4.0000 mg | Freq: Once | INTRAMUSCULAR | Status: AC
  Administered 2024-01-25: 4 mg via INTRAVENOUS
  Filled 2024-01-25: qty 2

## 2024-01-25 MED ORDER — MORPHINE SULFATE (PF) 2 MG/ML IV SOLN
2.0000 mg | INTRAVENOUS | Status: DC | PRN
  Administered 2024-01-25 – 2024-01-27 (×5): 2 mg via INTRAVENOUS
  Filled 2024-01-25 (×5): qty 1

## 2024-01-25 MED ORDER — LACTATED RINGERS IV BOLUS
1000.0000 mL | Freq: Once | INTRAVENOUS | Status: AC
  Administered 2024-01-25: 1000 mL via INTRAVENOUS

## 2024-01-25 MED ORDER — MORPHINE SULFATE (PF) 4 MG/ML IV SOLN
4.0000 mg | Freq: Once | INTRAVENOUS | Status: AC
  Administered 2024-01-25: 4 mg via INTRAVENOUS
  Filled 2024-01-25: qty 1

## 2024-01-25 MED ORDER — ACETAMINOPHEN 325 MG PO TABS: 650.0000 mg | ORAL_TABLET | Freq: Four times a day (QID) | ORAL | Status: DC | PRN

## 2024-01-25 MED ORDER — KCL IN DEXTROSE-NACL 40-5-0.9 MEQ/L-%-% IV SOLN
INTRAVENOUS | Status: AC
  Filled 2024-01-25: qty 1000

## 2024-01-25 MED ORDER — ONDANSETRON HCL 4 MG/2ML IJ SOLN
4.0000 mg | Freq: Four times a day (QID) | INTRAMUSCULAR | Status: DC | PRN
  Administered 2024-01-25 – 2024-01-26 (×2): 4 mg via INTRAVENOUS
  Filled 2024-01-25 (×2): qty 2

## 2024-01-25 MED ORDER — LEVOTHYROXINE SODIUM 112 MCG PO TABS
112.0000 ug | ORAL_TABLET | Freq: Every day | ORAL | Status: DC
Start: 2024-01-26 — End: 2024-01-29
  Administered 2024-01-26 – 2024-01-29 (×4): 112 ug via ORAL
  Filled 2024-01-25 (×4): qty 1

## 2024-01-25 MED ORDER — VENLAFAXINE HCL ER 75 MG PO CP24
75.0000 mg | ORAL_CAPSULE | Freq: Every day | ORAL | Status: DC
Start: 2024-01-26 — End: 2024-01-29
  Administered 2024-01-26 – 2024-01-29 (×4): 75 mg via ORAL
  Filled 2024-01-25 (×4): qty 1

## 2024-01-25 NOTE — Telephone Encounter (Signed)
 Call returned to Ms. Samantha Booker for a voicemail left over the weekend and this morning regarding needing help for pain. She was made an appointment last week for palliative care and cancelled. She was again offered an appointment with palliative care or symptom management today and she stated nevermind and disconnected the phone call. Attempted to call spouse back and received voicemail.

## 2024-01-25 NOTE — ED Notes (Signed)
 EDP, [place] at beside.

## 2024-01-25 NOTE — Assessment & Plan Note (Signed)
 Intolerance/intractable vomiting with oral opioids and therapeutic use, initial encounter CT diagnosis (01/21/2024) of metastatic pancreatic cancer-omental and retroperitoneal The ER patient tolerated morphine with prior Zofran  administration Continue morphine/Dilaudid  for pain with prior antiemetics, Zofran  with Phenergan  for breakthrough IR consult for celiac plexus block for pain management-per oncology notes IR consult for possible biopsy as well-defer to oncology Antiemetics, IV hydration and supportive care

## 2024-01-25 NOTE — ED Triage Notes (Signed)
 Pt arrives via POV with c/o abdominal pain that started a  week ago. Pt states they were dx with pancreatic cancer and they are having a biopsy in the morning. They were sent here from the cancer center to be admitted for pain control because pt is unable to take oral pain medicine. Pt is A&Ox4 during triage and tearful.

## 2024-01-25 NOTE — Progress Notes (Signed)
 Palliative Medicine [facility] at [facility] [phone] [phone]   Name: Samantha Booker Date: 01/25/2024 MRN: [id number]  DOB: [birth year]  Patient Care Team: Auston Reyes BIRCH, MD as PCP - General (Internal Medicine) Babara Call, MD as Consulting Physician (Oncology)    REASON FOR CONSULTATION: Samantha Booker is a 73 y.o. female with multiple medical problems including pancreatic tail mass and peritoneal/omental nodularity concerning for pancreatic cancer with metastasis.  Patient has had significant abdominal pain.  She was referred to palliative care to address goals and manage ongoing symptoms.  SOCIAL HISTORY:     reports that she quit smoking about 32 years ago. Her smoking use included cigarettes. She started smoking about 52 years ago. She has a 40 pack-year smoking history. She has never used smokeless tobacco. She reports current alcohol use of about 2.0 standard drinks of alcohol per week. She reports that she does not use drugs.  Patient is married lives at home with her husband  ADVANCE DIRECTIVES:    CODE STATUS:   PAST MEDICAL HISTORY: Past Medical History:  Diagnosis Date   Actinic keratosis 10/30/2021   R forearm LN2 05/15/2022   Actinic keratosis 05/15/2022   left upper arm, treated with LN2 05/20/2022   Atherosclerosis of aorta 06/25/2016   Imaging Feb 2018   DDD (degenerative disc disease), cervical    Foliclar lymph grade III, unsp, nodes of head, face, and nk (HCC)    Hepatitis 1972   Hep B   History of basal cell carcinoma (BCC) 10/30/2021   right popliteal fossa BCC NODULAR PATTERN, BASE INVOLVED  treated with ED&C   History of hepatitis B    History of kidney stones    in past   Hyperlipidemia    Hypothyroidism    Pneumonia    as child    PAST SURGICAL HISTORY:  Past Surgical History:  Procedure Laterality Date   ANTERIOR AND POSTERIOR REPAIR WITH SACROSPINOUS FIXATION N/A 01/12/2023    Procedure: ANTERIOR AND POSTERIOR REPAIR WITH SACROSPINOUS FIXATION;  Surgeon: Marilynne Rosaline SAILOR, MD;  Location: [facility];  Service: Gynecology;  Laterality: N/A;  Total time requested is 1.5 hrs   APPENDECTOMY  1979   BOTOX  INJECTION N/A 09/24/2021   Procedure: BOTOX  INJECTION;  Surgeon: Rodolph Romano, MD;  Location: [facility];  Service: General;  Laterality: N/A;   CATARACT EXTRACTION Bilateral 2019   CHOLECYSTECTOMY  2011   COLONOSCOPY WITH PROPOFOL  N/A 05/20/2018   Procedure: COLONOSCOPY WITH PROPOFOL ;  Surgeon: Unk Corinn Skiff, MD;  Location: [facility];  Service: Gastroenterology;  Laterality: N/A;   CYSTOSCOPY  01/12/2023   Procedure: CYSTOSCOPY;  Surgeon: Marilynne Rosaline SAILOR, MD;  Location: [facility];  Service: Gynecology;;   ESOPHAGOGASTRODUODENOSCOPY (EGD) WITH PROPOFOL  N/A 07/21/2019   Procedure: ESOPHAGOGASTRODUODENOSCOPY (EGD) WITH PROPOFOL ;  Surgeon: Unk Corinn Skiff, MD;  Location: [facility];  Service: Endoscopy;  Laterality: N/A;   EYE SURGERY Left 10/05/2017   HEMORRHOID SURGERY N/A 09/24/2021   Procedure: HEMORRHOIDECTOMY;  Surgeon: Rodolph Romano, MD;  Location: [facility];  Service: General;  Laterality: N/A;   INGUINAL HERNIA REPAIR Left 12/15/2018   Procedure: LAPAROSCOPIC LEFT INGUINAL HERNIA VS OPEN;  Surgeon: Rodolph Romano, MD;  Location: [facility];  Service: General;  Laterality: Left;   LAPAROSCOPIC UNILATERAL SALPINGO OOPHERECTOMY Right 12/15/2018   Procedure: LAPAROSCOPIC UNILATERAL OOPHORECTOMY;  Surgeon: Ward, Mitzie BROCKS, MD;  Location: [facility];  Service: Gynecology;  Laterality: Right;  PERINEOPLASTY N/A 01/12/2023   Procedure: PERINEOPLASTY;  Surgeon: Marilynne Rosaline SAILOR, MD;  Location: [facility];  Service: Gynecology;  Laterality: N/A;   ROOT CANAL     suburethral sling  2012   Dr. Barbaraann   VAGINAL HYSTERECTOMY  1980   precancerous cells     HEMATOLOGY/ONCOLOGY HISTORY:  Oncology History   No history exists.    ALLERGIES:  is allergic to penicillins.  MEDICATIONS:  Current Outpatient Medications  Medication Sig Dispense Refill   acetaminophen  (TYLENOL ) 500 MG tablet Take 1 tablet (500 mg total) by mouth every 6 (six) hours as needed (pain). 30 tablet 0   acyclovir cream (ZOVIRAX) 5 % Apply 1 application topically every 4 (four) hours as needed (for cold sores).      atorvastatin  (LIPITOR) 40 MG tablet Take 1 tablet (40 mg total) by mouth at bedtime. 90 tablet 1   BIOTIN PO Take 1 tablet by mouth daily.     celecoxib  (CELEBREX ) 200 MG capsule Take 200 mg by mouth 2 (two) times daily.     cholecalciferol (VITAMIN D3) 25 MCG (1000 UT) tablet Take 1,000 Units by mouth daily.     levothyroxine  (SYNTHROID ) 112 MCG tablet Take 112 mcg by mouth daily before breakfast.     LORazepam (ATIVAN) 0.5 MG tablet Take 0.5 mg by mouth 2 (two) times daily as needed.     pregabalin (LYRICA) 75 MG capsule Take 75 mg by mouth 2 (two) times daily.     venlafaxine XR (EFFEXOR-XR) 75 MG 24 hr capsule Take by mouth.     aspirin EC 81 MG tablet Take 1 tablet (81 mg total) by mouth daily. (Patient not taking: Reported on 01/25/2024)     estradiol  (ESTRACE ) 0.1 MG/GM vaginal cream Place 0.5g nightly for two weeks then twice a week after (Patient not taking: Reported on 01/25/2024) 30 g 11   hydrocortisone  (ANUSOL -HC) 2.5 % rectal cream Place 1 application rectally 2 (two) times daily. (Patient not taking: Reported on 01/25/2024) 30 g 1   ibuprofen  (ADVIL ) 600 MG tablet Take 1 tablet (600 mg total) by mouth every 6 (six) hours as needed. (Patient not taking: Reported on 01/25/2024) 30 tablet 0   PREMARIN  1.25 MG tablet Take 1.25 mg by mouth daily. Takes 1/4 of 1.25 mg daily (Patient not taking: Reported on 01/25/2024)     No current facility-administered medications for this visit.    VITAL SIGNS: BP (!) 144/84   Pulse 79   Temp 99.1 F (37.3 C)  (Tympanic)   Resp 16   Wt 118 lb (53.5 kg)   BMI 20.25 kg/m  Filed Weights   01/25/24 1359  Weight: 118 lb (53.5 kg)    Estimated body mass index is 20.25 kg/m as calculated from the following:   Height as of 01/10/24: 5' 4 (1.626 m).   Weight as of this encounter: 118 lb (53.5 kg).  LABS: CBC:    Component Value Date/Time   WBC 9.7 01/18/2024 1605   HGB 12.8 01/18/2024 1605   HGB 13.6 06/10/2016 0828   HCT 38.0 01/18/2024 1605   HCT 40.7 06/10/2016 0828   PLT 278 01/18/2024 1605   PLT 238 06/10/2016 0828   MCV 91.1 01/18/2024 1605   MCV 92 06/10/2016 0828   NEUTROABS 6.4 01/18/2024 1605   NEUTROABS 4.6 06/10/2016 0828   LYMPHSABS 2.1 01/18/2024 1605   LYMPHSABS 2.2 06/10/2016 0828   MONOABS 0.8 01/18/2024 1605   EOSABS 0.2 01/18/2024 1605  EOSABS 0.3 06/10/2016 0828   BASOSABS 0.1 01/18/2024 1605   BASOSABS 0.1 06/10/2016 0828   Comprehensive Metabolic Panel:    Component Value Date/Time   NA 134 (L) 01/18/2024 1605   NA 144 06/10/2016 0828   K 4.0 01/18/2024 1605   CL 100 01/18/2024 1605   CO2 25 01/18/2024 1605   BUN 16 01/18/2024 1605   BUN 11 06/10/2016 0828   CREATININE 0.68 01/18/2024 1605   CREATININE 0.63 06/24/2018 1146   GLUCOSE 87 01/18/2024 1605   CALCIUM  9.5 01/18/2024 1605   AST 30 01/18/2024 1605   ALT 26 01/18/2024 1605   ALKPHOS 86 01/18/2024 1605   BILITOT 0.5 01/18/2024 1605   BILITOT 0.3 06/10/2016 0828   PROT 7.1 01/18/2024 1605   PROT 6.4 06/10/2016 0828   ALBUMIN 4.1 01/18/2024 1605   ALBUMIN 4.1 06/10/2016 0828    RADIOGRAPHIC STUDIES: CT CHEST ABDOMEN PELVIS W CONTRAST Result Date: 01/25/2024 CLINICAL DATA:  Pancreatic mass, abdominal pain and bloating * Tracking Code: BO * EXAM: CT CHEST, ABDOMEN, AND PELVIS WITH CONTRAST TECHNIQUE: Multidetector CT imaging of the chest, abdomen and pelvis was performed following the standard protocol during bolus administration of intravenous contrast. RADIATION DOSE REDUCTION: This exam was  performed according to the departmental dose-optimization program which includes automated exposure control, adjustment of the mA and/or kV according to patient size and/or use of iterative reconstruction technique. CONTRAST:  60mL OMNIPAQUE  IOHEXOL  300 MG/ML SOLN additional oral enteric contrast COMPARISON:  CT abdomen pelvis 01/12/2024 FINDINGS: CT CHEST FINDINGS Cardiovascular: Aortic atherosclerosis. Normal heart size. Three-vessel coronary artery calcifications. No pericardial effusion. Mediastinum/Nodes: No enlarged mediastinal, hilar, or axillary lymph nodes. Trachea and esophagus demonstrate no significant findings. Lungs/Pleura: Occasional small bilateral pulmonary nodules, measuring up to 0.3 cm in the peripheral superior segment right lower lobe (series 3, image 71). Background of extensive fine centrilobular nodularity throughout the lungs. Bandlike scarring of the bilateral lung bases. No pleural effusion or pneumothorax. Musculoskeletal: No chest wall abnormality. No acute osseous findings. CT ABDOMEN PELVIS FINDINGS Hepatobiliary: No solid liver abnormality is seen. Hepatic steatosis. Cholecystectomy. No biliary ductal dilatation. Pancreas: Hypoenhancing mass centered in the pancreatic body and proximal tail measuring 5.0 x 3.8 cm (series 2, image 69). No pancreatic ductal dilatation or surrounding inflammatory changes. Spleen: Normal in size without significant abnormality. Adrenals/Urinary Tract: Adrenal glands are unremarkable. Severe right hydronephrosis and hydroureter, the distal ureter presumably obstructed by metastatic disease, although nidus of obstruction not directly visualized. No calculi. No left-sided hydronephrosis. Small nonobstructive bilateral renal calculi. Bladder is unremarkable. Stomach/Bowel: Stomach is within normal limits. Appendix appears normal. No evidence of bowel wall thickening, distention, or inflammatory changes. Vascular/Lymphatic: Aortic atherosclerosis. Matted  lymphadenopathy or soft tissue mass in the gastrohepatic ligament closely abutting the lesser curvature of the stomach, measuring 3.6 x 3.4 cm (series 2, image 66). Enlarged retroperitoneal lymph nodes measuring up to 1.5 x 1.3 cm (series 2, image 62). Reproductive: No mass or other abnormality. Other: No abdominal wall hernia or abnormality. Small volume ascites. Diffuse peritoneal nodularity with dense omental caking in the ventral abdomen. Musculoskeletal: No acute osseous findings. IMPRESSION: 1. Hypoenhancing mass centered in the pancreatic body and proximal tail measuring 5.0 x 3.8 cm, consistent with primary pancreatic adenocarcinoma. 2. Matted lymphadenopathy or soft tissue mass in the gastrohepatic ligament closely abutting the lesser curvature of the stomach, measuring 3.6 x 3.4 cm. Enlarged retroperitoneal lymph nodes measuring up to 1.5 x 1.3 cm. 3. Diffuse peritoneal nodularity with dense omental caking in  the ventral abdomen, consistent with peritoneal carcinomatosis. 4. Severe right hydronephrosis and hydroureter, the distal ureter presumably obstructed by metastatic disease, although nidus of obstruction not directly visualized. 5. Occasional small bilateral pulmonary nodules, measuring up to 0.3 cm in the peripheral superior segment right lower lobe, nonspecific although suspicious for small pulmonary metastases. Attention on follow-up. 6. Hepatic steatosis. 7. Coronary artery disease. Aortic Atherosclerosis (ICD10-I70.0). Electronically Signed   By: Marolyn JONETTA Jaksch M.D.   On: 01/25/2024 13:35   CT ABDOMEN PELVIS WO CONTRAST Result Date: 01/12/2024 CLINICAL DATA:  Abdominal pain EXAM: CT ABDOMEN AND PELVIS WITHOUT CONTRAST TECHNIQUE: Multidetector CT imaging of the abdomen and pelvis was performed following the standard protocol without IV contrast. RADIATION DOSE REDUCTION: This exam was performed according to the departmental dose-optimization program which includes automated exposure control,  adjustment of the mA and/or kV according to patient size and/or use of iterative reconstruction technique. COMPARISON:  10/22/2018 FINDINGS: Lower chest: No acute abnormality Hepatobiliary: No focal liver abnormality is seen. Status post cholecystectomy. No biliary dilatation. Pancreas: Pancreatic body mass suspected measuring 4.1 x 2.8 cm with truncation/atrophy of the pancreatic tail beyond the mass. This is a new appearance when compared to prior study. No ductal dilatation. No surrounding inflammation. Spleen: No focal abnormality.  Normal size. Adrenals/Urinary Tract: Moderate right hydronephrosis and hydroureter. No visible obstructing stones. Area of cortical thinning/scarring in the mid to lower pole of the right kidney with parenchymal calcification, stable since prior study. Punctate nonobstructing left lower pole stone. No hydronephrosis on the left. Adrenal glands and urinary bladder unremarkable. Stomach/Bowel: Stomach, large and small bowel grossly unremarkable. Vascular/Lymphatic: Heavily calcified aorta and iliac vessels. No evidence of aneurysm or adenopathy. Reproductive: Prior hysterectomy.  No adnexal masses. Other: Free fluid in the pelvis. No free air. There are peritoneal nodules noted in the anterior and left abdomen. Omental thickening/nodularity noted. Musculoskeletal: No acute or suspicious focal osseous abnormality. Scoliosis and degenerative changes in the lumbar spine. IMPRESSION: Masslike area in the pancreatic body measuring 4.1 x 2.8 cm with atrophy of the pancreatic tail. Findings concerning for pancreatic mass/neoplasm. Recommend further evaluation with MRI. 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 Small to mild free fluid in the pelvis. Right hydronephrosis and hydroureter without visible obstructing stone. Left nephrolithiasis. Aortoiliac atherosclerosis. Electronically Signed   By: Franky Crease M.D.   On: 01/12/2024 13:34     PERFORMANCE STATUS (ECOG) : 3 - Symptomatic, >50% confined to bed  Review of Systems Unless otherwise noted, a complete review of systems is negative.  Physical Exam General: Thin, frail-appearing Cardiovascular: regular rate and rhythm Pulmonary: clear ant fields Abdomen: soft, tender GU: no suprapubic tenderness Extremities: no edema, no joint deformities Skin: no rashes Neurological: Weakness but otherwise nonfocal  IMPRESSION: Patient presents to clinic today accompanied by her husband.  Patient severely tearful and endorses intractable pain in the upper abdomen.  Patient is not currently taking any pain medications other than acetaminophen  and ibuprofen .  These are not controlling her symptoms.  She has refused opioid pain medications due to history of intolerance.  Husband (who is a retired Teacher, early years/pre) says that patient has history of severe nausea and vomiting from opioid pain medications.  Several weeks ago, he tried her on a half a tablet of tramadol, but that also caused nausea and vomiting.  We discussed possible option of outpatient management but given the severity of her pain, I advised the patient might be better served being admitted to the hospital for pain  crisis and having her pain regimen established in a more controlled setting.  Hospitalization would also facilitate biopsy, which was scheduled for tomorrow.  Her hydronephrosis can also be addressed by urology consultation.  Additionally, I spoke with IR about the feasibility of a celiac plexus block, which might allow pain to be addressed interventionally and opioids can be minimized or avoided.  I reviewed the results of CT scan with patient and husband.  Unfortunately, CT of the chest abdomen pelvis today showed hyperenhancing mass in the pancreatic body consistent with primary pancreatic adenocarcinoma.  Patient also has retroperitoneal lymphadenopathy and diffuse omental caking consistent with peritoneal  carcinomatosis.  She has severe right sided hydroureteronephrosis and also with small bilateral pulmonary nodules.  We discussed that workup including imaging and CA 19-9 of nearly 15,000 almost certainly reflect stage IV pancreatic cancer and that her prognosis is poor.  Patient says that she still desires biopsy and is interested in discussing treatment options.  Unclear if she will be able to tolerate the emetogenic effects of chemotherapy given her history of intolerance to other medications.  PLAN: - Patient sent to the ED due to intractable abdominal pain - Please consult IR for biopsy and celiac plexus block  Case and plan discussed with Dr. Babara   Time Total: 30 minutes  Visit consisted of counseling and education dealing with the complex and emotionally intense issues of symptom management and palliative care in the setting of serious and potentially life-threatening illness.Greater than 50%  of this time was spent counseling and coordinating care related to the above assessment and plan.  Signed by: Fonda Mower, PhD, NP-C

## 2024-01-25 NOTE — ED Provider Notes (Signed)
 [facility] Provider Note    Event Date/Time   First MD Initiated Contact with Patient 01/25/24 1640     (approximate)   History   Chief Complaint Pain Management   HPI  Samantha Booker is a 73 y.o. female with a past medical history of hyperlipidemia and pancreatic mass who presents to the ED complaining of abdominal pain.  Patient reports that she has been dealing with increasing pain across much of her abdomen, most severe in the epigastric area, for about the past month.  Pain has been constant and gradually increasing in severity, was recently diagnosed with pancreatic mass with signs of metastasis.  She has been following with oncology and palliative care for this problem, is now at the point that she is unable to tolerate the pain.  She has been taking Tylenol  without relief, states she cannot take oral opiates due to nausea and vomiting.  She denies any nausea or vomiting currently, has had some diarrhea recently.  She reports some dysuria but denies any fevers.  She was sent to the ED by palliative care for admission for pain control.     Physical Exam   Triage Vital Signs: ED Triage Vitals  Encounter Vitals Group     BP 01/25/24 1531 (!) 161/90     Girls Systolic BP Percentile --      Girls Diastolic BP Percentile --      Boys Systolic BP Percentile --      Boys Diastolic BP Percentile --      Pulse Rate 01/25/24 1531 80     Resp 01/25/24 1531 (!) 21     Temp 01/25/24 1531 99 F (37.2 C)     Temp Source 01/25/24 1531 Oral     SpO2 01/25/24 1531 100 %     Weight 01/25/24 1534 118 lb (53.5 kg)     Height 01/25/24 1534 5' 2 (1.575 m)     Head Circumference --      Peak Flow --      Pain Score 01/25/24 1532 10     Pain Loc --      Pain Education --      Exclude from Growth Chart --     Most recent vital signs: Vitals:   01/25/24 1531  BP: (!) 161/90  Pulse: 80  Resp: (!) 21  Temp: 99 F (37.2 C)  SpO2: 100%    Constitutional:  Alert and oriented. Eyes: Conjunctivae are normal. Head: Atraumatic. Nose: No congestion/rhinnorhea. Mouth/Throat: Mucous membranes are moist.  Cardiovascular: Normal rate, regular rhythm. Grossly normal heart sounds.  2+ radial pulses bilaterally. Respiratory: Normal respiratory effort.  No retractions. Lungs CTAB. Gastrointestinal: Soft and diffusely tender to palpation, greatest in the epigastrium. Musculoskeletal: No lower extremity tenderness nor edema.  Neurologic:  Normal speech and language. No gross focal neurologic deficits are appreciated.    ED Results / Procedures / Treatments   Labs (all labs ordered are listed, but only abnormal results are displayed) Labs Reviewed  CBC WITH DIFFERENTIAL/PLATELET - Abnormal; Notable for the following components:      Result Value   Monocytes Absolute 1.2 (*)    All other components within normal limits  COMPREHENSIVE METABOLIC PANEL WITH GFR - Abnormal; Notable for the following components:   Potassium 3.3 (*)    Glucose, Bld 127 (*)    All other components within normal limits  URINALYSIS, ROUTINE W REFLEX MICROSCOPIC - Abnormal; Notable for the following components:  Color, Urine YELLOW (*)    APPearance HAZY (*)    Ketones, ur 20 (*)    Leukocytes,Ua TRACE (*)    Bacteria, UA RARE (*)    All other components within normal limits  PROTIME-INR  APTT  LIPASE, BLOOD     PROCEDURES:  Critical Care performed: No  Procedures   MEDICATIONS ORDERED IN ED: Medications  HYDROmorphone  (DILAUDID ) injection 0.5 mg (has no administration in time range)  morphine (PF) 4 MG/ML injection 4 mg (4 mg Intravenous Given 01/25/24 1738)  ondansetron  (ZOFRAN ) injection 4 mg (4 mg Intravenous Given 01/25/24 1736)  lactated ringers  bolus 1,000 mL (0 mLs Intravenous Stopped 01/25/24 1918)     IMPRESSION / MDM / ASSESSMENT AND PLAN / ED COURSE  I reviewed the triage vital signs and the nursing notes.                              73 y.o.  female with past medical history of hyperlipidemia and pancreatic mass who presents to the ED complaining of 1 month of increasing abdominal pain and recent diagnosis of pancreatic mass.  Patient's presentation is most consistent with acute presentation with potential threat to life or bodily function.  Differential diagnosis includes, but is not limited to, cancer related pain, urinary obstruction, kidney stone, UTI, bowel obstruction, dehydration, electrolyte abnormality, AKI, pancreatitis, hepatitis.  Patient uncomfortable but nontoxic-appearing and in no acute distress, vital signs are unremarkable.  Her abdomen is soft but diffusely tender to palpation, greatest in the epigastrium.  I reviewed her CT imaging from 4 days ago, which shows pancreatic mass as well as signs of metastasis with severe right-sided hydronephrosis.  Hydronephrosis likely contributing to her pain, do not feel repeat CT imaging needed at this time but will check urinalysis.  Lab results are also pending, will treat symptomatically with IV morphine and Zofran , hydrate with IV fluids.  Pain improving on reassessment, labs without significant anemia, leukocytosis, electrolyte abnormality, or AKI.  LFTs are unremarkable and urinalysis with no signs of infection.  Patient would likely benefit from stenting of her right ureter, but does not need to be emergent given no signs of infection.  Case discussed with hospitalist for admission, will keep n.p.o. after midnight for planned biopsy tomorrow.      FINAL CLINICAL IMPRESSION(S) / ED DIAGNOSES   Final diagnoses:  Pancreatic mass  Epigastric pain     Rx / DC Orders   ED Discharge Orders     None        Note:  This document was prepared using Dragon voice recognition software and may include unintentional dictation errors.   Willo Dunnings, MD 01/25/24 [phone]

## 2024-01-25 NOTE — Assessment & Plan Note (Addendum)
 Continue home levothyroxine  as tolerated

## 2024-01-25 NOTE — H&P (Signed)
 History and Physical    Patient: Samantha Booker FMW:[id number] DOB: 24-Nov-1950 DOA: 01/25/2024 DOS: the patient was seen and examined on 01/25/2024 PCP: Auston Reyes BIRCH, MD  Patient coming from: Home  Chief Complaint:  Chief Complaint  Patient presents with   Pain Management    HPI: Samantha Booker is a 73 y.o. 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  She initially presented to her PCP with a 1 month history of worsening abdominal pain.    CT imaging on 9/25 also showed severe right hydronephrosis and hydroureter presumably obstructed by metastatic disease versus other.  Small nonobstructive bilateral renal calculi was seen.She is opioid intolerant with symptoms of nausea and vomiting so her pain has been very difficult to control. In the ED BP 161/90 with mild tachypnea to 21 but otherwise normal vitals. Labs including CBC, CMP and UA notable for hypokalemia of 3.3 but otherwise unremarkable. Patient was treated with LR 1 L bolus x 2, morphine with Zofran  Admission requested     Past Medical History:  Diagnosis Date   Actinic keratosis 10/30/2021   R forearm LN2 05/15/2022   Actinic keratosis 05/15/2022   left upper arm, treated with LN2 05/20/2022   Atherosclerosis of aorta 06/25/2016   Imaging Feb 2018   DDD (degenerative disc disease), cervical    Foliclar lymph grade III, unsp, nodes of head, face, and nk (HCC)    Hepatitis 1972   Hep B   History of basal cell carcinoma (BCC) 10/30/2021   right popliteal fossa BCC NODULAR PATTERN, BASE INVOLVED  treated with ED&C   History of hepatitis B    History of kidney stones    in past   Hyperlipidemia    Hypothyroidism    Pneumonia    as child   Past Surgical History:  Procedure Laterality Date    ANTERIOR AND POSTERIOR REPAIR WITH SACROSPINOUS FIXATION N/A 01/12/2023   Procedure: ANTERIOR AND POSTERIOR REPAIR WITH SACROSPINOUS FIXATION;  Surgeon: Marilynne Rosaline SAILOR, MD;  Location: [facility];  Service: Gynecology;  Laterality: N/A;  Total time requested is 1.5 hrs   APPENDECTOMY  1979   BOTOX  INJECTION N/A 09/24/2021   Procedure: BOTOX  INJECTION;  Surgeon: Rodolph Romano, MD;  Location: [facility];  Service: General;  Laterality: N/A;   CATARACT EXTRACTION Bilateral 2019   CHOLECYSTECTOMY  2011   COLONOSCOPY WITH PROPOFOL  N/A 05/20/2018   Procedure: COLONOSCOPY WITH PROPOFOL ;  Surgeon: Unk Corinn Skiff, MD;  Location: [facility];  Service: Gastroenterology;  Laterality: N/A;   CYSTOSCOPY  01/12/2023   Procedure: CYSTOSCOPY;  Surgeon: Marilynne Rosaline SAILOR, MD;  Location: [facility];  Service: Gynecology;;   ESOPHAGOGASTRODUODENOSCOPY (EGD) WITH PROPOFOL  N/A 07/21/2019   Procedure: ESOPHAGOGASTRODUODENOSCOPY (EGD) WITH PROPOFOL ;  Surgeon: Unk Corinn Skiff, MD;  Location: [facility];  Service: Endoscopy;  Laterality: N/A;   EYE SURGERY Left 10/05/2017   HEMORRHOID SURGERY N/A 09/24/2021   Procedure: HEMORRHOIDECTOMY;  Surgeon: Rodolph Romano, MD;  Location: [facility];  Service: General;  Laterality: N/A;   INGUINAL HERNIA REPAIR Left 12/15/2018   Procedure: LAPAROSCOPIC LEFT INGUINAL HERNIA VS OPEN;  Surgeon: Rodolph Romano, MD;  Location: [facility];  Service: General;  Laterality: Left;   LAPAROSCOPIC UNILATERAL SALPINGO OOPHERECTOMY Right 12/15/2018   Procedure: LAPAROSCOPIC UNILATERAL OOPHORECTOMY;  Surgeon: Ward,  Mitzie BROCKS, MD;  Location: [facility];  Service: Gynecology;  Laterality: Right;   PERINEOPLASTY N/A 01/12/2023   Procedure: PERINEOPLASTY;  Surgeon: Marilynne Rosaline SAILOR, MD;  Location: [facility];  Service: Gynecology;  Laterality: N/A;   ROOT CANAL     suburethral sling  2012   Dr.  Barbaraann   VAGINAL HYSTERECTOMY  1980   precancerous cells   Social History:  reports that she quit smoking about 32 years ago. Her smoking use included cigarettes. She started smoking about 52 years ago. She has a 40 pack-year smoking history. She has never used smokeless tobacco. She reports current alcohol use of about 2.0 standard drinks of alcohol per week. She reports that she does not use drugs.  Allergies  Allergen Reactions   Penicillins Other (See Comments)    Ineffective for patient Did it involve swelling of the face/tongue/throat, SOB, or low BP? No Did it involve sudden or severe rash/hives, skin peeling, or any reaction on the inside of your mouth or nose? No Did you need to seek medical attention at a hospital or doctor's office? No When did it last happen?NEVER EXPERIENCED ANY REACTION FROM PENICILLIN     If all above answers are NO, may proceed with cephalosporin use.     Family History  Problem Relation Age of Onset   Diabetes Mother    Heart disease Mother        CABG 2007   Hyperlipidemia Mother    COPD Mother    Hyperlipidemia Father    Heart disease Father    Parkinson's disease Brother    Cancer Maternal Grandmother        breast   Cancer Maternal Aunt        breast   Cancer Cousin        brain   Cancer Cousin        melanoma   Cancer Daughter        breast cancer    Prior to Admission medications   Medication Sig Start Date End Date Taking? Authorizing Provider  acetaminophen  (TYLENOL ) 500 MG tablet Take 1 tablet (500 mg total) by mouth every 6 (six) hours as needed (pain). 12/24/22   Zuleta, Kaitlin G, NP  acyclovir cream (ZOVIRAX) 5 % Apply 1 application topically every 4 (four) hours as needed (for cold sores).  06/01/18   [provider]  aspirin EC 81 MG tablet Take 1 tablet (81 mg total) by mouth daily. Patient not taking: Reported on 01/25/2024 07/10/17   Hinton Newell SQUIBB, MD  atorvastatin  (LIPITOR) 40 MG tablet Take 1 tablet (40 mg  total) by mouth at bedtime. 06/25/18   Hinton Newell SQUIBB, MD  BIOTIN PO Take 1 tablet by mouth daily.    [provider]  celecoxib  (CELEBREX ) 200 MG capsule Take 200 mg by mouth 2 (two) times daily.    [provider]  cholecalciferol (VITAMIN D3) 25 MCG (1000 UT) tablet Take 1,000 Units by mouth daily.    [provider]  estradiol  (ESTRACE ) 0.1 MG/GM vaginal cream Place 0.5g nightly for two weeks then twice a week after Patient not taking: Reported on 01/25/2024 02/26/23   Marilynne Rosaline SAILOR, MD  hydrocortisone  (ANUSOL -HC) 2.5 % rectal cream Place 1 application rectally 2 (two) times daily. Patient not taking: Reported on 01/25/2024 08/24/19   Vanga, Rohini Reddy, MD  ibuprofen  (ADVIL ) 600 MG tablet Take 1 tablet (600 mg total) by mouth every 6 (six) hours as needed. Patient not  taking: Reported on 01/25/2024 12/24/22   Zuleta, Kaitlin G, NP  levothyroxine  (SYNTHROID ) 112 MCG tablet Take 112 mcg by mouth daily before breakfast. 10/19/18   [provider]  LORazepam (ATIVAN) 0.5 MG tablet Take 0.5 mg by mouth 2 (two) times daily as needed. 04/13/19   [provider]  pregabalin (LYRICA) 75 MG capsule Take 75 mg by mouth 2 (two) times daily.    [provider]  PREMARIN  1.25 MG tablet Take 1.25 mg by mouth daily. Takes 1/4 of 1.25 mg daily Patient not taking: Reported on 01/25/2024 04/13/19   [provider]  venlafaxine XR (EFFEXOR-XR) 75 MG 24 hr capsule Take by mouth. 03/31/22 01/25/24  [provider]    Physical Exam: Vitals:   01/25/24 1531 01/25/24 1534 01/25/24 1930  BP: (!) 161/90  (!) 170/93  Pulse: 80  77  Resp: (!) 21  13  Temp: 99 F (37.2 C)    TempSrc: Oral    SpO2: 100%  96%  Weight:  53.5 kg   Height:  5' 2 (1.575 m)    Physical Exam Vitals and nursing note reviewed.  Constitutional:      General: She is not in acute distress.    Comments: Frail-appearing female  HENT:     Head: Normocephalic and  atraumatic.  Cardiovascular:     Rate and Rhythm: Normal rate and regular rhythm.     Heart sounds: Normal heart sounds.  Pulmonary:     Effort: Pulmonary effort is normal.     Breath sounds: Normal breath sounds.  Abdominal:     Palpations: Abdomen is soft.     Tenderness: There is no abdominal tenderness.  Neurological:     Mental Status: Mental status is at baseline.     Labs on Admission: I have personally reviewed following labs and imaging studies  CBC: Recent Labs  Lab 01/25/24 1718  WBC 10.0  NEUTROABS 7.2  HGB 12.5  HCT 37.7  MCV 90.8  PLT 242   Basic Metabolic Panel: Recent Labs  Lab 01/25/24 1718  NA 139  K 3.3*  CL 103  CO2 23  GLUCOSE 127*  BUN 11  CREATININE 0.54  CALCIUM  9.1   GFR: Estimated Creatinine Clearance: 48.8 mL/min (by C-G formula based on SCr of 0.54 mg/dL). Liver Function Tests: Recent Labs  Lab 01/25/24 1718  AST 24  ALT 24  ALKPHOS 77  BILITOT 0.4  PROT 6.8  ALBUMIN 3.5   No results for input(s): LIPASE, AMYLASE in the last 168 hours. No results for input(s): AMMONIA in the last 168 hours. Coagulation Profile: Recent Labs  Lab 01/25/24 1718  INR 1.0   Cardiac Enzymes: No results for input(s): CKTOTAL, CKMB, CKMBINDEX, TROPONINI in the last 168 hours. BNP (last 3 results) No results for input(s): PROBNP in the last 8760 hours. HbA1C: No results for input(s): HGBA1C in the last 72 hours. CBG: No results for input(s): GLUCAP in the last 168 hours. Lipid Profile: No results for input(s): CHOL, HDL, LDLCALC, TRIG, CHOLHDL, LDLDIRECT in the last 72 hours. Thyroid  Function Tests: No results for input(s): TSH, T4TOTAL, FREET4, T3FREE, THYROIDAB in the last 72 hours. Anemia Panel: No results for input(s): VITAMINB12, FOLATE, FERRITIN, TIBC, IRON, RETICCTPCT in the last 72 hours. Urine analysis:    Component Value Date/Time   COLORURINE YELLOW (A) 01/25/2024 1718    APPEARANCEUR HAZY (A) 01/25/2024 1718   LABSPEC 1.011 01/25/2024 1718   PHURINE 8.0 01/25/2024 1718   GLUCOSEU NEGATIVE  01/25/2024 1718   HGBUR NEGATIVE 01/25/2024 1718   BILIRUBINUR NEGATIVE 01/25/2024 1718   BILIRUBINUR small (A) 01/11/2024 1305   BILIRUBINUR negative 06/26/2022 1009   KETONESUR 20 (A) 01/25/2024 1718   PROTEINUR NEGATIVE 01/25/2024 1718   UROBILINOGEN 0.2 01/11/2024 1305   NITRITE NEGATIVE 01/25/2024 1718   LEUKOCYTESUR TRACE (A) 01/25/2024 1718    Radiological Exams on Admission: No results found. Data Reviewed for HPI: Relevant notes from primary care and specialist visits, past discharge summaries as available in EHR, including Care Everywhere. Prior diagnostic testing as pertinent to current admission diagnoses Updated medications and problem lists for reconciliation ED course, including vitals, labs, imaging, treatment and response to treatment Triage notes, nursing and pharmacy notes and ED provider's notes Notable results as noted above in HPI      Assessment and Plan: * Intractable cancer related abdominal pain Intolerance/intractable vomiting with oral opioids and therapeutic use, initial encounter CT diagnosis (01/21/2024) of metastatic pancreatic cancer-omental and retroperitoneal The ER patient tolerated morphine with prior Zofran  administration Continue morphine/Dilaudid  for pain with prior antiemetics, Zofran  with Phenergan  for breakthrough IR consult for celiac plexus block for pain management-per oncology notes IR consult for possible biopsy as well-defer to oncology Antiemetics, IV hydration and supportive care  Hydronephrosis of right kidney Hydronephrosis and hydroureter presumably obstructed by metastatic disease Urology consult recommended by oncology for possible stent placement N.p.o. from midnight as above  Hypothyroidism Continue home levothyroxine  as tolerated     DVT prophylaxis: SCD  Consults: IR and urology  Advance  Care Planning:   Code Status: Prior   Family Communication: none  Disposition Plan: Back to previous home environment  Severity of Illness: The appropriate patient status for this patient is OBSERVATION. Observation status is judged to be reasonable and necessary in order to provide the required intensity of service to ensure the patient's safety. The patient's presenting symptoms, physical exam findings, and initial radiographic and laboratory data in the context of their medical condition is felt to place them at decreased risk for further clinical deterioration. Furthermore, it is anticipated that the patient will be medically stable for discharge from the hospital within 2 midnights of admission.   Author: Delayne LULLA Solian, MD 01/25/2024 7:37 PM  For on call review [email or web address].

## 2024-01-25 NOTE — ED Notes (Signed)
Report given to Ally, RN 

## 2024-01-25 NOTE — Assessment & Plan Note (Signed)
 Hydronephrosis and hydroureter presumably obstructed by metastatic disease Urology consult recommended by oncology for possible stent placement N.p.o. from midnight as above

## 2024-01-25 NOTE — Telephone Encounter (Signed)
 Mr. Dershem returned call and I made him aware that Ms. Baik can be seen today in the cancer center. He asked why she should be seen and I reminded him of the voicemail he left Sunday regarding her pain and that she needs help. If they do not want to come to the cancer center, if she is in extreme pain, then a visit to the ED may be beneficial. They do not want to return to the ED. I have again offered an appointment today which she has accepted.

## 2024-01-26 ENCOUNTER — Observation Stay

## 2024-01-26 ENCOUNTER — Inpatient Hospital Stay: Admitting: Radiology

## 2024-01-26 ENCOUNTER — Ambulatory Visit: Admission: RE | Admit: 2024-01-26 | Source: Ambulatory Visit

## 2024-01-26 DIAGNOSIS — C259 Malignant neoplasm of pancreas, unspecified: Secondary | ICD-10-CM

## 2024-01-26 DIAGNOSIS — R54 Age-related physical debility: Secondary | ICD-10-CM | POA: Diagnosis not present

## 2024-01-26 DIAGNOSIS — Z85828 Personal history of other malignant neoplasm of skin: Secondary | ICD-10-CM | POA: Diagnosis not present

## 2024-01-26 DIAGNOSIS — Z79890 Hormone replacement therapy (postmenopausal): Secondary | ICD-10-CM | POA: Diagnosis not present

## 2024-01-26 DIAGNOSIS — R1013 Epigastric pain: Secondary | ICD-10-CM | POA: Diagnosis present

## 2024-01-26 DIAGNOSIS — Z87442 Personal history of urinary calculi: Secondary | ICD-10-CM | POA: Diagnosis not present

## 2024-01-26 DIAGNOSIS — Z820 Family history of epilepsy and other diseases of the nervous system: Secondary | ICD-10-CM | POA: Diagnosis not present

## 2024-01-26 DIAGNOSIS — E876 Hypokalemia: Secondary | ICD-10-CM | POA: Diagnosis not present

## 2024-01-26 DIAGNOSIS — Z7982 Long term (current) use of aspirin: Secondary | ICD-10-CM | POA: Diagnosis not present

## 2024-01-26 DIAGNOSIS — N1330 Unspecified hydronephrosis: Secondary | ICD-10-CM | POA: Diagnosis not present

## 2024-01-26 DIAGNOSIS — Z8249 Family history of ischemic heart disease and other diseases of the circulatory system: Secondary | ICD-10-CM | POA: Diagnosis not present

## 2024-01-26 DIAGNOSIS — I700 Atherosclerosis of aorta: Secondary | ICD-10-CM | POA: Diagnosis not present

## 2024-01-26 DIAGNOSIS — Z9049 Acquired absence of other specified parts of digestive tract: Secondary | ICD-10-CM | POA: Diagnosis not present

## 2024-01-26 DIAGNOSIS — T402X5A Adverse effect of other opioids, initial encounter: Secondary | ICD-10-CM

## 2024-01-26 DIAGNOSIS — R52 Pain, unspecified: Secondary | ICD-10-CM | POA: Diagnosis not present

## 2024-01-26 DIAGNOSIS — R109 Unspecified abdominal pain: Secondary | ICD-10-CM

## 2024-01-26 DIAGNOSIS — R64 Cachexia: Secondary | ICD-10-CM | POA: Diagnosis not present

## 2024-01-26 DIAGNOSIS — Z8349 Family history of other endocrine, nutritional and metabolic diseases: Secondary | ICD-10-CM | POA: Diagnosis not present

## 2024-01-26 DIAGNOSIS — G893 Neoplasm related pain (acute) (chronic): Secondary | ICD-10-CM | POA: Diagnosis not present

## 2024-01-26 DIAGNOSIS — Z87891 Personal history of nicotine dependence: Secondary | ICD-10-CM | POA: Diagnosis not present

## 2024-01-26 DIAGNOSIS — C786 Secondary malignant neoplasm of retroperitoneum and peritoneum: Secondary | ICD-10-CM | POA: Diagnosis not present

## 2024-01-26 DIAGNOSIS — E785 Hyperlipidemia, unspecified: Secondary | ICD-10-CM | POA: Diagnosis not present

## 2024-01-26 DIAGNOSIS — Z803 Family history of malignant neoplasm of breast: Secondary | ICD-10-CM | POA: Diagnosis not present

## 2024-01-26 DIAGNOSIS — Z808 Family history of malignant neoplasm of other organs or systems: Secondary | ICD-10-CM | POA: Diagnosis not present

## 2024-01-26 DIAGNOSIS — C7919 Secondary malignant neoplasm of other urinary organs: Secondary | ICD-10-CM | POA: Diagnosis not present

## 2024-01-26 DIAGNOSIS — N132 Hydronephrosis with renal and ureteral calculous obstruction: Secondary | ICD-10-CM | POA: Diagnosis not present

## 2024-01-26 DIAGNOSIS — Z833 Family history of diabetes mellitus: Secondary | ICD-10-CM | POA: Diagnosis not present

## 2024-01-26 DIAGNOSIS — Z825 Family history of asthma and other chronic lower respiratory diseases: Secondary | ICD-10-CM | POA: Diagnosis not present

## 2024-01-26 DIAGNOSIS — N1339 Other hydronephrosis: Secondary | ICD-10-CM | POA: Diagnosis not present

## 2024-01-26 DIAGNOSIS — E039 Hypothyroidism, unspecified: Secondary | ICD-10-CM | POA: Diagnosis not present

## 2024-01-26 HISTORY — PX: IR NEPHROSTOMY PLACEMENT RIGHT: IMG6064

## 2024-01-26 MED ORDER — MIDAZOLAM HCL 2 MG/2ML IJ SOLN
INTRAMUSCULAR | Status: AC | PRN
  Administered 2024-01-26: .5 mg via INTRAVENOUS

## 2024-01-26 MED ORDER — ENSURE PLUS HIGH PROTEIN PO LIQD
237.0000 mL | Freq: Two times a day (BID) | ORAL | Status: DC
  Administered 2024-01-28 – 2024-01-29 (×2): 237 mL via ORAL

## 2024-01-26 MED ORDER — FENTANYL CITRATE (PF) 100 MCG/2ML IJ SOLN
INTRAMUSCULAR | Status: AC | PRN
  Administered 2024-01-26: 50 ug via INTRAVENOUS

## 2024-01-26 MED ORDER — MIDAZOLAM HCL 2 MG/2ML IJ SOLN
INTRAMUSCULAR | Status: AC
  Filled 2024-01-26: qty 2

## 2024-01-26 MED ORDER — FENTANYL CITRATE (PF) 100 MCG/2ML IJ SOLN
INTRAMUSCULAR | Status: AC
  Filled 2024-01-26: qty 2

## 2024-01-26 MED ORDER — LIDOCAINE HCL 1 % IJ SOLN
10.0000 mL | Freq: Once | INTRAMUSCULAR | Status: AC
  Administered 2024-01-26: 9 mL via INTRADERMAL
  Filled 2024-01-26: qty 10

## 2024-01-26 MED ORDER — MIDAZOLAM HCL 5 MG/5ML IJ SOLN
INTRAMUSCULAR | Status: AC | PRN
  Administered 2024-01-26: 1 mg via INTRAVENOUS

## 2024-01-26 MED ORDER — FENTANYL CITRATE (PF) 100 MCG/2ML IJ SOLN
INTRAMUSCULAR | Status: AC | PRN
  Administered 2024-01-26: 25 ug via INTRAVENOUS

## 2024-01-26 MED ORDER — LEVOFLOXACIN IN D5W 500 MG/100ML IV SOLN
INTRAVENOUS | Status: AC | PRN
  Administered 2024-01-26: 500 mg via INTRAVENOUS

## 2024-01-26 MED ORDER — IOHEXOL 300 MG/ML  SOLN
10.0000 mL | Freq: Once | INTRAMUSCULAR | Status: AC | PRN
  Administered 2024-01-26: 10 mL

## 2024-01-26 MED ORDER — BUPIVACAINE HCL (PF) 0.5 % IJ SOLN
5.0000 mL | INTRAMUSCULAR | Status: AC
  Filled 2024-01-26: qty 10

## 2024-01-26 MED ORDER — SODIUM CHLORIDE 0.9 % IV SOLN: INTRAVENOUS | Status: AC

## 2024-01-26 MED ORDER — SODIUM CHLORIDE 0.9% FLUSH
5.0000 mL | Freq: Three times a day (TID) | INTRAVENOUS | Status: DC
  Administered 2024-01-26 – 2024-01-29 (×8): 5 mL

## 2024-01-26 MED ORDER — LIDOCAINE HCL 1 % IJ SOLN
INTRAMUSCULAR | Status: AC
  Filled 2024-01-26: qty 20

## 2024-01-26 MED ORDER — LEVOFLOXACIN IN D5W 500 MG/100ML IV SOLN
500.0000 mg | INTRAVENOUS | Status: AC
  Filled 2024-01-26: qty 100

## 2024-01-26 MED ORDER — ONDANSETRON HCL 4 MG/2ML IJ SOLN
INTRAMUSCULAR | Status: AC
  Filled 2024-01-26: qty 2

## 2024-01-26 MED ORDER — ONDANSETRON HCL 4 MG/2ML IJ SOLN
INTRAMUSCULAR | Status: AC | PRN
  Administered 2024-01-26: 4 mg via INTRAVENOUS

## 2024-01-26 NOTE — Progress Notes (Signed)
 PROGRESS NOTE    Samantha Booker  FMW:[id number] DOB: [birth year] DOA: 01/25/2024 PCP: Auston Reyes BIRCH, MD   Brief Narrative:   73 y.o. 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, sent from the cancer center for management of intractable abdominal pain as well as IR consult for consideration of celiac plexus block   9/30: Urology c/s, IR -> Fluoro Guided PCN placement on right & CT Guided Biopsy of omental mass. Plan for Celiac Plexus Block tomorrow   Assessment & Plan:   Principal Problem:   Intractable cancer related abdominal pain Active Problems:   Intolerance/Intractable vomiting with oral opioids in therapeutic use, initial encounter   CT diagnosis (01/21/2024) of primary pancreatic cancer with omental and retroperitoneal metastases (HCC)   Hydronephrosis of right kidney   Hypothyroidism   * Intractable cancer related abdominal pain Intolerance/intractable vomiting with oral opioids and therapeutic use, initial encounter CT diagnosis (01/21/2024) of metastatic pancreatic cancer-omental and retroperitoneal Continue morphine for pain with prior antiemetics, Zofran  with Phenergan  for breakthrough IR consult - s/p Fluoro Guided PCN placement on right & CT Guided Biopsy of omental mass today. Plan for Celiac Plexus Block tomorrow NPO after MN   Hydronephrosis of right kidney Hydronephrosis and hydroureter presumably obstructed by metastatic disease Urology consult appreciated. S/p Fluoro Guided PCN placement on right by IR Continue nephrostomy tube for drainage   Hypothyroidism Continue home levothyroxine  as tolerated  Hypokalemia Replete and recheck Pharmacy c/s for electrolytes     DVT prophylaxis: SCD's      Code Status: Full code Family Communication: husband updated at bedside  Disposition Plan: possible DC in 1-2 days depending on clinical condition and IR procedure  tomorrow   Consultants:  Urology IR  Procedures:  Fluoro Guided PCN placement on right & CT Guided Biopsy of omental mass today. Plan for Celiac Plexus Block tomorrow  Antimicrobials:  Levaquin    Subjective:  Pain controlled with meds, appreciative of her care and happy to hear that she will get celiac plexus block done tomorrow. Husband at bedside  Objective: Vitals:   01/26/24 1200 01/26/24 1315 01/26/24 1455 01/26/24 2012  BP: (!) 152/88 (!) 147/88 (!) 168/83 (!) 163/78  Pulse: 66 68 71 70  Resp: 18 18 19 16   Temp:  97.8 F (36.6 C) 97.9 F (36.6 C) 97.6 F (36.4 C)  TempSrc:   Oral   SpO2: 99% 100% 99% 98%  Weight:      Height:        Intake/Output Summary (Last 24 hours) at 01/26/2024 2103 Last data filed at 01/26/2024 1900 Gross per 24 hour  Intake 434.97 ml  Output 150 ml  Net 284.97 ml   Filed Weights   01/25/24 1534  Weight: 53.5 kg    Examination:  General exam: Appears calm and comfortable, frail and cachectic looking Respiratory system: Clear to auscultation. Respiratory effort normal. Cardiovascular system: S1 & S2 heard, RRR. No JVD, murmurs, rubs, gallops or clicks. No pedal edema. Gastrointestinal system: Abdomen is soft, benign Central nervous system: Alert and oriented. No focal neurological deficits. Extremities: Symmetric 5 x 5 power. Skin: No rashes, lesions or ulcers Psychiatry: Judgement and insight appear normal. Mood & affect appropriate.     Data Reviewed: I have personally reviewed following labs and imaging studies  CBC: Recent Labs  Lab 01/25/24 1718  WBC 10.0  NEUTROABS 7.2  HGB 12.5  HCT 37.7  MCV 90.8  PLT 242  Basic Metabolic Panel: Recent Labs  Lab 01/25/24 1718  NA 139  K 3.3*  CL 103  CO2 23  GLUCOSE 127*  BUN 11  CREATININE 0.54  CALCIUM  9.1   GFR: Estimated Creatinine Clearance: 48.8 mL/min (by C-G formula based on SCr of 0.54 mg/dL). Liver Function Tests: Recent Labs  Lab 01/25/24 1718   AST 24  ALT 24  ALKPHOS 77  BILITOT 0.4  PROT 6.8  ALBUMIN 3.5   Recent Labs  Lab 01/25/24 1718  LIPASE 52*   No results for input(s): AMMONIA in the last 168 hours. Coagulation Profile: Recent Labs  Lab 01/25/24 1718  INR 1.0       Radiology Studies: IR NEPHROSTOMY PLACEMENT RIGHT Result Date: 01/26/2024 INDICATION: Right-sided hydronephrosis in a patient with metastatic pancreatic cancer. Increasing abdominal pain which may be related to urinary obstruction. EXAM: Nephrostomy tube placement COMPARISON:  None Available. ANESTHESIA/SEDATION: Moderate (conscious) sedation was employed during this procedure. A total of Versed  0.5 mg and Fentanyl  25 mcg was administered intravenously by the radiology nurse. Total intra-service moderate Sedation Time: 10 minutes. The patient's level of consciousness and vital signs were monitored continuously by radiology nursing throughout the procedure under my direct supervision. CONTRAST:  10 mL Omnipaque  300-administered into the collecting system(s) FLUOROSCOPY: Radiation Exposure Index (as provided by the fluoroscopic device): 5 mGy Kerma COMPLICATIONS: None immediate. PROCEDURE: Informed written consent was obtained from the patient after a thorough discussion of the procedural risks, benefits and alternatives. All questions were addressed. Maximal Sterile Barrier Technique was utilized including caps, mask, sterile gowns, sterile gloves, sterile drape, hand hygiene and skin antiseptic. A timeout was performed prior to the initiation of the procedure. In a prone position on the IR table, the right flank region was prepped and draped in usual sterile fashion. Local anesthesia was achieved by infiltrating subcutaneous tissue to the level of the renal capsule with 1 percent lidocaine  under ultrasound guidance. A small incision was then made in the right flank region. Through the incision an Accustick needle was advanced with ultrasound guidance from the  skin to the renal capsule and into a lower pole calyx. Calyx and renal pelvis are moderately dilated on ultrasound corresponding with findings from the recent CT of the abdomen and pelvis. The stylet was removed and clear urine was obtained at the hub of the needle. Small volume of contrast was injected demonstrating needle position within the renal collecting system. An 018 guidewire was then advanced under fluoroscopy and coiled in the renal pelvis. Access was then exchanged over the guidewire for an Accustick introducer set. The stiffener and introducer were then removed as well as the guidewire and a short Amplatz wire was advanced under fluoroscopic guidance. The sheath was then removed and the access site was dilated using a 10 [place] dilator. A 10 French percutaneous nephrostomy tube was then advanced over the guidewire and into position within the renal pelvis as the stiffener and guidewire were retrieved. Retention suture and sterile dressing applied. Catheter connected to gravity drainage. IMPRESSION: Satisfactory placement of a right-sided percutaneous nephrostomy tube (10 [place]). The patient will return in approximately 8 weeks for routine exchange. Electronically Signed   By: Cordella Banner   On: 01/26/2024 14:31   CT ABDOMINAL MASS BIOPSY Result Date: 01/26/2024 INDICATION: The patient has multiple mesenteric lymph nodes, pancreatic tail mass, and significant bilateral omental thickening. EXAM: CT-guided biopsy TECHNIQUE: Multidetector CT imaging of the abdomen was performed following the standard protocol without IV contrast. RADIATION DOSE  REDUCTION: This exam was performed according to the departmental dose-optimization program which includes automated exposure control, adjustment of the mA and/or kV according to patient size and/or use of iterative reconstruction technique. MEDICATIONS: None. ANESTHESIA/SEDATION: Moderate (conscious) sedation was employed during this procedure. A total of  Versed  1 mg and Fentanyl  50 mcg was administered intravenously by the radiology nurse. Total intra-service moderate Sedation Time: 15 minutes. The patient's level of consciousness and vital signs were monitored continuously by radiology nursing throughout the procedure under my direct supervision. COMPLICATIONS: None immediate. PROCEDURE: Informed written consent was obtained from the patient after a thorough discussion of the procedural risks, benefits and alternatives. All questions were addressed. Maximal Sterile Barrier Technique was utilized including caps, mask, sterile gowns, sterile gloves, sterile drape, hand hygiene and skin antiseptic. A timeout was performed prior to the initiation of the procedure. In a supine position with radiopaque markers on the abdomen, initial axial images of the abdomen were obtained. Images were compared to the previous study of the abdomen and measurements were then obtained using the expected target region. The patient's skin was then prepped and draped in usual sterile fashion. Local anesthesia was achieved by infiltrating subcutaneous tissue to the rectus muscle with 1% lidocaine . A 17 gauge trocar was then advanced from the skin incision through the abdomen rectus oriented towards the omental thickening on the left side. Two 18 gauge samples were then obtained using the BioPince needle. Further imaging was obtained and the trocar was repositioned and a third and final 18 gauge sample was obtained. Samples placed in formalin and sent to pathology. All needles withdrawn from the patient and sterile dressing applied. IMPRESSION: Satisfactory CT-guided omental mass biopsy as described above. Electronically Signed   By: Cordella Banner   On: 01/26/2024 13:56        Scheduled Meds:  [START ON 01/27/2024] bupivacaine (PF)  5 mL Infiltration to XRAY   feeding supplement  237 mL Oral BID BM   levothyroxine   112 mcg Oral QAC breakfast   sodium chloride  flush  5 mL  Intracatheter Q8H   venlafaxine XR  75 mg Oral Q breakfast   Continuous Infusions:  sodium chloride  40 mL/hr at 01/26/24 1549   levofloxacin (LEVAQUIN) IV     promethazine  (PHENERGAN ) injection (IM or IVPB) 12.5 mg (01/26/24 1938)     LOS: 0 days    Time spent: 35 mins    Samantha Yahr Maree, MD [facility] Pager [phone]  If 7PM-7AM, please contact night-coverage [email or web address]  01/26/2024, 9:03 PM

## 2024-01-26 NOTE — Consult Note (Signed)
 Urology Consult  I have been asked to see the patient by Dr. Maree, for evaluation and management of right hydronephrosis.  Chief Complaint: abdominal pain  History of Present Illness: Samantha Booker is a 73 y.o. year old female with recently diagnosed metastatic pancreatic cancer admitted overnight with intractable abdominal pain.  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  Admission labs notable for creatinine 0.54, at baseline; white count 10.0; and UA with no pyuria, no hematuria, and rare bacteria.  She is s/p right PCN placement and omental mass biopsy with Dr. Jenna earlier today. Right PCN in place draining pink urine.  She reports significant improvement in her pain today, though still some persistent epigastric pain. She wonders if she will ever be able to remove the nephrostomy tube. She is accompanied by her husband at the bedside.  Past Medical History:  Diagnosis Date   Actinic keratosis 10/30/2021   R forearm LN2 05/15/2022   Actinic keratosis 05/15/2022   left upper arm, treated with LN2 05/20/2022   Atherosclerosis of aorta 06/25/2016   Imaging Feb 2018   DDD (degenerative disc disease), cervical    Foliclar lymph grade III, unsp, nodes of head, face, and nk (HCC)    Hepatitis 1972   Hep B   History of basal cell carcinoma (BCC) 10/30/2021   right popliteal fossa BCC NODULAR PATTERN, BASE INVOLVED  treated with ED&C   History of hepatitis B    History of kidney stones    in past   Hyperlipidemia    Hypothyroidism    Pneumonia    as child    Past Surgical History:  Procedure Laterality Date   ANTERIOR AND POSTERIOR REPAIR WITH SACROSPINOUS FIXATION N/A 01/12/2023   Procedure: ANTERIOR AND POSTERIOR REPAIR WITH SACROSPINOUS FIXATION;  Surgeon: Marilynne Rosaline SAILOR, MD;  Location: [facility];  Service: Gynecology;  Laterality:  N/A;  Total time requested is 1.5 hrs   APPENDECTOMY  1979   BOTOX  INJECTION N/A 09/24/2021   Procedure: BOTOX  INJECTION;  Surgeon: Rodolph Romano, MD;  Location: [facility];  Service: General;  Laterality: N/A;   CATARACT EXTRACTION Bilateral 2019   CHOLECYSTECTOMY  2011   COLONOSCOPY WITH PROPOFOL  N/A 05/20/2018   Procedure: COLONOSCOPY WITH PROPOFOL ;  Surgeon: Unk Corinn Skiff, MD;  Location: [facility];  Service: Gastroenterology;  Laterality: N/A;   CYSTOSCOPY  01/12/2023   Procedure: CYSTOSCOPY;  Surgeon: Marilynne Rosaline SAILOR, MD;  Location: [facility];  Service: Gynecology;;   ESOPHAGOGASTRODUODENOSCOPY (EGD) WITH PROPOFOL  N/A 07/21/2019   Procedure: ESOPHAGOGASTRODUODENOSCOPY (EGD) WITH PROPOFOL ;  Surgeon: Unk Corinn Skiff, MD;  Location: [facility];  Service: Endoscopy;  Laterality: N/A;   EYE SURGERY Left 10/05/2017   HEMORRHOID SURGERY N/A 09/24/2021   Procedure: HEMORRHOIDECTOMY;  Surgeon: Rodolph Romano, MD;  Location: [facility];  Service: General;  Laterality: N/A;   INGUINAL HERNIA REPAIR Left 12/15/2018   Procedure: LAPAROSCOPIC LEFT INGUINAL HERNIA VS OPEN;  Surgeon: Rodolph Romano, MD;  Location: [facility];  Service: General;  Laterality: Left;   LAPAROSCOPIC UNILATERAL SALPINGO OOPHERECTOMY Right 12/15/2018   Procedure: LAPAROSCOPIC UNILATERAL OOPHORECTOMY;  Surgeon: Ward, Mitzie BROCKS, MD;  Location: [facility];  Service: Gynecology;  Laterality: Right;   PERINEOPLASTY N/A 01/12/2023   Procedure: PERINEOPLASTY;  Surgeon: Marilynne Rosaline SAILOR, MD;  Location: [facility];  Service: Gynecology;  Laterality: N/A;   ROOT CANAL  suburethral sling  2012   Dr. Barbaraann   VAGINAL HYSTERECTOMY  1980   precancerous cells    Home Medications:  Current Meds  Medication Sig   acetaminophen  (TYLENOL ) 500 MG tablet Take 1 tablet (500 mg total) by mouth every 6 (six) hours as needed (pain).   atorvastatin  (LIPITOR) 40  MG tablet Take 1 tablet (40 mg total) by mouth at bedtime.   BIOTIN PO Take 1 tablet by mouth daily.   celecoxib  (CELEBREX ) 200 MG capsule Take 200 mg by mouth 2 (two) times daily.   cholecalciferol (VITAMIN D3) 25 MCG (1000 UT) tablet Take 1,000 Units by mouth daily.   levothyroxine  (SYNTHROID ) 100 MCG tablet Take 100 mcg by mouth daily before breakfast.   pregabalin (LYRICA) 75 MG capsule Take 75 mg by mouth 2 (two) times daily.   PREMARIN  1.25 MG tablet Take 1.25 mg by mouth daily. Takes 1/4 of 1.25 mg daily   venlafaxine XR (EFFEXOR-XR) 75 MG 24 hr capsule Take by mouth.    Allergies:  Allergies  Allergen Reactions   Penicillins Other (See Comments)    Ineffective for patient Did it involve swelling of the face/tongue/throat, SOB, or low BP? No Did it involve sudden or severe rash/hives, skin peeling, or any reaction on the inside of your mouth or nose? No Did you need to seek medical attention at a hospital or doctor's office? No When did it last happen?NEVER EXPERIENCED ANY REACTION FROM PENICILLIN     If all above answers are NO, may proceed with cephalosporin use.     Family History  Problem Relation Age of Onset   Diabetes Mother    Heart disease Mother        CABG 2007   Hyperlipidemia Mother    COPD Mother    Hyperlipidemia Father    Heart disease Father    Parkinson's disease Brother    Cancer Maternal Grandmother        breast   Cancer Maternal Aunt        breast   Cancer Cousin        brain   Cancer Cousin        melanoma   Cancer Daughter        breast cancer    Social History:  reports that she quit smoking about 32 years ago. Her smoking use included cigarettes. She started smoking about 52 years ago. She has a 40 pack-year smoking history. She has never used smokeless tobacco. She reports current alcohol use of about 2.0 standard drinks of alcohol per week. She reports that she does not use drugs.  ROS: A complete review of systems was performed.   All systems are negative except for pertinent findings as noted.  Physical Exam:  Vital signs in last 24 hours: Temp:  [97.5 F (36.4 C)-99.1 F (37.3 C)] 97.8 F (36.6 C) (09/30 1315) Pulse Rate:  [65-80] 68 (09/30 1315) Resp:  [11-22] 18 (09/30 1315) BP: (144-174)/(72-118) 147/88 (09/30 1315) SpO2:  [96 %-100 %] 100 % (09/30 1315) Weight:  [53.5 kg] 53.5 kg (09/29 1534) Constitutional:  Alert and oriented, no acute distress HEENT: [place] AT, moist mucus membranes Cardiovascular: No clubbing, cyanosis, or edema Respiratory: Normal respiratory effort Skin: No rashes, bruises or suspicious lesions Neurologic: Grossly intact, no focal deficits, moving all 4 extremities Psychiatric: Normal mood and affect  Laboratory Data:  Recent Labs    01/25/24 1718  WBC 10.0  HGB 12.5  HCT 37.7   Recent Labs  01/25/24 1718  NA 139  K 3.3*  CL 103  CO2 23  GLUCOSE 127*  BUN 11  CREATININE 0.54  CALCIUM  9.1   Recent Labs    01/25/24 1718  INR 1.0   Urinalysis    Component Value Date/Time   COLORURINE YELLOW (A) 01/25/2024 1718   APPEARANCEUR HAZY (A) 01/25/2024 1718   LABSPEC 1.011 01/25/2024 1718   PHURINE 8.0 01/25/2024 1718   GLUCOSEU NEGATIVE 01/25/2024 1718   HGBUR NEGATIVE 01/25/2024 1718   BILIRUBINUR NEGATIVE 01/25/2024 1718   BILIRUBINUR small (A) 01/11/2024 1305   BILIRUBINUR negative 06/26/2022 1009   KETONESUR 20 (A) 01/25/2024 1718   PROTEINUR NEGATIVE 01/25/2024 1718   UROBILINOGEN 0.2 01/11/2024 1305   NITRITE NEGATIVE 01/25/2024 1718   LEUKOCYTESUR TRACE (A) 01/25/2024 1718   Results for orders placed or performed during the hospital encounter of 07/19/19  SARS CORONAVIRUS 2 (TAT 6-24 HRS) Nasopharyngeal Nasopharyngeal Swab     Status: None   Collection Time: 07/19/19 11:01 AM   Specimen: Nasopharyngeal Swab  Result Value Ref Range Status   SARS Coronavirus 2 NEGATIVE NEGATIVE Final    Comment: (NOTE) SARS-CoV-2 target nucleic acids are NOT  DETECTED. The SARS-CoV-2 RNA is generally detectable in upper and lower respiratory specimens during the acute phase of infection. Negative results do not preclude SARS-CoV-2 infection, do not rule out co-infections with other pathogens, and should not be used as the sole basis for treatment or other patient management decisions. Negative results must be combined with clinical observations, patient history, and epidemiological information. The expected result is Negative. Fact Sheet for Patients: HairSlick.no Fact Sheet for Healthcare Providers: quierodirigir.com This test is not yet approved or cleared by the [place]  FDA and  has been authorized for detection and/or diagnosis of SARS-CoV-2 by FDA under an Emergency Use Authorization (EUA). This EUA will remain  in effect (meaning this test can be used) for the duration of the COVID-19 declaration under Section 56 4(b)(1) of the Act, 21 U.S.C. section 360bbb-3(b)(1), unless the authorization is terminated or revoked sooner. Performed at [facility], [address], [place], [place] [address]    Radiologic Imaging: No results found.  Assessment & Plan:  73 y.o. female with newly diagnosed metastatic pancreatic cancer, admitted with intractable abdominal pain.  We were consulted early this morning for consideration of ureteral stent placement, however given high likelihood of distal ureteral malignant compression versus obstruction with stent failure, we recommended engaging IR for nephrostomy tube placement instead.  This has now been performed.  Appreciate IR involvement.  I was honest with her that whether or not we can remove the nephrostomy tube in the future will depend on her clinical course, as she would need a patent ureter to allow for removal.  Recommendations: - Continue nephrostomy tube to drainage - Pain management per primary team  Thank you for  involving me in this patient's care, I will continue to follow along.  Lucie Hones, PA-C 01/26/2024 1:25 PM

## 2024-01-26 NOTE — Procedures (Signed)
 Interventional Radiology Procedure Note  Procedure: Fluoro Guided PCN placement on right   Complications: None  Estimated Blood Loss: < 10 mL  Findings: 13fr PCN placement on the right for hydronephrosis.  Samantha Booker Banner, MD

## 2024-01-26 NOTE — ED Notes (Signed)
 Pt to IR at this time

## 2024-01-26 NOTE — Plan of Care (Signed)
 Brief Interventional Radiology Note:  Patient has been approved for Celiac Plexus Block by Dr. VEAR Lent. IR will plan for tentative nerve block on 10/1 as schedule allows. NPO at midnight. 5mL sensorcaine  ordered. Consent for procedure in IR.   Thank you for allowing our service to participate in Samantha Booker 's care.  Electronically Signed: Zaeda Mcferran M Hadja Harral, PA-C 01/26/2024, 2:22 PM

## 2024-01-26 NOTE — ED Notes (Signed)
 Informed RN bed assigned

## 2024-01-26 NOTE — Progress Notes (Signed)
 Patient clinically stable post IR nephrostomy tube placement per Dr Jenna, tolerated well as well as ct biopsy omental prior to IR procedure. Received Versed  1.5 mg along with Fentanyl  75 mcg IV for both procedures total. Denies complaints post procedures. Report update given to family members at bedside post procedures. Taking po's without difficulty.

## 2024-01-26 NOTE — Progress Notes (Signed)
 Patient transported back to ER 33 post procedures/recovery with family coming to 53. Report given to care nurse with update given.

## 2024-01-26 NOTE — CV Procedure (Signed)
 Interventional Radiology Procedure Note  Procedure: CT Guided Biopsy of omental mass  Complications: None  Estimated Blood Loss: < 10 mL  Findings: 18 G core biopsy of omentum performed under CT guidance.  2 core samples obtained and sent to Pathology.  Samantha DELENA Banner, MD

## 2024-01-26 NOTE — Consult Note (Signed)
 Chief Complaint:  Peritoneal Carcinomatosis  Procedure: CT Peritoneal Mass Biopsy and Right PCN placement  Referring Provider(s): Dr. Delayne Solian  Supervising Physician: Jenna Hacker  Patient Status: [facility] - In-pt  History of Present Illness: Samantha Booker is a 73 y.o. female with a history of basal cell carcinoma and a recent finding concerning for pancreatic cancer. She presented to her PCP on 9/16 with concerns for diffuse abdominal pain with associated nausea and intermittent vomiting for the past month. CT A/P ordered which demonstrated concern for pancreatic mass and peritoneal carcinomatosis. She was originally scheduled today for outpatient omental biopsy. Unfortunately due to worsening abdominal pain, she ended up presenting to the ED yesterday afternoon at the referral of palliative care. Additional CT imaging on 9/25 also revealed severe hydronephrosis and hydroureter likely due to an obstructive met. Urology originally consulted for possible stent placement, but do not feel patient is a good candidate given her current status. IR with several requests while patient is admitted including peritoneal biopsy, right PCN placement, and celiac plexus block for pain control. Case an imaging reviewed and approved by Dr. KANDICE Jenna for omental mass biopsy and right PCN placement. Currently undergoing review for celiac plexus block.    Patient resting in bed with friend at the bedside. States that since receiving morphine for pain, her pain has been pretty well controlled allowing her to sleep most of the night. She has had a single episode of vomiting and few intermittent episodes of nausea with dry heaving since being in the hospital. She denies any chest pain, shortness of breath, fevers/chills. NPO since midnight. All questions and concerns answered at the bedside.     Patient is Full Code  Past Medical History:  Diagnosis Date   Actinic keratosis 10/30/2021   R forearm LN2  05/15/2022   Actinic keratosis 05/15/2022   left upper arm, treated with LN2 05/20/2022   Atherosclerosis of aorta 06/25/2016   Imaging Feb 2018   DDD (degenerative disc disease), cervical    Foliclar lymph grade III, unsp, nodes of head, face, and nk (HCC)    Hepatitis 1972   Hep B   History of basal cell carcinoma (BCC) 10/30/2021   right popliteal fossa BCC NODULAR PATTERN, BASE INVOLVED  treated with ED&C   History of hepatitis B    History of kidney stones    in past   Hyperlipidemia    Hypothyroidism    Pneumonia    as child    Past Surgical History:  Procedure Laterality Date   ANTERIOR AND POSTERIOR REPAIR WITH SACROSPINOUS FIXATION N/A 01/12/2023   Procedure: ANTERIOR AND POSTERIOR REPAIR WITH SACROSPINOUS FIXATION;  Surgeon: Marilynne Rosaline SAILOR, MD;  Location: [facility];  Service: Gynecology;  Laterality: N/A;  Total time requested is 1.5 hrs   APPENDECTOMY  1979   BOTOX  INJECTION N/A 09/24/2021   Procedure: BOTOX  INJECTION;  Surgeon: Rodolph Romano, MD;  Location: [facility];  Service: General;  Laterality: N/A;   CATARACT EXTRACTION Bilateral 2019   CHOLECYSTECTOMY  2011   COLONOSCOPY WITH PROPOFOL  N/A 05/20/2018   Procedure: COLONOSCOPY WITH PROPOFOL ;  Surgeon: Unk Corinn Skiff, MD;  Location: [facility];  Service: Gastroenterology;  Laterality: N/A;   CYSTOSCOPY  01/12/2023   Procedure: CYSTOSCOPY;  Surgeon: Marilynne Rosaline SAILOR, MD;  Location: [facility];  Service: Gynecology;;   ESOPHAGOGASTRODUODENOSCOPY (EGD) WITH PROPOFOL  N/A 07/21/2019   Procedure: ESOPHAGOGASTRODUODENOSCOPY (EGD) WITH PROPOFOL ;  Surgeon: Unk Corinn Skiff, MD;  Location:  [facility];  Service: Endoscopy;  Laterality: N/A;   EYE SURGERY Left 10/05/2017   HEMORRHOID SURGERY N/A 09/24/2021   Procedure: HEMORRHOIDECTOMY;  Surgeon: Rodolph Romano, MD;  Location: [facility];  Service: General;  Laterality: N/A;   INGUINAL HERNIA REPAIR  Left 12/15/2018   Procedure: LAPAROSCOPIC LEFT INGUINAL HERNIA VS OPEN;  Surgeon: Rodolph Romano, MD;  Location: [facility];  Service: General;  Laterality: Left;   LAPAROSCOPIC UNILATERAL SALPINGO OOPHERECTOMY Right 12/15/2018   Procedure: LAPAROSCOPIC UNILATERAL OOPHORECTOMY;  Surgeon: Ward, Mitzie BROCKS, MD;  Location: [facility];  Service: Gynecology;  Laterality: Right;   PERINEOPLASTY N/A 01/12/2023   Procedure: PERINEOPLASTY;  Surgeon: Marilynne Rosaline SAILOR, MD;  Location: [facility];  Service: Gynecology;  Laterality: N/A;   ROOT CANAL     suburethral sling  2012   Dr. Barbaraann   VAGINAL HYSTERECTOMY  1980   precancerous cells    Allergies: Penicillins  Medications: Prior to Admission medications   Medication Sig Start Date End Date Taking? Authorizing Provider  acetaminophen  (TYLENOL ) 500 MG tablet Take 1 tablet (500 mg total) by mouth every 6 (six) hours as needed (pain). 12/24/22  Yes Zuleta, Kaitlin G, NP  atorvastatin  (LIPITOR) 40 MG tablet Take 1 tablet (40 mg total) by mouth at bedtime. 06/25/18  Yes Lada, Newell SQUIBB, MD  BIOTIN PO Take 1 tablet by mouth daily.   Yes [provider]  celecoxib  (CELEBREX ) 200 MG capsule Take 200 mg by mouth 2 (two) times daily.   Yes [provider]  cholecalciferol (VITAMIN D3) 25 MCG (1000 UT) tablet Take 1,000 Units by mouth daily.   Yes [provider]  levothyroxine  (SYNTHROID ) 100 MCG tablet Take 100 mcg by mouth daily before breakfast.   Yes [provider]  pregabalin (LYRICA) 75 MG capsule Take 75 mg by mouth 2 (two) times daily.   Yes [provider]  PREMARIN  1.25 MG tablet Take 1.25 mg by mouth daily. Takes 1/4 of 1.25 mg daily 04/13/19  Yes [provider]  venlafaxine XR (EFFEXOR-XR) 75 MG 24 hr capsule Take by mouth. 03/31/22 01/26/24 Yes [provider]  acyclovir cream (ZOVIRAX) 5 % Apply 1 application topically every 4 (four) hours as needed (for cold  sores).  Patient not taking: Reported on 01/26/2024 06/01/18   [provider]  aspirin EC 81 MG tablet Take 1 tablet (81 mg total) by mouth daily. Patient not taking: No sig reported 07/10/17   Hinton Newell SQUIBB, MD  estradiol  (ESTRACE ) 0.1 MG/GM vaginal cream Place 0.5g nightly for two weeks then twice a week after Patient not taking: No sig reported 02/26/23   Marilynne Rosaline SAILOR, MD  hydrocortisone  (ANUSOL -HC) 2.5 % rectal cream Place 1 application rectally 2 (two) times daily. Patient not taking: No sig reported 08/24/19   Unk Corinn Skiff, MD  ibuprofen  (ADVIL ) 600 MG tablet Take 1 tablet (600 mg total) by mouth every 6 (six) hours as needed. Patient not taking: No sig reported 12/24/22   Zuleta, Kaitlin G, NP  LORazepam (ATIVAN) 0.5 MG tablet Take 0.5 mg by mouth 2 (two) times daily as needed. Patient not taking: Reported on 01/26/2024 04/13/19   [provider]     Family History  Problem Relation Age of Onset   Diabetes Mother    Heart disease Mother        CABG 2007   Hyperlipidemia Mother    COPD Mother    Hyperlipidemia Father    Heart disease Father  Parkinson's disease Brother    Cancer Maternal Grandmother        breast   Cancer Maternal Aunt        breast   Cancer Cousin        brain   Cancer Cousin        melanoma   Cancer Daughter        breast cancer    Social History   Socioeconomic History   Marital status: Married    Spouse name: Not on file   Number of children: 2   Years of education: Not on file   Highest education level: Not on file  Occupational History   Occupation: retired  Tobacco Use   Smoking status: Former    Current packs/day: 0.00    Average packs/day: 2.0 packs/day for 20.0 years (40.0 ttl pk-yrs)    Types: Cigarettes    Start date: 12/09/1971    Quit date: 12/09/1991    Years since quitting: 32.1   Smokeless tobacco: Never  Vaping Use   Vaping status: Never Used  Substance and Sexual Activity   Alcohol use:  Yes    Alcohol/week: 2.0 standard drinks of alcohol    Types: 2 Glasses of wine per week    Comment: occ   Drug use: No   Sexual activity: Yes    Partners: Male  Other Topics Concern   Not on file  Social History Narrative   Lives in [place]. Married. 2 children, 1 living in [place], 1 in [place]   Social Drivers of Health   Financial Resource Strain: Low Risk  (01/18/2024)   Overall Financial Resource Strain (CARDIA)    Difficulty of Paying Living Expenses: Not very hard  Food Insecurity: No Food Insecurity (01/18/2024)   Hunger Vital Sign    Worried About Running Out of Food in the Last Year: Never true    Ran Out of Food in the Last Year: Never true  Transportation Needs: Unknown (01/18/2024)   PRAPARE - Administrator, Civil Service (Medical): No    Lack of Transportation (Non-Medical): Not on file  Physical Activity: Not on file  Stress: No Stress Concern Present (01/18/2024)   [employer] of Occupational Health - Occupational Stress Questionnaire    Feeling of Stress: Only a little  Social Connections: Not on file     Review of Systems  Gastrointestinal:  Positive for abdominal pain, nausea and vomiting.  Patient denies any headache, chest pain, shortness of breath, or fever/chills. All other systems are negative.   Vital Signs: BP (!) 174/79 (BP Location: Left Arm)   Pulse 80   Temp 97.9 F (36.6 C) (Oral)   Resp (!) 22   Ht 5' 2 (1.575 m)   Wt 118 lb (53.5 kg)   SpO2 100%   BMI 21.58 kg/m    Physical Exam Constitutional:      Comments: tearful  HENT:     Mouth/Throat:     Mouth: Mucous membranes are moist.     Pharynx: Oropharynx is clear.  Cardiovascular:     Rate and Rhythm: Normal rate and regular rhythm.  Pulmonary:     Effort: Pulmonary effort is normal.  Abdominal:     General: Abdomen is flat.     Palpations: Abdomen is soft.     Tenderness: There is abdominal tenderness (throughout).  Musculoskeletal:        General:  Normal range of motion.  Skin:    General: Skin is warm  and dry.  Neurological:     Mental Status: She is alert and oriented to person, place, and time.  Psychiatric:        Behavior: Behavior normal.     Imaging: CT CHEST ABDOMEN PELVIS W CONTRAST Result Date: 01/25/2024 CLINICAL DATA:  Pancreatic mass, abdominal pain and bloating * Tracking Code: BO * EXAM: CT CHEST, ABDOMEN, AND PELVIS WITH CONTRAST TECHNIQUE: Multidetector CT imaging of the chest, abdomen and pelvis was performed following the standard protocol during bolus administration of intravenous contrast. RADIATION DOSE REDUCTION: This exam was performed according to the departmental dose-optimization program which includes automated exposure control, adjustment of the mA and/or kV according to patient size and/or use of iterative reconstruction technique. CONTRAST:  60mL OMNIPAQUE  IOHEXOL  300 MG/ML SOLN additional oral enteric contrast COMPARISON:  CT abdomen pelvis 01/12/2024 FINDINGS: CT CHEST FINDINGS Cardiovascular: Aortic atherosclerosis. Normal heart size. Three-vessel coronary artery calcifications. No pericardial effusion. Mediastinum/Nodes: No enlarged mediastinal, hilar, or axillary lymph nodes. Trachea and esophagus demonstrate no significant findings. Lungs/Pleura: Occasional small bilateral pulmonary nodules, measuring up to 0.3 cm in the peripheral superior segment right lower lobe (series 3, image 71). Background of extensive fine centrilobular nodularity throughout the lungs. Bandlike scarring of the bilateral lung bases. No pleural effusion or pneumothorax. Musculoskeletal: No chest wall abnormality. No acute osseous findings. CT ABDOMEN PELVIS FINDINGS Hepatobiliary: No solid liver abnormality is seen. Hepatic steatosis. Cholecystectomy. No biliary ductal dilatation. Pancreas: Hypoenhancing mass centered in the pancreatic body and proximal tail measuring 5.0 x 3.8 cm (series 2, image 69). No pancreatic ductal dilatation or  surrounding inflammatory changes. Spleen: Normal in size without significant abnormality. Adrenals/Urinary Tract: Adrenal glands are unremarkable. Severe right hydronephrosis and hydroureter, the distal ureter presumably obstructed by metastatic disease, although nidus of obstruction not directly visualized. No calculi. No left-sided hydronephrosis. Small nonobstructive bilateral renal calculi. Bladder is unremarkable. Stomach/Bowel: Stomach is within normal limits. Appendix appears normal. No evidence of bowel wall thickening, distention, or inflammatory changes. Vascular/Lymphatic: Aortic atherosclerosis. Matted lymphadenopathy or soft tissue mass in the gastrohepatic ligament closely abutting the lesser curvature of the stomach, measuring 3.6 x 3.4 cm (series 2, image 66). Enlarged retroperitoneal lymph nodes measuring up to 1.5 x 1.3 cm (series 2, image 62). Reproductive: No mass or other abnormality. Other: No abdominal wall hernia or abnormality. Small volume ascites. Diffuse peritoneal nodularity with dense omental caking in the ventral abdomen. Musculoskeletal: No acute osseous findings. IMPRESSION: 1. Hypoenhancing mass centered in the pancreatic body and proximal tail measuring 5.0 x 3.8 cm, consistent with primary pancreatic adenocarcinoma. 2. Matted lymphadenopathy or soft tissue mass in the gastrohepatic ligament closely abutting the lesser curvature of the stomach, measuring 3.6 x 3.4 cm. Enlarged retroperitoneal lymph nodes measuring up to 1.5 x 1.3 cm. 3. Diffuse peritoneal nodularity with dense omental caking in the ventral abdomen, consistent with peritoneal carcinomatosis. 4. Severe right hydronephrosis and hydroureter, the distal ureter presumably obstructed by metastatic disease, although nidus of obstruction not directly visualized. 5. Occasional small bilateral pulmonary nodules, measuring up to 0.3 cm in the peripheral superior segment right lower lobe, nonspecific although suspicious for  small pulmonary metastases. Attention on follow-up. 6. Hepatic steatosis. 7. Coronary artery disease. Aortic Atherosclerosis (ICD10-I70.0). Electronically Signed   By: Marolyn JONETTA Jaksch M.D.   On: 01/25/2024 13:35   CT ABDOMEN PELVIS WO CONTRAST Result Date: 01/12/2024 CLINICAL DATA:  Abdominal pain EXAM: CT ABDOMEN AND PELVIS WITHOUT CONTRAST TECHNIQUE: Multidetector CT imaging of the abdomen and pelvis was performed following the  standard protocol without IV contrast. RADIATION DOSE REDUCTION: This exam was performed according to the departmental dose-optimization program which includes automated exposure control, adjustment of the mA and/or kV according to patient size and/or use of iterative reconstruction technique. COMPARISON:  10/22/2018 FINDINGS: Lower chest: No acute abnormality Hepatobiliary: No focal liver abnormality is seen. Status post cholecystectomy. No biliary dilatation. Pancreas: Pancreatic body mass suspected measuring 4.1 x 2.8 cm with truncation/atrophy of the pancreatic tail beyond the mass. This is a new appearance when compared to prior study. No ductal dilatation. No surrounding inflammation. Spleen: No focal abnormality.  Normal size. Adrenals/Urinary Tract: Moderate right hydronephrosis and hydroureter. No visible obstructing stones. Area of cortical thinning/scarring in the mid to lower pole of the right kidney with parenchymal calcification, stable since prior study. Punctate nonobstructing left lower pole stone. No hydronephrosis on the left. Adrenal glands and urinary bladder unremarkable. Stomach/Bowel: Stomach, large and small bowel grossly unremarkable. Vascular/Lymphatic: Heavily calcified aorta and iliac vessels. No evidence of aneurysm or adenopathy. Reproductive: Prior hysterectomy.  No adnexal masses. Other: Free fluid in the pelvis. No free air. There are peritoneal nodules noted in the anterior and left abdomen. Omental thickening/nodularity noted. Musculoskeletal: No acute  or suspicious focal osseous abnormality. Scoliosis and degenerative changes in the lumbar spine. IMPRESSION: Masslike area in the pancreatic body measuring 4.1 x 2.8 cm with atrophy of the pancreatic tail. Findings concerning for pancreatic mass/neoplasm. Recommend further evaluation with MRI. 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 Small to mild free fluid in the pelvis. Right hydronephrosis and hydroureter without visible obstructing stone. Left nephrolithiasis. Aortoiliac atherosclerosis. Electronically Signed   By: Franky Crease M.D.   On: 01/12/2024 13:34    Labs:  CBC: Recent Labs    01/10/24 1027 01/11/24 1355 01/18/24 1605 01/25/24 1718  WBC 10.9* 10.5 9.7 10.0  HGB 14.7 14.1 12.8 12.5  HCT 45.2 41.8 38.0 37.7  PLT 282 282 278 242    COAGS: Recent Labs    01/25/24 1718  INR 1.0  APTT 24    BMP: Recent Labs    01/10/24 1027 01/11/24 1355 01/18/24 1605 01/25/24 1718  NA 138 138 134* 139  K 4.2 3.6 4.0 3.3*  CL 105 101 100 103  CO2 19* 18* 25 23  GLUCOSE 102* 99 87 127*  BUN 17 16 16 11   CALCIUM  9.6 10.5* 9.5 9.1  CREATININE 0.72 0.50 0.68 0.54  GFRNONAA >60 >60 >60 >60    LIVER FUNCTION TESTS: Recent Labs    01/10/24 1027 01/11/24 1355 01/18/24 1605 01/25/24 1718  BILITOT 0.7 0.4 0.5 0.4  AST 35 32 30 24  ALT 27 32 26 24  ALKPHOS 81 105 86 77  PROT 7.9 7.4 7.1 6.8  ALBUMIN 4.2 4.5 4.1 3.5    TUMOR MARKERS: No results for input(s): AFPTM, CEA, CA199, CHROMGRNA in the last 8760 hours.  Assessment and Plan:  Peritoneal Carcinomatosis Right Hydronephrosis secondary to obstructing mass Intractable Abdominal Pain w/ intolerance to oral opioids: KELYN PONCIANO is a 73 y.o. female with recent imaging concerning for pancreatic cancer with metastases. Imaging also with severe right hydronephrosis. Patient originally scheduled outpatient, but currently admitted for pain control. IR with request for  peritoneal mass biopsy, right PCN placement, and possible celiac plexus block. Case and imaging reviewed and approved by Dr. KANDICE Banner. Procedures to be performed under moderate sedation.  -NPO since midnight -Pain well controlled currently  -Had several discussions with the patient at the bedside  regarding her procedures -Will plan for omental mass biopsy and right PCN placement today with Dr. KANDICE Banner -Discussed with patient possible Celiac plexus block in the next few days; patient is agreeable to this plan; awaiting approval for this procedure -Will make NPO for 10/1 in the event she is approved for nerve block  Risks and benefits of omental biopsy was discussed with the patient and/or patient's family including, but not limited to bleeding, infection, damage to adjacent structures or low yield requiring additional tests.  Risks and benefits of right PCN placement was discussed with the patient including, but not limited to, infection, bleeding, significant bleeding causing loss or decrease in renal function or damage to adjacent structures.   Risks and benefits of celiac plexus block were discussed with the patient including allergic reaction, decreased blood flow to the spinal cord/paralysis, gastroparesis, nerve damage, bleeding, infection, and damage to adjacent structures.  All of the patient's questions were answered, patient is agreeable to proceed. Consent signed and in chart.   All of the patient's questions were answered, patient is agreeable to proceed.  Consent signed and in chart.   Thank you for allowing our service to participate in Samantha Booker 's care.    Electronically Signed: Glennon CHRISTELLA Bal, PA-C   01/26/2024, 8:55 AM     I spent a total of 55 Miinutes in face to face in clinical consultation, greater than 50% of which was counseling/coordinating care for peritoneal mass biopsy.

## 2024-01-26 NOTE — ED Notes (Signed)
  Pain medications administered by request of IR as premed for biopsy.

## 2024-01-26 NOTE — Plan of Care (Signed)
 Patient experiencing major pain, discomfort and nausea/vomiting.   Problem: Education: Goal: Knowledge of General Education information will improve Description: Including pain rating scale, medication(s)/side effects and non-pharmacologic comfort measures Outcome: Progressing   Problem: Clinical Measurements: Goal: Ability to maintain clinical measurements within normal limits will improve Outcome: Progressing Goal: Will remain free from infection Outcome: Progressing Goal: Diagnostic test results will improve Outcome: Progressing Goal: Respiratory complications will improve Outcome: Progressing Goal: Cardiovascular complication will be avoided Outcome: Progressing   Problem: Elimination: Goal: Will not experience complications related to urinary retention Outcome: Progressing   Problem: Safety: Goal: Ability to remain free from injury will improve Outcome: Progressing   Problem: Skin Integrity: Goal: Risk for impaired skin integrity will decrease Outcome: Progressing   Problem: Health Behavior/Discharge Planning: Goal: Ability to manage health-related needs will improve Outcome: Not Progressing   Problem: Activity: Goal: Risk for activity intolerance will decrease Outcome: Not Progressing   Problem: Nutrition: Goal: Adequate nutrition will be maintained Outcome: Not Progressing   Problem: Coping: Goal: Level of anxiety will decrease Outcome: Not Progressing   Problem: Elimination: Goal: Will not experience complications related to bowel motility Outcome: Not Progressing   Problem: Pain Managment: Goal: General experience of comfort will improve and/or be controlled Outcome: Not Progressing

## 2024-01-27 ENCOUNTER — Inpatient Hospital Stay: Attending: Oncology | Admitting: Hospice and Palliative Medicine

## 2024-01-27 ENCOUNTER — Inpatient Hospital Stay

## 2024-01-27 DIAGNOSIS — C786 Secondary malignant neoplasm of retroperitoneum and peritoneum: Secondary | ICD-10-CM | POA: Insufficient documentation

## 2024-01-27 DIAGNOSIS — Z23 Encounter for immunization: Secondary | ICD-10-CM | POA: Insufficient documentation

## 2024-01-27 DIAGNOSIS — N132 Hydronephrosis with renal and ureteral calculous obstruction: Secondary | ICD-10-CM | POA: Insufficient documentation

## 2024-01-27 DIAGNOSIS — R52 Pain, unspecified: Secondary | ICD-10-CM | POA: Diagnosis not present

## 2024-01-27 DIAGNOSIS — Z803 Family history of malignant neoplasm of breast: Secondary | ICD-10-CM | POA: Insufficient documentation

## 2024-01-27 DIAGNOSIS — Z87891 Personal history of nicotine dependence: Secondary | ICD-10-CM | POA: Insufficient documentation

## 2024-01-27 DIAGNOSIS — K8689 Other specified diseases of pancreas: Secondary | ICD-10-CM | POA: Insufficient documentation

## 2024-01-27 DIAGNOSIS — N1339 Other hydronephrosis: Secondary | ICD-10-CM

## 2024-01-27 DIAGNOSIS — Z808 Family history of malignant neoplasm of other organs or systems: Secondary | ICD-10-CM | POA: Insufficient documentation

## 2024-01-27 DIAGNOSIS — Z66 Do not resuscitate: Secondary | ICD-10-CM | POA: Insufficient documentation

## 2024-01-27 DIAGNOSIS — Z5111 Encounter for antineoplastic chemotherapy: Secondary | ICD-10-CM | POA: Insufficient documentation

## 2024-01-27 DIAGNOSIS — G893 Neoplasm related pain (acute) (chronic): Secondary | ICD-10-CM | POA: Insufficient documentation

## 2024-01-27 LAB — BASIC METABOLIC PANEL WITH GFR
Anion gap: 10 (ref 5–15)
BUN: 8 mg/dL (ref 8–23)
CO2: 23 mmol/L (ref 22–32)
Calcium: 8.3 mg/dL — ABNORMAL LOW (ref 8.9–10.3)
Chloride: 105 mmol/L (ref 98–111)
Creatinine, Ser: 0.44 mg/dL (ref 0.44–1.00)
GFR, Estimated: >60 mL/min (ref >60)
Glucose, Bld: 105 mg/dL — ABNORMAL HIGH (ref 70–99)
Potassium: 3.2 mmol/L — ABNORMAL LOW (ref 3.5–5.1)
Sodium: 138 mmol/L (ref 135–145)

## 2024-01-27 LAB — CBC
HCT: 36.1 % (ref 36.0–46.0)
Hemoglobin: 12.2 g/dL (ref 12.0–15.0)
MCH: 30.6 pg (ref 26.0–34.0)
MCHC: 33.8 g/dL (ref 30.0–36.0)
MCV: 90.5 fL (ref 80.0–100.0)
Platelets: 221 K/uL (ref 150–400)
RBC: 3.99 MIL/uL (ref 3.87–5.11)
RDW: 12.2 % (ref 11.5–15.5)
WBC: 10.1 K/uL (ref 4.0–10.5)
nRBC: 0 % (ref 0.0–0.2)

## 2024-01-27 LAB — MAGNESIUM: Magnesium: 1.7 mg/dL (ref 1.7–2.4)

## 2024-01-27 LAB — PHOSPHORUS: Phosphorus: 3 mg/dL (ref 2.5–4.6)

## 2024-01-27 LAB — SURGICAL PATHOLOGY

## 2024-01-27 MED ORDER — SODIUM CHLORIDE 0.9 % IV SOLN: INTRAVENOUS | Status: DC

## 2024-01-27 MED ORDER — POTASSIUM CHLORIDE CRYS ER 20 MEQ PO TBCR
40.0000 meq | EXTENDED_RELEASE_TABLET | Freq: Once | ORAL | Status: AC
  Administered 2024-01-27: 40 meq via ORAL
  Filled 2024-01-27: qty 2

## 2024-01-27 MED ORDER — FENTANYL CITRATE (PF) 100 MCG/2ML IJ SOLN
INTRAMUSCULAR | Status: AC | PRN
  Administered 2024-01-27 (×2): 50 ug via INTRAVENOUS

## 2024-01-27 MED ORDER — LIDOCAINE 1% INJECTION FOR CIRCUMCISION: INJECTION | INTRAVENOUS | Status: AC | PRN

## 2024-01-27 MED ORDER — IOHEXOL 300 MG/ML  SOLN
2.0000 mL | Freq: Once | INTRAMUSCULAR | Status: AC | PRN
  Administered 2024-01-27: 2 mL

## 2024-01-27 MED ORDER — MIDAZOLAM HCL 2 MG/2ML IJ SOLN
INTRAMUSCULAR | Status: AC | PRN
  Administered 2024-01-27: 1 mg via INTRAVENOUS

## 2024-01-27 MED ORDER — ALCOHOL (ABLYSINOL) 99% IA SOLN
INTRA_ARTERIAL | Status: AC | PRN
  Administered 2024-01-27: 30 mL

## 2024-01-27 MED ORDER — MIDAZOLAM HCL 2 MG/2ML IJ SOLN
INTRAMUSCULAR | Status: AC
  Filled 2024-01-27: qty 2

## 2024-01-27 MED ORDER — MAGNESIUM SULFATE 2 GM/50ML IV SOLN
2.0000 g | Freq: Once | INTRAVENOUS | Status: AC
  Administered 2024-01-27: 2 g via INTRAVENOUS
  Filled 2024-01-27: qty 50

## 2024-01-27 MED ORDER — FENTANYL CITRATE (PF) 100 MCG/2ML IJ SOLN
INTRAMUSCULAR | Status: AC
  Filled 2024-01-27: qty 2

## 2024-01-27 NOTE — Progress Notes (Signed)
 Multidisciplinary Oncology Council Documentation  Samantha Booker was presented by our [facility] on 01/27/2024, which included representatives from:  Palliative Care Dietitian  Physical/Occupational Therapist Nurse Navigator Genetics Social work Survivorship RN Financial Navigator Research RN   Samantha Booker currently presents with history of pancreatic cancer  We reviewed previous medical and familial history, history of present illness, and recent lab results along with all available histopathologic and imaging studies. The MOC considered available treatment options and made the following recommendations/referrals:  SW, nutrition, PC  The MOC is a meeting of clinicians from various specialty areas who evaluate and discuss patients for whom a multidisciplinary approach is being considered. Final determinations in the plan of care are those of the provider(s).   Today's extended care, comprehensive team conference, Samantha Booker was not present for the discussion and was not examined.

## 2024-01-27 NOTE — Procedures (Signed)
  Procedure:  CT guided celiac plexus block and neurolysis  20ml lidocaine  1%, 30ml EtOH Preprocedure diagnosis: The primary encounter diagnosis was Pancreatic mass. A diagnosis of Epigastric pain was also pertinent to this visit. Postprocedure diagnosis: same EBL:    minimal Complications:   none immediate  See full dictation in [employer].  CHARM Toribio Faes MD Main # [phone] Pager  [phone] Mobile [phone]

## 2024-01-27 NOTE — Progress Notes (Signed)
 PROGRESS NOTE    Samantha Booker  FMW:[id number] DOB: Jan 13, 1951 DOA: 01/25/2024 PCP: Auston Reyes BIRCH, MD   Brief Narrative:   73 y.o. 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, sent from the cancer center for management of intractable abdominal pain as well as IR consult for consideration of celiac plexus block   Assessment & Plan: Intractable cancer related abdominal pain Intolerance/intractable vomiting with oral opioids and therapeutic use, initial encounter CT diagnosis (01/21/2024) of metastatic pancreatic cancer-omental and retroperitoneal Continue morphine for pain with prior antiemetics, Zofran  with Phenergan  for breakthrough IR consult - s/p Fluoro Guided PCN placement on right & CT Guided Biopsy of omental mass 09/30 Underwent Celiac Plexus Block by IR - 10/01   Hydronephrosis of right kidney Hydronephrosis and hydroureter presumably obstructed by metastatic disease Urology consult appreciated. S/p Fluoro Guided PCN placement on right by IR Continue nephrostomy tube for drainage Anticipate hematuria will resolve on its own Will need to follow-up with IR for routine nephrostomy tube exchanges   Hypothyroidism Continue home levothyroxine  as tolerated  Hypokalemia Replete and recheck Pharmacy c/s for electrolytes     DVT prophylaxis: SCD's Place and maintain sequential compression device Start: 01/26/24 2112     Code Status: Full code Family Communication: husband updated at bedside  Disposition Plan: possible DC in 1-2 days depending on clinical condition and IR procedure tomorrow   Consultants:  Urology IR  Procedures:  Fluoro Guided PCN placement on right & CT Guided Biopsy of omental mass 09/30, Celiac Plexus Block 10/01  Antimicrobials:  Levaquin    Subjective:  Patient was awaiting celiac plexus block, was n.p.o. Husband at the bedside, states pain  somewhat better than yesterday  Objective: Vitals:   01/26/24 1315 01/26/24 1455 01/26/24 2012 01/27/24 0429  BP: (!) 147/88 (!) 168/83 (!) 163/78 (!) 142/77  Pulse: 68 71 70 78  Resp: 18 19 16 16   Temp: 97.8 F (36.6 C) 97.9 F (36.6 C) 97.6 F (36.4 C) 98.5 F (36.9 C)  TempSrc:  Oral    SpO2: 100% 99% 98% 97%  Weight:      Height:        Intake/Output Summary (Last 24 hours) at 01/27/2024 0814 Last data filed at 01/27/2024 0600 Gross per 24 hour  Intake 444.97 ml  Output 700 ml  Net -255.03 ml   Filed Weights   01/25/24 1534  Weight: 53.5 kg    Examination:  General exam: Appears calm and comfortable, frail and cachectic looking Respiratory system: Clear to auscultation. Respiratory effort normal. Cardiovascular system: S1 & S2 heard, RRR. No JVD, murmurs, rubs, gallops or clicks. No pedal edema. Gastrointestinal system: Abdomen is soft, benign Central nervous system: Alert and oriented. No focal neurological deficits. Extremities: Symmetric 5 x 5 power. Skin: No rashes, lesions or ulcers Psychiatry: Judgement and insight appear normal. Mood & affect appropriate.   Data Reviewed: I have personally reviewed following labs and imaging studies  CBC: Recent Labs  Lab 01/25/24 1718 01/27/24 0341  WBC 10.0 10.1  NEUTROABS 7.2  --   HGB 12.5 12.2  HCT 37.7 36.1  MCV 90.8 90.5  PLT 242 221   Basic Metabolic Panel: Recent Labs  Lab 01/25/24 1718 01/27/24 0341  NA 139 138  K 3.3* 3.2*  CL 103 105  CO2 23 23  GLUCOSE 127* 105*  BUN 11 8  CREATININE 0.54 0.44  CALCIUM  9.1 8.3*  MG  --  1.7  PHOS  --  3.0   GFR: Estimated Creatinine Clearance: 48.8 mL/min (by C-G formula based on SCr of 0.44 mg/dL). Liver Function Tests: Recent Labs  Lab 01/25/24 1718  AST 24  ALT 24  ALKPHOS 77  BILITOT 0.4  PROT 6.8  ALBUMIN 3.5   Recent Labs  Lab 01/25/24 1718  LIPASE 52*   No results for input(s): AMMONIA in the last 168 hours. Coagulation  Profile: Recent Labs  Lab 01/25/24 1718  INR 1.0       Radiology Studies: IR NEPHROSTOMY PLACEMENT RIGHT Result Date: 01/26/2024 INDICATION: Right-sided hydronephrosis in a patient with metastatic pancreatic cancer. Increasing abdominal pain which may be related to urinary obstruction. EXAM: Nephrostomy tube placement COMPARISON:  None Available. ANESTHESIA/SEDATION: Moderate (conscious) sedation was employed during this procedure. A total of Versed  0.5 mg and Fentanyl  25 mcg was administered intravenously by the radiology nurse. Total intra-service moderate Sedation Time: 10 minutes. The patient's level of consciousness and vital signs were monitored continuously by radiology nursing throughout the procedure under my direct supervision. CONTRAST:  10 mL Omnipaque  300-administered into the collecting system(s) FLUOROSCOPY: Radiation Exposure Index (as provided by the fluoroscopic device): 5 mGy Kerma COMPLICATIONS: None immediate. PROCEDURE: Informed written consent was obtained from the patient after a thorough discussion of the procedural risks, benefits and alternatives. All questions were addressed. Maximal Sterile Barrier Technique was utilized including caps, mask, sterile gowns, sterile gloves, sterile drape, hand hygiene and skin antiseptic. A timeout was performed prior to the initiation of the procedure. In a prone position on the IR table, the right flank region was prepped and draped in usual sterile fashion. Local anesthesia was achieved by infiltrating subcutaneous tissue to the level of the renal capsule with 1 percent lidocaine  under ultrasound guidance. A small incision was then made in the right flank region. Through the incision an Accustick needle was advanced with ultrasound guidance from the skin to the renal capsule and into a lower pole calyx. Calyx and renal pelvis are moderately dilated on ultrasound corresponding with findings from the recent CT of the abdomen and pelvis. The  stylet was removed and clear urine was obtained at the hub of the needle. Small volume of contrast was injected demonstrating needle position within the renal collecting system. An 018 guidewire was then advanced under fluoroscopy and coiled in the renal pelvis. Access was then exchanged over the guidewire for an Accustick introducer set. The stiffener and introducer were then removed as well as the guidewire and a short Amplatz wire was advanced under fluoroscopic guidance. The sheath was then removed and the access site was dilated using a 10 [place] dilator. A 10 French percutaneous nephrostomy tube was then advanced over the guidewire and into position within the renal pelvis as the stiffener and guidewire were retrieved. Retention suture and sterile dressing applied. Catheter connected to gravity drainage. IMPRESSION: Satisfactory placement of a right-sided percutaneous nephrostomy tube (10 [place]). The patient will return in approximately 8 weeks for routine exchange. Electronically Signed   By: Cordella Banner   On: 01/26/2024 14:31   CT ABDOMINAL MASS BIOPSY Result Date: 01/26/2024 INDICATION: The patient has multiple mesenteric lymph nodes, pancreatic tail mass, and significant bilateral omental thickening. EXAM: CT-guided biopsy TECHNIQUE: Multidetector CT imaging of the abdomen was performed following the standard protocol without IV contrast. RADIATION DOSE REDUCTION: This exam was performed according to the departmental dose-optimization program which includes automated exposure control, adjustment of the mA and/or kV according to patient size and/or use  of iterative reconstruction technique. MEDICATIONS: None. ANESTHESIA/SEDATION: Moderate (conscious) sedation was employed during this procedure. A total of Versed  1 mg and Fentanyl  50 mcg was administered intravenously by the radiology nurse. Total intra-service moderate Sedation Time: 15 minutes. The patient's level of consciousness and vital signs  were monitored continuously by radiology nursing throughout the procedure under my direct supervision. COMPLICATIONS: None immediate. PROCEDURE: Informed written consent was obtained from the patient after a thorough discussion of the procedural risks, benefits and alternatives. All questions were addressed. Maximal Sterile Barrier Technique was utilized including caps, mask, sterile gowns, sterile gloves, sterile drape, hand hygiene and skin antiseptic. A timeout was performed prior to the initiation of the procedure. In a supine position with radiopaque markers on the abdomen, initial axial images of the abdomen were obtained. Images were compared to the previous study of the abdomen and measurements were then obtained using the expected target region. The patient's skin was then prepped and draped in usual sterile fashion. Local anesthesia was achieved by infiltrating subcutaneous tissue to the rectus muscle with 1% lidocaine . A 17 gauge trocar was then advanced from the skin incision through the abdomen rectus oriented towards the omental thickening on the left side. Two 18 gauge samples were then obtained using the BioPince needle. Further imaging was obtained and the trocar was repositioned and a third and final 18 gauge sample was obtained. Samples placed in formalin and sent to pathology. All needles withdrawn from the patient and sterile dressing applied. IMPRESSION: Satisfactory CT-guided omental mass biopsy as described above. Electronically Signed   By: Cordella Banner   On: 01/26/2024 13:56        Scheduled Meds:  bupivacaine (PF)  5 mL Infiltration to XRAY   feeding supplement  237 mL Oral BID BM   levothyroxine   112 mcg Oral QAC breakfast   potassium chloride  40 mEq Oral Once   sodium chloride  flush  5 mL Intracatheter Q8H   venlafaxine XR  75 mg Oral Q breakfast   Continuous Infusions:  sodium chloride  40 mL/hr at 01/26/24 1549   levofloxacin (LEVAQUIN) IV     magnesium sulfate  bolus IVPB     promethazine  (PHENERGAN ) injection (IM or IVPB) 12.5 mg (01/27/24 0236)     LOS: 1 day    Time spent: 35 mins    Laree Lock, MD [facility] Pager [phone]  If 7PM-7AM, please contact night-coverage [email or web address]  01/27/2024, 8:14 AM

## 2024-01-27 NOTE — Plan of Care (Signed)

## 2024-01-27 NOTE — Progress Notes (Signed)
 PT Cancellation Note  Patient Details Name: NOHEMY KOOP MRN: [id number] DOB: 21-Mar-1951   Cancelled Treatment:    Reason Eval/Treat Not Completed: Other (comment);Patient at procedure or test/unavailable  Randine Essex, PT, MPT  Randine LULLA Essex 01/27/2024, 2:31 PM

## 2024-01-27 NOTE — TOC Initial Note (Signed)
 Transition of Care [facility]) - Initial/Assessment Note    Patient Details  Name: Samantha Booker MRN: [id number] Date of Birth: 11/06/1950  [facility]) CM/SW Contact:    Dalia GORMAN Fuse, RN Phone Number: 01/27/2024, 10:31 AM  Clinical Narrative:                 Patient is from home and admitted with cancer related abdominal. Medical record reviewed and patient has no TOC needs at this time. Please outreach to [facility] if needs are identified.          Patient Goals and CMS Choice            Expected Discharge Plan and Services                                              Prior Living Arrangements/Services                       Activities of Daily Living   ADL Screening (condition at time of admission) Independently performs ADLs?: No Does the patient have a NEW difficulty with bathing/dressing/toileting/self-feeding that is expected to last >3 days?: No Does the patient have a NEW difficulty with getting in/out of bed, walking, or climbing stairs that is expected to last >3 days?: Yes (Initiates electronic notice to provider for possible PT consult) Does the patient have a NEW difficulty with communication that is expected to last >3 days?: No Is the patient deaf or have difficulty hearing?: No Does the patient have difficulty seeing, even when wearing glasses/contacts?: No Does the patient have difficulty concentrating, remembering, or making decisions?: No  Permission Sought/Granted                  Emotional Assessment              Admission diagnosis:  Epigastric pain [R10.13] Pancreatic mass [K86.89] Intractable pain [R52] Patient Active Problem List   Diagnosis Date Noted   Intractable cancer related abdominal pain 01/25/2024   Hydronephrosis of right kidney 01/25/2024   CT diagnosis (01/21/2024) of primary pancreatic cancer with omental and retroperitoneal metastases (HCC) 01/25/2024   Intolerance/Intractable  vomiting with oral opioids in therapeutic use, initial encounter 01/25/2024   Pancreatic mass 01/18/2024   Abdominal discomfort 01/18/2024   Fatigue 01/18/2024   Dyspepsia    Ovarian cyst 12/15/2018   Ventral hernia 12/15/2018   DDD (degenerative disc disease), cervical 10/11/2018   Need for 23-polyvalent pneumococcal polysaccharide vaccine 07/10/2017   Encounter for Medicare annual wellness exam 07/10/2017   Full code status 07/10/2017   Hormone replacement therapy (HRT) 07/10/2017   Atherosclerosis of aorta 06/25/2016   Family history of abdominal aortic aneurysm (AAA) 06/09/2016   Preventative health care 06/09/2016   Hyperlipidemia LDL goal <100 10/19/2014   Low serum vitamin D  10/19/2014   Osteopenia 10/19/2014   History of bladder surgery 10/19/2014   Right shoulder pain 10/08/2012   Rosacea 10/08/2012   Menopausal symptoms 05/03/2012   Hypothyroidism 12/31/2011   Low back pain 12/31/2011   Hot flashes 12/31/2011   PCP:  Auston Reyes BIRCH, MD Pharmacy:   [id number] [place], [place] - [address] AT [facility] [address] [facility] [address] [place] [address] Phone: [phone] Fax: [phone]  [id number] [place], [place] -  [address] [place] [address] Phone: [phone] Fax: [phone]  [facility] [place], [place] - [id number] [place] [address] Phone: [phone] Fax: [phone]  [facility] - [place], [place] - [address] [place] [address] Phone: [phone] Fax: [phone]     Social Drivers of Health (SDOH) Social History: SDOH Screenings   Food Insecurity: No Food Insecurity (01/26/2024)  Housing: Low Risk  (01/26/2024)  Transportation Needs: No Transportation Needs (01/26/2024)  Utilities: Not At Risk (01/26/2024)  Alcohol Screen: Low Risk  (04/29/2018)  Depression (PHQ2-9): Medium Risk (01/25/2024)  Financial Resource  Strain: Low Risk  (01/18/2024)  Social Connections: Unknown (01/26/2024)  Stress: No Stress Concern Present (01/18/2024)  Tobacco Use: Medium Risk (01/26/2024)  Health Literacy: Adequate Health Literacy (01/18/2024)   SDOH Interventions:     Readmission Risk Interventions     No data to display

## 2024-01-27 NOTE — Progress Notes (Signed)
 Urology Inpatient Progress Note  Subjective: No acute events overnight. She is afebrile, VSS. Creatinine down, 0.44.  Hemoglobin stable, 12.2. Right PCN in place draining clear, amber urine. Today she reports she has felt worse.  Anti-infectives: Anti-infectives (From admission, onward)    Start     Dose/Rate Route Frequency Ordered Stop   01/26/24 1115  levofloxacin (LEVAQUIN) IVPB 500 mg        500 mg 100 mL/hr over 60 Minutes Intravenous To Radiology 01/26/24 1100 01/27/24 1115   01/26/24 1113  levofloxacin (LEVAQUIN) IVPB        over 60 Minutes  Continuous PRN 01/26/24 1115 01/26/24 1113       Current Facility-Administered Medications  Medication Dose Route Frequency Provider Last Rate Last Admin   acetaminophen  (TYLENOL ) tablet 650 mg  650 mg Oral Q6H PRN Duncan, Hazel V, MD       Or   acetaminophen  (TYLENOL ) suppository 650 mg  650 mg Rectal Q6H PRN Cleatus Delayne GAILS, MD       bupivacaine (PF) (MARCAINE ) 0.5 % injection 5 mL  5 mL Infiltration to XRAY McInnis, Caitlyn M, PA-C       feeding supplement (ENSURE PLUS HIGH PROTEIN) liquid 237 mL  237 mL Oral BID BM Maree, Vipul, MD       levofloxacin (LEVAQUIN) IVPB 500 mg  500 mg Intravenous to XRAY McInnis, Caitlyn M, PA-C       levothyroxine  (SYNTHROID ) tablet 112 mcg  112 mcg Oral QAC breakfast Duncan, Hazel V, MD   112 mcg at 01/27/24 0540   morphine (PF) 2 MG/ML injection 2 mg  2 mg Intravenous Q2H PRN Duncan, Hazel V, MD   2 mg at 01/27/24 0935   ondansetron  (ZOFRAN ) tablet 4 mg  4 mg Oral Q6H PRN Duncan, Hazel V, MD       Or   ondansetron  (ZOFRAN ) injection 4 mg  4 mg Intravenous Q6H PRN Duncan, Hazel V, MD   4 mg at 01/26/24 1504   promethazine  (PHENERGAN ) 12.5 mg in sodium chloride  0.9 % 50 mL IVPB  12.5 mg Intravenous Q6H PRN Cleatus Delayne GAILS, MD 150 mL/hr at 01/27/24 0236 12.5 mg at 01/27/24 0236   sodium chloride  flush (NS) 0.9 % injection 5 mL  5 mL Intracatheter Q8H Jenna Cordella LABOR, MD   5 mL at 01/27/24 0540    venlafaxine XR (EFFEXOR-XR) 24 hr capsule 75 mg  75 mg Oral Q breakfast Cleatus Delayne GAILS, MD   75 mg at 01/27/24 0859     Objective: Vital signs in last 24 hours: Temp:  [97.5 F (36.4 C)-98.5 F (36.9 C)] 98.5 F (36.9 C) (10/01 0429) Pulse Rate:  [66-78] 78 (10/01 0429) Resp:  [11-21] 16 (10/01 0429) BP: (142-169)/(72-118) 142/77 (10/01 0429) SpO2:  [97 %-100 %] 97 % (10/01 0429)  Intake/Output from previous day: 09/30 0701 - 10/01 0700 In: 445 [P.O.:180; I.V.:205; IV Piggyback:50] Out: 700 [Urine:550; Emesis/NG output:150] Intake/Output this shift: No intake/output data recorded.  Physical Exam Vitals and nursing note reviewed.  Constitutional:      General: She is not in acute distress.    Appearance: She is not ill-appearing, toxic-appearing or diaphoretic.  HENT:     Head: Normocephalic and atraumatic.  Pulmonary:     Effort: Pulmonary effort is normal. No respiratory distress.  Skin:    General: Skin is warm and dry.  Neurological:     Mental Status: She is alert and oriented to person, place, and time.  Psychiatric:  Mood and Affect: Mood normal.        Behavior: Behavior normal.    Lab Results:  Recent Labs    01/25/24 1718 01/27/24 0341  WBC 10.0 10.1  HGB 12.5 12.2  HCT 37.7 36.1  PLT 242 221   BMET Recent Labs    01/25/24 1718 01/27/24 0341  NA 139 138  K 3.3* 3.2*  CL 103 105  CO2 23 23  GLUCOSE 127* 105*  BUN 11 8  CREATININE 0.54 0.44  CALCIUM  9.1 8.3*   PT/INR Recent Labs    01/25/24 1718  LABPROT 14.2  INR 1.0   Studies/Results: IR NEPHROSTOMY PLACEMENT RIGHT Result Date: 01/26/2024 INDICATION: Right-sided hydronephrosis in a patient with metastatic pancreatic cancer. Increasing abdominal pain which may be related to urinary obstruction. EXAM: Nephrostomy tube placement COMPARISON:  None Available. ANESTHESIA/SEDATION: Moderate (conscious) sedation was employed during this procedure. A total of Versed  0.5 mg and Fentanyl   25 mcg was administered intravenously by the radiology nurse. Total intra-service moderate Sedation Time: 10 minutes. The patient's level of consciousness and vital signs were monitored continuously by radiology nursing throughout the procedure under my direct supervision. CONTRAST:  10 mL Omnipaque  300-administered into the collecting system(s) FLUOROSCOPY: Radiation Exposure Index (as provided by the fluoroscopic device): 5 mGy Kerma COMPLICATIONS: None immediate. PROCEDURE: Informed written consent was obtained from the patient after a thorough discussion of the procedural risks, benefits and alternatives. All questions were addressed. Maximal Sterile Barrier Technique was utilized including caps, mask, sterile gowns, sterile gloves, sterile drape, hand hygiene and skin antiseptic. A timeout was performed prior to the initiation of the procedure. In a prone position on the IR table, the right flank region was prepped and draped in usual sterile fashion. Local anesthesia was achieved by infiltrating subcutaneous tissue to the level of the renal capsule with 1 percent lidocaine  under ultrasound guidance. A small incision was then made in the right flank region. Through the incision an Accustick needle was advanced with ultrasound guidance from the skin to the renal capsule and into a lower pole calyx. Calyx and renal pelvis are moderately dilated on ultrasound corresponding with findings from the recent CT of the abdomen and pelvis. The stylet was removed and clear urine was obtained at the hub of the needle. Small volume of contrast was injected demonstrating needle position within the renal collecting system. An 018 guidewire was then advanced under fluoroscopy and coiled in the renal pelvis. Access was then exchanged over the guidewire for an Accustick introducer set. The stiffener and introducer were then removed as well as the guidewire and a short Amplatz wire was advanced under fluoroscopic guidance. The  sheath was then removed and the access site was dilated using a 10 [place] dilator. A 10 French percutaneous nephrostomy tube was then advanced over the guidewire and into position within the renal pelvis as the stiffener and guidewire were retrieved. Retention suture and sterile dressing applied. Catheter connected to gravity drainage. IMPRESSION: Satisfactory placement of a right-sided percutaneous nephrostomy tube (10 [place]). The patient will return in approximately 8 weeks for routine exchange. Electronically Signed   By: Cordella Banner   On: 01/26/2024 14:31   CT ABDOMINAL MASS BIOPSY Result Date: 01/26/2024 INDICATION: The patient has multiple mesenteric lymph nodes, pancreatic tail mass, and significant bilateral omental thickening. EXAM: CT-guided biopsy TECHNIQUE: Multidetector CT imaging of the abdomen was performed following the standard protocol without IV contrast. RADIATION DOSE REDUCTION: This exam was performed according to the departmental dose-optimization  program which includes automated exposure control, adjustment of the mA and/or kV according to patient size and/or use of iterative reconstruction technique. MEDICATIONS: None. ANESTHESIA/SEDATION: Moderate (conscious) sedation was employed during this procedure. A total of Versed  1 mg and Fentanyl  50 mcg was administered intravenously by the radiology nurse. Total intra-service moderate Sedation Time: 15 minutes. The patient's level of consciousness and vital signs were monitored continuously by radiology nursing throughout the procedure under my direct supervision. COMPLICATIONS: None immediate. PROCEDURE: Informed written consent was obtained from the patient after a thorough discussion of the procedural risks, benefits and alternatives. All questions were addressed. Maximal Sterile Barrier Technique was utilized including caps, mask, sterile gowns, sterile gloves, sterile drape, hand hygiene and skin antiseptic. A timeout was performed  prior to the initiation of the procedure. In a supine position with radiopaque markers on the abdomen, initial axial images of the abdomen were obtained. Images were compared to the previous study of the abdomen and measurements were then obtained using the expected target region. The patient's skin was then prepped and draped in usual sterile fashion. Local anesthesia was achieved by infiltrating subcutaneous tissue to the rectus muscle with 1% lidocaine . A 17 gauge trocar was then advanced from the skin incision through the abdomen rectus oriented towards the omental thickening on the left side. Two 18 gauge samples were then obtained using the BioPince needle. Further imaging was obtained and the trocar was repositioned and a third and final 18 gauge sample was obtained. Samples placed in formalin and sent to pathology. All needles withdrawn from the patient and sterile dressing applied. IMPRESSION: Satisfactory CT-guided omental mass biopsy as described above. Electronically Signed   By: Cordella Banner   On: 01/26/2024 13:56   Assessment & Plan: 73 y.o. female with newly diagnosed metastatic pancreatic cancer with right hydroureteronephrosis due to presumed malignant obstruction versus compression of the distal ureter admitted with intractable abdominal pain, now s/p right nephrostomy tube placement with IR.  She is tolerating her nephrostomy tube well.  Urine is appropriate for 1 day postplacement.  Anticipate hematuria will resolve on its own.  I encouraged her and her husband to look up nephrostomy tube supplies and accessories online.  We discussed that there are a wide range of these that can help managing the tube on a day-to-day basis.  We discussed that she can shower with the nephrostomy tube in place, though I would recommend using foam tape to keep the insertion site dry.  I will schedule her for outpatient follow-up with us  in about 3 months.  I encouraged them to keep close follow-up with  IR for routine nephrostomy tube exchanges.  They can contact us  in the meantime with any concerns or if she develops left flank pain.  Samantha Gaeta, PA-C 01/27/2024

## 2024-01-28 ENCOUNTER — Inpatient Hospital Stay

## 2024-01-28 LAB — MAGNESIUM: Magnesium: 1.9 mg/dL (ref 1.7–2.4)

## 2024-01-28 LAB — HEMOGLOBIN: Hemoglobin: 13.8 g/dL (ref 12.0–15.0)

## 2024-01-28 LAB — BASIC METABOLIC PANEL WITH GFR
Anion gap: 14 (ref 5–15)
BUN: 13 mg/dL (ref 8–23)
CO2: 18 mmol/L — ABNORMAL LOW (ref 22–32)
Calcium: 8.5 mg/dL — ABNORMAL LOW (ref 8.9–10.3)
Chloride: 105 mmol/L (ref 98–111)
Creatinine, Ser: 0.53 mg/dL (ref 0.44–1.00)
GFR, Estimated: >60 mL/min (ref >60)
Glucose, Bld: 109 mg/dL — ABNORMAL HIGH (ref 70–99)
Potassium: 3.9 mmol/L (ref 3.5–5.1)
Sodium: 137 mmol/L (ref 135–145)

## 2024-01-28 MED ORDER — OXYCODONE HCL 5 MG PO TABS: 2.5000 mg | ORAL_TABLET | ORAL | Status: DC | PRN

## 2024-01-28 MED ORDER — MORPHINE SULFATE 15 MG PO TABS
15.0000 mg | ORAL_TABLET | ORAL | Status: DC | PRN
Start: 2024-01-28 — End: 2024-01-29

## 2024-01-28 MED ORDER — MIRTAZAPINE 15 MG PO TABS
15.0000 mg | ORAL_TABLET | Freq: Every day | ORAL | Status: DC
  Administered 2024-01-28: 15 mg via ORAL
  Filled 2024-01-28: qty 1

## 2024-01-28 MED ORDER — PROCHLORPERAZINE EDISYLATE 10 MG/2ML IJ SOLN: 10.0000 mg | Freq: Four times a day (QID) | INTRAMUSCULAR | Status: DC | PRN

## 2024-01-28 MED ORDER — BOOST / RESOURCE BREEZE PO LIQD CUSTOM
1.0000 | Freq: Three times a day (TID) | ORAL | Status: DC
  Administered 2024-01-28 (×2): 1 via ORAL

## 2024-01-28 MED ORDER — PREGABALIN 75 MG PO CAPS
75.0000 mg | ORAL_CAPSULE | Freq: Two times a day (BID) | ORAL | Status: DC
  Administered 2024-01-28 – 2024-01-29 (×3): 75 mg via ORAL
  Filled 2024-01-28 (×3): qty 1

## 2024-01-28 MED ORDER — MORPHINE SULFATE (PF) 2 MG/ML IV SOLN
2.0000 mg | INTRAVENOUS | Status: DC | PRN
  Administered 2024-01-28: 2 mg via INTRAVENOUS
  Filled 2024-01-28: qty 1

## 2024-01-28 MED ORDER — ATORVASTATIN CALCIUM 20 MG PO TABS
40.0000 mg | ORAL_TABLET | Freq: Every day | ORAL | Status: DC
  Administered 2024-01-28: 40 mg via ORAL
  Filled 2024-01-28: qty 2

## 2024-01-28 MED ORDER — OXYCODONE HCL 5 MG PO TABS
5.0000 mg | ORAL_TABLET | ORAL | Status: DC | PRN
  Filled 2024-01-28: qty 1

## 2024-01-28 MED ORDER — PROCHLORPERAZINE MALEATE 5 MG PO TABS
5.0000 mg | ORAL_TABLET | Freq: Four times a day (QID) | ORAL | Status: DC | PRN
  Administered 2024-01-28: 5 mg via ORAL
  Filled 2024-01-28 (×2): qty 1

## 2024-01-28 NOTE — Evaluation (Signed)
 Occupational Therapy Evaluation Patient Details Name: Samantha Booker MRN: [id number] DOB: Oct 16, 1950 Today's Date: 01/28/2024   History of Present Illness   Pt is a 73 y/o F admitted on 01/25/24 after presenting from the cancer center with c/o intractable abdominal pain. Pt is s/p Fluoro guided PCN placement on R & CT guided biopsy of omental mass on 01/26/24, celiac plexus block on 01/27/24. PMH: pancreatic CA with retroperitoneal & omental metastasis,  cervical DDD, Hep B, HLD, hypothyroidism     Clinical Impressions Pt was seen for OT evaluation this date. Prior to hospital admission, pt endorsed exertional ADL/IADL becoming more challenging, particularly yard work and meal prep. Pt lives with her spouse and has been assisting more with meal prep and housekeeping tasks lately. Pt presents with deficits in activity tolerance, endorsed SOB with nursing prior to evaluation, and decreased standing tolerance, affecting safe and optimal ADL completion. Pt currently requires PRN MIN A for LB ADL tasks. Pt educated in ECS including activity pacing, work simplifications, modifications to home/routines, and use of PRE scale to support exertion. Pt verbalized understanding, expressed appreciation. Pt would benefit from skilled OT services to address noted impairments and functional limitations (see below for any additional details) in order to maximize safety and independence while minimizing future risk of falls, injury, and readmission. Anticipate the need for follow up OT services upon acute hospital DC.    If plan is discharge home, recommend the following:   Assistance with cooking/housework;Assist for transportation     Functional Status Assessment   Patient has had a recent decline in their functional status and demonstrates the ability to make significant improvements in function in a reasonable and predictable amount of time.     Equipment Recommendations   None recommended by OT      Recommendations for Other Services         Precautions/Restrictions   Precautions Precautions: Fall Recall of Precautions/Restrictions: Intact Precaution/Restrictions Comments: R nephrostomy tube Restrictions Weight Bearing Restrictions Per Provider Order: No     Mobility Bed Mobility               General bed mobility comments: NT, in recliner pre and post session    Transfers                   General transfer comment: pt declined 2/2 medication kicking in and feeling a bit out of it      Balance Overall balance assessment: Needs assistance Sitting-balance support: Feet supported Sitting balance-Leahy Scale: Good                                     ADL either performed or assessed with clinical judgement   ADL Overall ADL's : Needs assistance/impaired                                       General ADL Comments: Pt requires PRN MIN A for LB ADL tasks, increased time/effort to complete ADL, and prolonged standing for ADL/IADL impaired.     Vision         Perception         Praxis         Pertinent Vitals/Pain Pain Assessment Pain Assessment: 0-10 Pain Score: 4  Pain Location: lower abdomen Pain Descriptors / Indicators: Discomfort, Guarding Pain Intervention(s):  Monitored during session, Patient requesting pain meds-RN notified     Extremity/Trunk Assessment Upper Extremity Assessment Upper Extremity Assessment: Overall WFL for tasks assessed   Lower Extremity Assessment Lower Extremity Assessment: Generalized weakness;Overall [facility] for tasks assessed   Cervical / Trunk Assessment Cervical / Trunk Assessment: Normal   Communication Communication Communication: No apparent difficulties   Cognition Arousal: Alert Behavior During Therapy: WFL for tasks assessed/performed Cognition: No apparent impairments                               Following commands: Intact        Cueing  General Comments   Cueing Techniques: Verbal cues      Exercises Other Exercises Other Exercises: Pt educated in ECS including activity pacing, work simplifications, modifications to home/routines, and use of PRE scale to support exertion   Shoulder Instructions      Home Living Family/patient expects to be discharged to:: Private residence Living Arrangements: Spouse/significant other Available Help at Discharge: Family Type of Home: House Home Access: Stairs to enter Secretary/administrator of Steps: 1 Entrance Stairs-Rails:  (can hold to door) Home Layout: One level     Bathroom Shower/Tub: Producer, television/film/video:  (comfort height)     Home Equipment: Cane - single point;Shower seat - built in   Additional Comments: house cleaner 1x/month      Prior Functioning/Environment Prior Level of Function : Independent/Modified Independent             Mobility Comments: independent without AD, driving, denies falls ADLs Comments: independent with bathing, dressing, reports sitting for majority of the shower, difficulty doing yardwork and cooking recently    OT Problem List: Decreased activity tolerance;Decreased knowledge of use of DME or AE   OT Treatment/Interventions: Self-care/ADL training;Therapeutic exercise;Therapeutic activities;Energy conservation;DME and/or AE instruction;Patient/family education      OT Goals(Current goals can be found in the care plan section)   Acute Rehab OT Goals Patient Stated Goal: go home OT Goal Formulation: With patient Time For Goal Achievement: 02/11/24 Potential to Achieve Goals: Good ADL Goals Additional ADL Goal #1: Pt will verbalize plan to implement at least 2 learned ECS to support safety with ADL/IADL tasks. Additional ADL Goal #2: Pt will verbalize plan to implement at least 2 learned falls prevention strategies to improve safety wiht ADL/IADL.   OT Frequency:  Min 2X/week    Co-evaluation               AM-PAC OT 6 Clicks Daily Activity     Outcome Measure Help from another person eating meals?: None Help from another person taking care of personal grooming?: None Help from another person toileting, which includes using toliet, bedpan, or urinal?: None Help from another person bathing (including washing, rinsing, drying)?: A Little Help from another person to put on and taking off regular upper body clothing?: None Help from another person to put on and taking off regular lower body clothing?: A Little 6 Click Score: 22   End of Session Nurse Communication: Patient requests pain meds  Activity Tolerance: Patient tolerated treatment well Patient left: in chair;with call bell/phone within reach;with chair alarm set;with nursing/sitter in room  OT Visit Diagnosis: Other abnormalities of gait and mobility (R26.89)                Time: 8883-8870 OT Time Calculation (min): 13 min Charges:  OT General Charges $OT Visit: 1 Visit  OT Evaluation $OT Eval Low Complexity: 1 Low OT Treatments $Self Care/Home Management : 8-22 mins  Warren SAUNDERS., MPH, MS, OTR/L ascom [phone] 01/28/24, 2:11 PM

## 2024-01-28 NOTE — Progress Notes (Signed)
 PROGRESS NOTE    Samantha Booker  FMW:[id number] DOB: 1950/11/09 DOA: 01/25/2024 PCP: Auston Reyes BIRCH, MD   Brief Narrative:   73 y.o. 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 Babara outpatient on 10/07   Hydronephrosis of right kidney Hydronephrosis and hydroureter presumably obstructed by metastatic disease Urology consult appreciated. S/p Fluoro Guided PCN placement on right by IR Continue nephrostomy tube for drainage Anticipate hematuria will resolve on its own Will need to follow-up with IR for routine nephrostomy tube exchanges   Hypothyroidism Continue home levothyroxine  as tolerated  Hypokalemia Replete and recheck Pharmacy c/s for electrolytes  -- PT/OT eval - Home PT/OT     DVT prophylaxis: SCD's Place and maintain sequential compression device Start: 01/26/24 2112     Code Status: Full code Family Communication: None Disposition Plan: Discharge tomorrow once pain controlled, nausea and p.o. intake improved   Consultants:  Urology IR  Procedures:  Fluoro Guided PCN placement on right & CT Guided Biopsy of omental mass 09/30, Celiac Plexus Block 10/01  Antimicrobials:  Levaquin    Subjective:  Patient states she feels difficulty  breathing,.  Clear on auscultation.  Pending CXR With poor p.o. intake, due to nausea -getting antiemetics and also added Remeron for appetite  Objective: Vitals:   01/27/24 1545 01/27/24 1926 01/28/24 0420 01/28/24 0759  BP: (!) 162/94 (!) 157/92 (!) 172/90 (!) 139/91  Pulse: 74 85 94 95  Resp: 13 19 19 17   Temp: 98 F (36.7 C) 98 F (36.7 C) 98.7 F (37.1 C) 97.9 F (36.6 C)  TempSrc:  Oral Oral Oral  SpO2: 98% 98% 100% 97%  Weight:      Height:        Intake/Output Summary (Last 24 hours) at 01/28/2024 1439 Last data filed at 01/28/2024 0900 Gross per 24 hour  Intake 534.62 ml  Output 150 ml  Net 384.62 ml   Filed Weights   01/25/24 1534  Weight: 53.5 kg    Examination:  General exam: Appears calm and comfortable, frail and cachectic looking Respiratory system: Clear to auscultation. Respiratory effort normal. Cardiovascular system: S1 & S2 heard, RRR. No JVD, murmurs, rubs, gallops or clicks. No pedal edema. Gastrointestinal system: Abdomen is soft, benign Central nervous system: Alert and oriented. No focal neurological deficits. Extremities: Symmetric 5 x 5 power. Skin: No rashes, lesions or ulcers Psychiatry: Judgement and insight appear normal. Mood & affect appropriate.   Data Reviewed: I have personally reviewed following labs and imaging studies  CBC: Recent Labs  Lab 01/25/24 1718 01/27/24 0341 01/28/24 0611  WBC 10.0 10.1  --   NEUTROABS 7.2  --   --   HGB 12.5 12.2 13.8  HCT 37.7 36.1  --   MCV 90.8 90.5  --   PLT 242 221  --    Basic Metabolic Panel: Recent Labs  Lab 01/25/24 1718 01/27/24 0341  01/28/24 0611  NA 139 138 137  K 3.3* 3.2* 3.9  CL 103 105 105  CO2 23 23 18*  GLUCOSE 127* 105* 109*  BUN 11 8 13   CREATININE 0.54 0.44 0.53  CALCIUM  9.1 8.3* 8.5*  MG  --  1.7 1.9  PHOS  --  3.0  --    GFR: Estimated Creatinine Clearance: 48.8 mL/min (by C-G formula based on SCr of 0.53 mg/dL). Liver Function Tests: Recent Labs  Lab  01/25/24 1718  AST 24  ALT 24  ALKPHOS 77  BILITOT 0.4  PROT 6.8  ALBUMIN 3.5   Recent Labs  Lab 01/25/24 1718  LIPASE 52*   No results for input(s): AMMONIA in the last 168 hours. Coagulation Profile: Recent Labs  Lab 01/25/24 1718  INR 1.0       Radiology Studies: CT CELIAC PLEXUS BLOCK NEUROLYTIC Result Date: 01/27/2024 CLINICAL DATA:  , Severe abdominal pain metastatic pancreatic carcinoma EXAM: CT GUIDED NEUROLYTIC ABLATION OF THE CELIAC AXIS ANESTHESIA/SEDATION: Intravenous Fentanyl  100mcg and Versed  1mg  were administered by RN during a total moderate (conscious) sedation time of 43 minutes; the patient's level of consciousness and physiological / cardiorespiratory status were monitored continuously by radiology RN under my direct supervision. PROCEDURE: The procedure risks, benefits, and alternatives were explained to the patient. Questions regarding the procedure were encouraged and answered. The patient understands and consents to the procedure. The anterior abdominal wall was prepped with chlorhexidine  in a sterile fashion, and a sterile drape was applied covering the operative field. A sterile gown and sterile gloves were used for the procedure. Local anesthesia was provided with 1% Lidocaine . A 22 gauge Chiba needle was advanced under CT guidance to the level of the celiac plexus to the left of midline. After confirming needle tip position, approximately three ml of a 1:10 dilution of Omnipaque -300 contrast and saline was injected. Unilateral retroperitoneal spread of diluted contrast material was confirmed by CT. A 22 gauge Chiba needle was advanced under CT guidance to the level of the celiac plexus to the right of midline. After confirming needle tip position, approximately three ml of a 1:10 dilution of Omnipaque -300 contrast and saline was injected. Spread of diluted contrast material was confirmed by CT. 20 mL lidocaine  1% was then administered divided evenly between  the 2 sites. Patient observed for 10 minutes. Hemodynamic stability was confirmed. Subsequently, alcohol ablation of the celiac plexus was then performed with injection of 30 ml of absolute alcohol. COMPLICATIONS: None FINDINGS: Contrast injection showed spread bilaterally at the level of the celiac plexus. Celiac plexus block was successfully performed without complication. Alcohol neurolysis was performed without complication. IMPRESSION: CT guided block and neurolytic ablation of the celiac plexus as above. Electronically Signed   By: JONETTA Faes M.D.   On: 01/27/2024 16:19        Scheduled Meds:  feeding supplement  1 Container Oral TID BM   feeding supplement  237 mL Oral BID BM   levothyroxine   112 mcg Oral QAC breakfast   sodium chloride  flush  5 mL Intracatheter Q8H   venlafaxine XR  75 mg Oral Q breakfast   Continuous Infusions:  promethazine  (PHENERGAN ) injection (IM or IVPB) 12.5 mg (01/28/24 1045)     LOS: 2 days    Time spent: 50 mins    Laree Lock, MD [facility] Pager [phone]  If 7PM-7AM, please contact night-coverage [email or web address]  01/28/2024, 2:39 PM

## 2024-01-28 NOTE — Evaluation (Signed)
 Physical Therapy Evaluation Patient Details Name: Samantha Booker MRN: [id number] DOB: Apr 08, 1951 Today's Date: 01/28/2024  History of Present Illness  Pt is a 72 y/o F admitted on 01/25/24 after presenting [facility] the cancer center with c/o intractable abdominal pain. Pt is s/p Fluoro guided PCN placement on R & CT guided biopsy of omental mass on 01/26/24, celiac plexus block on 01/27/24. 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 Pt reports prior to admission she was independent without AD, driving, denies falls. On this date, pt is able to complete bed mobility with mod I with hospital bed features & ambulates around unit without AD with supervision with slow, steady gait without LOB. Will continue to follow pt acutely to progress high level balance, gait, stair negotiation & strengthening/endurance training.        If plan is discharge home, recommend the following: Assistance with cooking/housework;Assist for transportation;Help with stairs or ramp for entrance   Can travel by private vehicle        Equipment Recommendations None recommended by PT  Recommendations for Other Services       Functional Status Assessment Patient has had a recent decline in their functional status and demonstrates the ability to make significant improvements in function in a reasonable and predictable amount of time.     Precautions / Restrictions Precautions Precautions: Fall Precaution/Restrictions Comments: R nephrostomy tube Restrictions Weight Bearing Restrictions Per Provider Order: No      Mobility  Bed Mobility Overal bed mobility: Needs Assistance Bed Mobility: Supine to Sit     Supine to sit: Modified independent (Device/Increase time), HOB elevated, Used rails (exit L side of bed, uses bed rail)          Transfers Overall transfer level: Needs  assistance Equipment used: None Transfers: Sit to/from Stand Sit to Stand: Supervision                Ambulation/Gait Ambulation/Gait assistance: Supervision Gait Distance (Feet): 160 Feet Assistive device: None Gait Pattern/deviations: Decreased step length - right, Decreased step length - left, Decreased stride length Gait velocity: decreased        Stairs            Wheelchair Mobility     Tilt Bed    Modified Rankin (Stroke Patients Only)       Balance Overall balance assessment: Needs assistance Sitting-balance support: Feet supported Sitting balance-Leahy Scale: Good     Standing balance support: During functional activity, No upper extremity supported Standing balance-Leahy Scale: Good                               Pertinent Vitals/Pain Pain Assessment Pain Assessment: 0-10 Pain Score: 4  Pain Location: lower abdomen Pain Descriptors / Indicators: Discomfort, Guarding Pain Intervention(s): Monitored during session    Home Living Family/patient expects to be discharged to:: Private residence Living Arrangements: Spouse/significant other Available Help at Discharge: Family Type of Home: House Home Access: Stairs to enter Entrance Stairs-Rails:  (can hold to door) Entrance Stairs-Number of Steps: 1   Home Layout: One level Home Equipment: Cane - single point;Shower seat - built in Additional Comments: house cleaner 1x/month    Prior Function               Mobility Comments: independent without AD, driving, denies falls  ADLs Comments: independent with bathing, dressing     Extremity/Trunk Assessment   Upper Extremity Assessment Upper Extremity Assessment: Overall WFL for tasks assessed    Lower Extremity Assessment Lower Extremity Assessment: Generalized weakness;Overall WFL for tasks assessed    Cervical / Trunk Assessment Cervical / Trunk Assessment: Normal  Communication   Communication Communication: No  apparent difficulties    Cognition Arousal: Alert Behavior During Therapy: WFL for tasks assessed/performed   PT - Cognitive impairments: No apparent impairments                       PT - Cognition Comments: pleasant lady Following commands: Intact       Cueing Cueing Techniques: Verbal cues     General Comments      Exercises     Assessment/Plan    PT Assessment Patient needs continued PT services  PT Problem List Decreased strength;Decreased activity tolerance;Decreased mobility;Decreased balance       PT Treatment Interventions DME instruction;Balance training;Neuromuscular re-education;Gait training;Stair training;Functional mobility training;Therapeutic activities;Therapeutic exercise;Cognitive remediation;Patient/family education    PT Goals (Current goals can be found in the Care Plan section)  Acute Rehab PT Goals Patient Stated Goal: feel better PT Goal Formulation: With patient Time For Goal Achievement: 02/11/24 Potential to Achieve Goals: Good    Frequency Min 1X/week     Co-evaluation               AM-PAC PT 6 Clicks Mobility  Outcome Measure Help needed turning from your back to your side while in a flat bed without using bedrails?: None Help needed moving from lying on your back to sitting on the side of a flat bed without using bedrails?: A Little Help needed moving to and from a bed to a chair (including a wheelchair)?: A Little Help needed standing up from a chair using your arms (e.g., wheelchair or bedside chair)?: A Little Help needed to walk in hospital room?: A Little Help needed climbing 3-5 steps with a railing? : A Little 6 Click Score: 19    End of Session   Activity Tolerance: Patient tolerated treatment well;Patient limited by fatigue Patient left: in chair;with call bell/phone within reach   PT Visit Diagnosis: Muscle weakness (generalized) (M62.81)    Time: 8945-8887 PT Time Calculation (min) (ACUTE ONLY):  18 min   Charges:   PT Evaluation $PT Eval Low Complexity: 1 Low   PT General Charges $$ ACUTE PT VISIT: 1 Visit         Richerd Pinal, PT, DPT 01/28/24, 11:58 AM   Richerd CHRISTELLA Pinal 01/28/2024, 11:54 AM

## 2024-01-28 NOTE — TOC Initial Note (Signed)
 Transition of Care [facility]) - Initial/Assessment Note    Patient Details  Name: Samantha Booker MRN: [id number] Date of Birth: 04-01-1951  [facility] CM/SW Contact:    Dalia GORMAN Fuse, RN Phone Number: 01/28/2024, 3:07 PM  Clinical Narrative:                 TOC met with the patient and her husband in the room. She is from home and was able to function independently prior to her hospitalization. She has a PCP, Dr. Rosalba Costa and uses [facility]. She has a cane and her neighbor is lending her a walker and shower chair to use when she returns home. She can drive, but her husband does most of the driving and will transport her home at discharge.    Therapy recs are for [facility] PT/OT. Patient doesn't have a preference. Her Payor is [facility], TOC will send referral to [facility] once orders are in. Shaun with [facility] accepted the referral.         Patient Goals and CMS Choice            Expected Discharge Plan and Services                                              Prior Living Arrangements/Services                       Activities of Daily Living   ADL Screening (condition at time of admission) Independently performs ADLs?: No Does the patient have a NEW difficulty with bathing/dressing/toileting/self-feeding that is expected to last >3 days?: No Does the patient have a NEW difficulty with getting in/out of bed, walking, or climbing stairs that is expected to last >3 days?: Yes (Initiates electronic notice to provider for possible PT consult) Does the patient have a NEW difficulty with communication that is expected to last >3 days?: No Is the patient deaf or have difficulty hearing?: No Does the patient have difficulty seeing, even when wearing glasses/contacts?: No Does the patient have difficulty concentrating, remembering, or making decisions?: No  Permission Sought/Granted                  Emotional Assessment               Admission diagnosis:  Epigastric pain [R10.13] Pancreatic mass [K86.89] Intractable pain [R52] Patient Active Problem List   Diagnosis Date Noted   Intractable cancer related abdominal pain 01/25/2024   Hydronephrosis of right kidney 01/25/2024   CT diagnosis (01/21/2024) of primary pancreatic cancer with omental and retroperitoneal metastases (HCC) 01/25/2024   Intolerance/Intractable vomiting with oral opioids in therapeutic use, initial encounter 01/25/2024   Pancreatic mass 01/18/2024   Abdominal discomfort 01/18/2024   Fatigue 01/18/2024   Dyspepsia    Ovarian cyst 12/15/2018   Ventral hernia 12/15/2018   DDD (degenerative disc disease), cervical 10/11/2018   Need for 23-polyvalent pneumococcal polysaccharide vaccine 07/10/2017   Encounter for Medicare annual wellness exam 07/10/2017   Full code status 07/10/2017   Hormone replacement therapy (HRT) 07/10/2017   Atherosclerosis of aorta 06/25/2016   Family history of abdominal aortic aneurysm (AAA) 06/09/2016   Preventative health care 06/09/2016   Hyperlipidemia LDL goal <100 10/19/2014   Low serum vitamin D  10/19/2014   Osteopenia 10/19/2014   History of bladder surgery 10/19/2014  Right shoulder pain 10/08/2012   Rosacea 10/08/2012   Menopausal symptoms 05/03/2012   Hypothyroidism 12/31/2011   Low back pain 12/31/2011   Hot flashes 12/31/2011   PCP:  Auston Reyes BIRCH, MD Pharmacy:   [id number] - [place], [place] - [address] AT [facility] [address] [facility] [address] [place] [address] Phone: [phone] Fax: [phone]  [id number] - [place], [place] - [address] [place] [address] Phone: [phone] Fax: [phone]  [facility] [place], [place] - [phone] [place] [address] Phone: [phone] Fax: [phone]  [facility] - [place], [place] - [address] [place] [address] Phone:  [phone] Fax: [phone]     Social Drivers of Health (SDOH) Social History: SDOH Screenings   Food Insecurity: No Food Insecurity (01/26/2024)  Housing: Low Risk  (01/26/2024)  Transportation Needs: No Transportation Needs (01/26/2024)  Utilities: Not At Risk (01/26/2024)  Alcohol Screen: Low Risk  (04/29/2018)  Depression (PHQ2-9): Medium Risk (01/25/2024)  Financial Resource Strain: Low Risk  (01/18/2024)  Social Connections: Unknown (01/26/2024)  Stress: No Stress Concern Present (01/18/2024)  Tobacco Use: Medium Risk (01/26/2024)  Health Literacy: Adequate Health Literacy (01/18/2024)   SDOH Interventions:     Readmission Risk Interventions     No data to display

## 2024-01-29 ENCOUNTER — Other Ambulatory Visit: Payer: Self-pay

## 2024-01-29 ENCOUNTER — Other Ambulatory Visit: Payer: Self-pay | Admitting: Radiology

## 2024-01-29 DIAGNOSIS — N1330 Unspecified hydronephrosis: Secondary | ICD-10-CM

## 2024-01-29 LAB — BASIC METABOLIC PANEL WITH GFR
Anion gap: 11 (ref 5–15)
BUN: 16 mg/dL (ref 8–23)
CO2: 19 mmol/L — ABNORMAL LOW (ref 22–32)
Calcium: 8.7 mg/dL — ABNORMAL LOW (ref 8.9–10.3)
Chloride: 107 mmol/L (ref 98–111)
Creatinine, Ser: 0.5 mg/dL (ref 0.44–1.00)
GFR, Estimated: >60 mL/min (ref >60)
Glucose, Bld: 133 mg/dL — ABNORMAL HIGH (ref 70–99)
Potassium: 4.4 mmol/L (ref 3.5–5.1)
Sodium: 137 mmol/L (ref 135–145)

## 2024-01-29 MED ORDER — MORPHINE SULFATE 15 MG PO TABS
15.0000 mg | ORAL_TABLET | ORAL | 0 refills | Status: DC | PRN
  Filled 2024-01-29: qty 15, 3d supply, fill #0

## 2024-01-29 MED ORDER — ADULT MULTIVITAMIN W/MINERALS CH: 1.0000 | ORAL_TABLET | Freq: Every day | ORAL | Status: DC

## 2024-01-29 MED ORDER — ACETAMINOPHEN 500 MG PO TABS
500.0000 mg | ORAL_TABLET | Freq: Four times a day (QID) | ORAL | 0 refills | Status: DC | PRN
  Filled 2024-01-29: qty 30, 8d supply, fill #0

## 2024-01-29 MED ORDER — MIRTAZAPINE 15 MG PO TABS
15.0000 mg | ORAL_TABLET | Freq: Every day | ORAL | 1 refills | Status: DC
  Filled 2024-01-29: qty 30, 30d supply, fill #0

## 2024-01-29 MED ORDER — PROCHLORPERAZINE MALEATE 5 MG PO TABS
5.0000 mg | ORAL_TABLET | Freq: Four times a day (QID) | ORAL | 0 refills | Status: DC | PRN
  Filled 2024-01-29: qty 18, 5d supply, fill #0

## 2024-01-29 NOTE — Progress Notes (Signed)
 Mobility Specialist - Progress Note   01/29/24 1104  Mobility  Activity Ambulated with assistance  Level of Assistance Standby assist, set-up cues, supervision of patient - no hands on  Assistive Device None  Distance Ambulated (ft) 320 ft  Activity Response Tolerated well  Mobility visit 1 Mobility  Mobility Specialist Start Time (ACUTE ONLY) 1017  Mobility Specialist Stop Time (ACUTE ONLY) 1029  Mobility Specialist Time Calculation (min) (ACUTE ONLY) 12 min   Pt supine upon entry, utilizing RA. Pt expressed feeling, however motivated and agreeable to OOB amb this date. Pt amb two laps around the NS w/ close supervision, tolerated well--- utilized railing 2 times during amb. Pt returned to the room, left supine with needs within reach.  America Silvan Mobility Specialist 01/29/24 11:07 AM

## 2024-01-29 NOTE — Care Management Important Message (Signed)
 Important Message  Patient Details  Name: Samantha Booker MRN: [id number] Date of Birth: 10-29-1950   Important Message Given:  Yes - Medicare IM     Romond Pipkins W, CMA 01/29/2024, 11:34 AM

## 2024-01-29 NOTE — Plan of Care (Signed)

## 2024-01-29 NOTE — Discharge Summary (Signed)
 Physician Discharge Summary   Patient: Samantha Booker MRN: [id number] DOB: 1950/06/03  Admit date:     01/25/2024  Discharge date: 01/29/24  Discharge Physician: Laree Lock   PCP: Auston Reyes BIRCH, MD   Recommendations at discharge:   Follow-up with PCP in 1 week -repeat BMP, mag, Hb  Follow-up with oncology outpatient on 10/07 Follow-up with interventional radiology outpatient for nephrostomy tube exchange Follow-up with urology outpatient in 43-months  Home PT/OT arranged  Discharge Diagnoses: Principal Problem:   Intractable cancer related abdominal pain Active Problems:   Intolerance/Intractable vomiting with oral opioids in therapeutic use, initial encounter   CT diagnosis (01/21/2024) of 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 Hospital course as below  Intractable cancer related abdominal pain Metastatic pancreatic cancer-omental and retroperitoneal IR - s/p Fluoro Guided PCN placement on right & CT Guided Biopsy of omental mass 09/30 which shows adenocarcinoma Underwent Celiac Plexus Block by IR - 10/01 Symptoms controlled, discharge on tylenol , po morphine, compazine Add Remeron for appetite Follow-up with oncology Dr. Babara outpatient on 10/07   Hydronephrosis of right kidney Hydronephrosis and hydroureter presumably obstructed vs compressed by metastatic disease Urology consult appreciated. S/p Fluoro Guided PCN placement on right by IR Continue nephrostomy tube for drainage Will need to follow-up with IR for routine nephrostomy tube exchanges Urology follow up in 3 months    Hypokalemia - resolved Follow up outpatient repeat labs  -- Home PT/OT   Pain control - [place]  Controlled Substance Reporting System database was reviewed. and patient was instructed, not to drive, operate heavy machinery, perform activities at heights, swimming or participation in water activities or provide baby-sitting services while on Pain, Sleep and Anxiety Medications; until their outpatient Physician has advised to do so again. Also recommended to not to take more than prescribed Pain, Sleep and Anxiety Medications.  Consultants: Intervention radiology, urology Procedures performed: s/p Fluoro Guided PCN placement on right & CT Guided Biopsy of omental mass 09/30  Underwent Celiac Plexus Block by IR - 10/01 Disposition: Home health Diet recommendation:  Discharge Diet Orders (From admission, onward)     Start     Ordered   01/29/24 0000  Diet - low sodium heart healthy        01/29/24 1432            DISCHARGE MEDICATION: Allergies as of 01/29/2024       Reactions   Penicillins Other (See Comments)   Ineffective for patient Did it involve swelling of the face/tongue/throat, SOB, or low BP? No Did it involve sudden or severe rash/hives, skin peeling, or any reaction on the inside of your mouth or nose? No Did you need to seek medical attention at a hospital or doctor's office? No When did it last happen?NEVER EXPERIENCED ANY REACTION FROM PENICILLIN     If all above answers are NO, may proceed with cephalosporin use.        Medication List     STOP taking these medications    celecoxib  200 MG capsule Commonly known as: CELEBREX    ibuprofen  600 MG tablet Commonly known as: ADVIL    LORazepam 0.5 MG tablet Commonly known as: ATIVAN  TAKE these medications    acetaminophen  500 MG tablet Commonly known as: TYLENOL  Take 1 tablet (500 mg total) by mouth every 6 (six) hours as needed (pain).   acyclovir cream 5 % Commonly known as:  ZOVIRAX Apply 1 application topically every 4 (four) hours as needed (for cold sores).   aspirin EC 81 MG tablet Take 1 tablet (81 mg total) by mouth daily.   atorvastatin  40 MG tablet Commonly known as: LIPITOR Take 1 tablet (40 mg total) by mouth at bedtime.   BIOTIN PO Take 1 tablet by mouth daily.   cholecalciferol 25 MCG (1000 UNIT) tablet Commonly known as: VITAMIN D3 Take 1,000 Units by mouth daily.   estradiol  0.1 MG/GM vaginal cream Commonly known as: ESTRACE  Place 0.5g nightly for two weeks then twice a week after   hydrocortisone  2.5 % rectal cream Commonly known as: ANUSOL -HC Place 1 application rectally 2 (two) times daily.   levothyroxine  100 MCG tablet Commonly known as: SYNTHROID  Take 100 mcg by mouth daily before breakfast.   mirtazapine 15 MG tablet Commonly known as: REMERON Take 1 tablet (15 mg total) by mouth at bedtime.   morphine 15 MG tablet Commonly known as: MSIR Take 1 tablet (15 mg total) by mouth every 4 (four) hours as needed for severe pain (pain score 7-10) or moderate pain (pain score 4-6).   pregabalin 75 MG capsule Commonly known as: LYRICA Take 75 mg by mouth 2 (two) times daily.   Premarin  1.25 MG tablet Generic drug: estrogens  (conjugated) Take 1.25 mg by mouth daily. Takes 1/4 of 1.25 mg daily   prochlorperazine 5 MG tablet Commonly known as: COMPAZINE Take 1 tablet (5 mg total) by mouth every 6 (six) hours as needed for refractory nausea / vomiting.   venlafaxine XR 75 MG 24 hr capsule Commonly known as: EFFEXOR-XR Take by mouth.        Follow-up Information     [facility]  Follow up.   Why: Agency will call to schedule the 1st PT/OT appointments. [address] [place] [address] [phone]                Discharge Exam: Samantha Booker   01/25/24 1534  Weight: 53.5 kg   General exam: Appears calm and comfortable, frail and  cachectic Respiratory system: Clear to auscultation. Respiratory effort normal Cardiovascular system: S1 & S2 heard, RRR. No JVD, murmurs, rubs, gallops or clicks. No pedal edema. Gastrointestinal system: Abdomen is soft, benign Central nervous system: Alert and oriented. No focal neurological deficits. Extremities: Symmetric 5 x 5 power. Skin: No rashes, lesions or ulcers  Condition at discharge: fair  The results of significant diagnostics from this hospitalization (including imaging, microbiology, ancillary and laboratory) are listed below for reference.   Imaging Studies: DG Chest 1 View Result Date: 01/28/2024 CLINICAL DATA:  Shortness of breath. EXAM: CHEST  1 VIEW COMPARISON:  None Available. FINDINGS: The heart size and mediastinal contours are within normal limits. Both lungs are clear. The visualized skeletal structures are unremarkable. IMPRESSION: No active disease. Electronically Signed   By: Lynwood Landy Raddle M.D.   On: 01/28/2024 16:33   CT CELIAC PLEXUS BLOCK NEUROLYTIC Result Date: 01/27/2024 CLINICAL DATA:  , Severe abdominal pain metastatic pancreatic carcinoma EXAM: CT GUIDED NEUROLYTIC ABLATION OF THE CELIAC AXIS ANESTHESIA/SEDATION: Intravenous Fentanyl  100mcg and Versed  1mg  were administered by RN during a total moderate (conscious) sedation time of 43 minutes; the patient's level of consciousness  and physiological / cardiorespiratory status were monitored continuously by radiology RN under my direct supervision. PROCEDURE: The procedure risks, benefits, and alternatives were explained to the patient. Questions regarding the procedure were encouraged and answered. The patient understands and consents to the procedure. The anterior abdominal wall was prepped with chlorhexidine  in a sterile fashion, and a sterile drape was applied covering the operative field. A sterile gown and sterile gloves were used for the procedure. Local anesthesia was provided with 1% Lidocaine . A 22 gauge  Chiba needle was advanced under CT guidance to the level of the celiac plexus to the left of midline. After confirming needle tip position, approximately three ml of a 1:10 dilution of Omnipaque -300 contrast and saline was injected. Unilateral retroperitoneal spread of diluted contrast material was confirmed by CT. A 22 gauge Chiba needle was advanced under CT guidance to the level of the celiac plexus to the right of midline. After confirming needle tip position, approximately three ml of a 1:10 dilution of Omnipaque -300 contrast and saline was injected. Spread of diluted contrast material was confirmed by CT. 20 mL lidocaine  1% was then administered divided evenly between the 2 sites. Patient observed for 10 minutes. Hemodynamic stability was confirmed. Subsequently, alcohol ablation of the celiac plexus was then performed with injection of 30 ml of absolute alcohol. COMPLICATIONS: None FINDINGS: Contrast injection showed spread bilaterally at the level of the celiac plexus. Celiac plexus block was successfully performed without complication. Alcohol neurolysis was performed without complication. IMPRESSION: CT guided block and neurolytic ablation of the celiac plexus as above. Electronically Signed   By: JONETTA Faes M.D.   On: 01/27/2024 16:19   IR NEPHROSTOMY PLACEMENT RIGHT Result Date: 01/26/2024 INDICATION: Right-sided hydronephrosis in a patient with metastatic pancreatic cancer. Increasing abdominal pain which may be related to urinary obstruction. EXAM: Nephrostomy tube placement COMPARISON:  None Available. ANESTHESIA/SEDATION: Moderate (conscious) sedation was employed during this procedure. A total of Versed  0.5 mg and Fentanyl  25 mcg was administered intravenously by the radiology nurse. Total intra-service moderate Sedation Time: 10 minutes. The patient's level of consciousness and vital signs were monitored continuously by radiology nursing throughout the procedure under my direct supervision.  CONTRAST:  10 mL Omnipaque  300-administered into the collecting system(s) FLUOROSCOPY: Radiation Exposure Index (as provided by the fluoroscopic device): 5 mGy Kerma COMPLICATIONS: None immediate. PROCEDURE: Informed written consent was obtained from the patient after a thorough discussion of the procedural risks, benefits and alternatives. All questions were addressed. Maximal Sterile Barrier Technique was utilized including caps, mask, sterile gowns, sterile gloves, sterile drape, hand hygiene and skin antiseptic. A timeout was performed prior to the initiation of the procedure. In a prone position on the IR table, the right flank region was prepped and draped in usual sterile fashion. Local anesthesia was achieved by infiltrating subcutaneous tissue to the level of the renal capsule with 1 percent lidocaine  under ultrasound guidance. A small incision was then made in the right flank region. Through the incision an Accustick needle was advanced with ultrasound guidance from the skin to the renal capsule and into a lower pole calyx. Calyx and renal pelvis are moderately dilated on ultrasound corresponding with findings from the recent CT of the abdomen and pelvis. The stylet was removed and clear urine was obtained at the hub of the needle. Small volume of contrast was injected demonstrating needle position within the renal collecting system. An 018 guidewire was then advanced under fluoroscopy and coiled in the renal pelvis. Access was then exchanged  over the guidewire for an Accustick introducer set. The stiffener and introducer were then removed as well as the guidewire and a short Amplatz wire was advanced under fluoroscopic guidance. The sheath was then removed and the access site was dilated using a 10 [place] dilator. A 10 French percutaneous nephrostomy tube was then advanced over the guidewire and into position within the renal pelvis as the stiffener and guidewire were retrieved. Retention suture and  sterile dressing applied. Catheter connected to gravity drainage. IMPRESSION: Satisfactory placement of a right-sided percutaneous nephrostomy tube (10 [place]). The patient will return in approximately 8 weeks for routine exchange. Electronically Signed   By: Cordella Banner   On: 01/26/2024 14:31   CT ABDOMINAL MASS BIOPSY Result Date: 01/26/2024 INDICATION: The patient has multiple mesenteric lymph nodes, pancreatic tail mass, and significant bilateral omental thickening. EXAM: CT-guided biopsy TECHNIQUE: Multidetector CT imaging of the abdomen was performed following the standard protocol without IV contrast. RADIATION DOSE REDUCTION: This exam was performed according to the departmental dose-optimization program which includes automated exposure control, adjustment of the mA and/or kV according to patient size and/or use of iterative reconstruction technique. MEDICATIONS: None. ANESTHESIA/SEDATION: Moderate (conscious) sedation was employed during this procedure. A total of Versed  1 mg and Fentanyl  50 mcg was administered intravenously by the radiology nurse. Total intra-service moderate Sedation Time: 15 minutes. The patient's level of consciousness and vital signs were monitored continuously by radiology nursing throughout the procedure under my direct supervision. COMPLICATIONS: None immediate. PROCEDURE: Informed written consent was obtained from the patient after a thorough discussion of the procedural risks, benefits and alternatives. All questions were addressed. Maximal Sterile Barrier Technique was utilized including caps, mask, sterile gowns, sterile gloves, sterile drape, hand hygiene and skin antiseptic. A timeout was performed prior to the initiation of the procedure. In a supine position with radiopaque markers on the abdomen, initial axial images of the abdomen were obtained. Images were compared to the previous study of the abdomen and measurements were then obtained using the expected target  region. The patient's skin was then prepped and draped in usual sterile fashion. Local anesthesia was achieved by infiltrating subcutaneous tissue to the rectus muscle with 1% lidocaine . A 17 gauge trocar was then advanced from the skin incision through the abdomen rectus oriented towards the omental thickening on the left side. Two 18 gauge samples were then obtained using the BioPince needle. Further imaging was obtained and the trocar was repositioned and a third and final 18 gauge sample was obtained. Samples placed in formalin and sent to pathology. All needles withdrawn from the patient and sterile dressing applied. IMPRESSION: Satisfactory CT-guided omental mass biopsy as described above. Electronically Signed   By: Cordella Banner   On: 01/26/2024 13:56   CT CHEST ABDOMEN PELVIS W CONTRAST Result Date: 01/25/2024 CLINICAL DATA:  Pancreatic mass, abdominal pain and bloating * Tracking Code: BO * EXAM: CT CHEST, ABDOMEN, AND PELVIS WITH CONTRAST TECHNIQUE: Multidetector CT imaging of the chest, abdomen and pelvis was performed following the standard protocol during bolus administration of intravenous contrast. RADIATION DOSE REDUCTION: This exam was performed according to the departmental dose-optimization program which includes automated exposure control, adjustment of the mA and/or kV according to patient size and/or use of iterative reconstruction technique. CONTRAST:  60mL OMNIPAQUE  IOHEXOL  300 MG/ML SOLN additional oral enteric contrast COMPARISON:  CT abdomen pelvis 01/12/2024 FINDINGS: CT CHEST FINDINGS Cardiovascular: Aortic atherosclerosis. Normal heart size. Three-vessel coronary artery calcifications. No pericardial effusion. Mediastinum/Nodes: No enlarged mediastinal, hilar,  or axillary lymph nodes. Trachea and esophagus demonstrate no significant findings. Lungs/Pleura: Occasional small bilateral pulmonary nodules, measuring up to 0.3 cm in the peripheral superior segment right lower lobe  (series 3, image 71). Background of extensive fine centrilobular nodularity throughout the lungs. Bandlike scarring of the bilateral lung bases. No pleural effusion or pneumothorax. Musculoskeletal: No chest wall abnormality. No acute osseous findings. CT ABDOMEN PELVIS FINDINGS Hepatobiliary: No solid liver abnormality is seen. Hepatic steatosis. Cholecystectomy. No biliary ductal dilatation. Pancreas: Hypoenhancing mass centered in the pancreatic body and proximal tail measuring 5.0 x 3.8 cm (series 2, image 69). No pancreatic ductal dilatation or surrounding inflammatory changes. Spleen: Normal in size without significant abnormality. Adrenals/Urinary Tract: Adrenal glands are unremarkable. Severe right hydronephrosis and hydroureter, the distal ureter presumably obstructed by metastatic disease, although nidus of obstruction not directly visualized. No calculi. No left-sided hydronephrosis. Small nonobstructive bilateral renal calculi. Bladder is unremarkable. Stomach/Bowel: Stomach is within normal limits. Appendix appears normal. No evidence of bowel wall thickening, distention, or inflammatory changes. Vascular/Lymphatic: Aortic atherosclerosis. Matted lymphadenopathy or soft tissue mass in the gastrohepatic ligament closely abutting the lesser curvature of the stomach, measuring 3.6 x 3.4 cm (series 2, image 66). Enlarged retroperitoneal lymph nodes measuring up to 1.5 x 1.3 cm (series 2, image 62). Reproductive: No mass or other abnormality. Other: No abdominal wall hernia or abnormality. Small volume ascites. Diffuse peritoneal nodularity with dense omental caking in the ventral abdomen. Musculoskeletal: No acute osseous findings. IMPRESSION: 1. Hypoenhancing mass centered in the pancreatic body and proximal tail measuring 5.0 x 3.8 cm, consistent with primary pancreatic adenocarcinoma. 2. Matted lymphadenopathy or soft tissue mass in the gastrohepatic ligament closely abutting the lesser curvature of the  stomach, measuring 3.6 x 3.4 cm. Enlarged retroperitoneal lymph nodes measuring up to 1.5 x 1.3 cm. 3. Diffuse peritoneal nodularity with dense omental caking in the ventral abdomen, consistent with peritoneal carcinomatosis. 4. Severe right hydronephrosis and hydroureter, the distal ureter presumably obstructed by metastatic disease, although nidus of obstruction not directly visualized. 5. Occasional small bilateral pulmonary nodules, measuring up to 0.3 cm in the peripheral superior segment right lower lobe, nonspecific although suspicious for small pulmonary metastases. Attention on follow-up. 6. Hepatic steatosis. 7. Coronary artery disease. Aortic Atherosclerosis (ICD10-I70.0). Electronically Signed   By: Marolyn JONETTA Jaksch M.D.   On: 01/25/2024 13:35   CT ABDOMEN PELVIS WO CONTRAST Result Date: 01/12/2024 CLINICAL DATA:  Abdominal pain EXAM: CT ABDOMEN AND PELVIS WITHOUT CONTRAST TECHNIQUE: Multidetector CT imaging of the abdomen and pelvis was performed following the standard protocol without IV contrast. RADIATION DOSE REDUCTION: This exam was performed according to the departmental dose-optimization program which includes automated exposure control, adjustment of the mA and/or kV according to patient size and/or use of iterative reconstruction technique. COMPARISON:  10/22/2018 FINDINGS: Lower chest: No acute abnormality Hepatobiliary: No focal liver abnormality is seen. Status post cholecystectomy. No biliary dilatation. Pancreas: Pancreatic body mass suspected measuring 4.1 x 2.8 cm with truncation/atrophy of the pancreatic tail beyond the mass. This is a new appearance when compared to prior study. No ductal dilatation. No surrounding inflammation. Spleen: No focal abnormality.  Normal size. Adrenals/Urinary Tract: Moderate right hydronephrosis and hydroureter. No visible obstructing stones. Area of cortical thinning/scarring in the mid to lower pole of the right kidney with parenchymal calcification,  stable since prior study. Punctate nonobstructing left lower pole stone. No hydronephrosis on the left. Adrenal glands and urinary bladder unremarkable. Stomach/Bowel: Stomach, large and small bowel grossly unremarkable. Vascular/Lymphatic: Heavily calcified  aorta and iliac vessels. No evidence of aneurysm or adenopathy. Reproductive: Prior hysterectomy.  No adnexal masses. Other: Free fluid in the pelvis. No free air. There are peritoneal nodules noted in the anterior and left abdomen. Omental thickening/nodularity noted. Musculoskeletal: No acute or suspicious focal osseous abnormality. Scoliosis and degenerative changes in the lumbar spine. IMPRESSION: Masslike area in the pancreatic body measuring 4.1 x 2.8 cm with atrophy of the pancreatic tail. Findings concerning for pancreatic mass/neoplasm. Recommend further evaluation with MRI. 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 Small to mild free fluid in the pelvis. Right hydronephrosis and hydroureter without visible obstructing stone. Left nephrolithiasis. Aortoiliac atherosclerosis. Electronically Signed   By: Franky Crease M.D.   On: 01/12/2024 13:34    Microbiology: Results for orders placed or performed during the hospital encounter of 07/19/19  SARS CORONAVIRUS 2 (TAT 6-24 HRS) Nasopharyngeal Nasopharyngeal Swab     Status: None   Collection Time: 07/19/19 11:01 AM   Specimen: Nasopharyngeal Swab  Result Value Ref Range Status   SARS Coronavirus 2 NEGATIVE NEGATIVE Final    Comment: (NOTE) SARS-CoV-2 target nucleic acids are NOT DETECTED. The SARS-CoV-2 RNA is generally detectable in upper and lower respiratory specimens during the acute phase of infection. Negative results do not preclude SARS-CoV-2 infection, do not rule out co-infections with other pathogens, and should not be used as the sole basis for treatment or other patient management decisions. Negative results must be combined with  clinical observations, patient history, and epidemiological information. The expected result is Negative. Fact Sheet for Patients: HairSlick.no Fact Sheet for Healthcare Providers: quierodirigir.com This test is not yet approved or cleared by the [place]  FDA and  has been authorized for detection and/or diagnosis of SARS-CoV-2 by FDA under an Emergency Use Authorization (EUA). This EUA will remain  in effect (meaning this test can be used) for the duration of the COVID-19 declaration under Section 56 4(b)(1) of the Act, 21 U.S.C. section 360bbb-3(b)(1), unless the authorization is terminated or revoked sooner. Performed at [facility], [address] [place], [place] [address]     Labs: CBC: Recent Labs  Lab 01/25/24 1718 01/27/24 0341 01/28/24 0611  WBC 10.0 10.1  --   NEUTROABS 7.2  --   --   HGB 12.5 12.2 13.8  HCT 37.7 36.1  --   MCV 90.8 90.5  --   PLT 242 221  --    Basic Metabolic Panel: Recent Labs  Lab 01/25/24 1718 01/27/24 0341 01/28/24 0611 01/29/24 0407  NA 139 138 137 137  K 3.3* 3.2* 3.9 4.4  CL 103 105 105 107  CO2 23 23 18* 19*  GLUCOSE 127* 105* 109* 133*  BUN 11 8 13 16   CREATININE 0.54 0.44 0.53 0.50  CALCIUM  9.1 8.3* 8.5* 8.7*  MG  --  1.7 1.9  --   PHOS  --  3.0  --   --    Liver Function Tests: Recent Labs  Lab 01/25/24 1718  AST 24  ALT 24  ALKPHOS 77  BILITOT 0.4  PROT 6.8  ALBUMIN 3.5   CBG: No results for input(s): GLUCAP in the last 168 hours.  Discharge time spent: greater than 30 minutes.  Signed: Laree Lock, MD [facility] 01/29/2024

## 2024-01-29 NOTE — Progress Notes (Signed)
 PT Cancellation Note  Patient Details Name: Samantha Booker MRN: [id number] DOB: 28-Sep-1950   Cancelled Treatment:    Reason Eval/Treat Not Completed: Other (comment) Pt declined. Pt reports that she does not need PT in this setting anymore. Pt states that she will continue PT at home. Per chart review pt walked 320 feet with mobility specialist earlier today with stand by assist. Pt appears to be at baseline at this time. PT will sign off.   Brihana Quickel A Cordero Surette 01/29/2024, 12:11 PM

## 2024-01-29 NOTE — Progress Notes (Signed)
 Initial Nutrition Assessment  DOCUMENTATION CODES:   Not applicable  INTERVENTION:   -Continue soft diet -D/c Boost Breeze po TID, each supplement provides 250 kcal and 9 grams of protein  -Continue Ensure Plus High Protein po BID, each supplement provides 350 kcal and 20 grams of protein  -MVI with minerals daily   NUTRITION DIAGNOSIS:   Increased nutrient needs related to chronic illness (pancreatic cancer) as evidenced by estimated needs.  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 Attempted to speak with pt via call to hospital room phone, however, unable to reach. RD unable to obtain further nutrition-related history or complete nutrition-focused physical exam at this time.    Pt currently on a soft diet. Noted meal completions 50-100%. She is refusing Boost Breeze supplements, but taking Ensure.   Wt has been stable over the past year.   Medications reviewed and include remeron  Labs reviewed.    Diet Order:   Diet Order             DIET SOFT Room service appropriate? Yes; Fluid consistency: Thin  Diet effective now                   EDUCATION NEEDS:   No education needs  have been identified at this time  Skin:  Skin Assessment: Reviewed RN Assessment  Last BM:  01/26/24  Height:   Ht Readings from Last 1 Encounters:  01/25/24 5' 2 (1.575 m)    Weight:   Wt Readings from Last 1 Encounters:  01/25/24 53.5 kg    Ideal Body Weight:  50 kg  BMI:  Body mass index is 21.58 kg/m.  Estimated Nutritional Needs:   Kcal:  1600-1800  Protein:  85-100 grams  Fluid:  1.6-1.8 L    Margery ORN, RD, LDN, CDCES Registered Dietitian III Certified Diabetes Care and Education Specialist If unable to reach this RD, please use RD Inpatient group chat on secure chat between hours of 8am-4 pm daily

## 2024-02-01 DIAGNOSIS — C259 Malignant neoplasm of pancreas, unspecified: Secondary | ICD-10-CM | POA: Diagnosis not present

## 2024-02-01 DIAGNOSIS — Z85828 Personal history of other malignant neoplasm of skin: Secondary | ICD-10-CM | POA: Diagnosis not present

## 2024-02-01 DIAGNOSIS — Z87891 Personal history of nicotine dependence: Secondary | ICD-10-CM | POA: Diagnosis not present

## 2024-02-01 DIAGNOSIS — M5030 Other cervical disc degeneration, unspecified cervical region: Secondary | ICD-10-CM | POA: Diagnosis not present

## 2024-02-01 DIAGNOSIS — N1330 Unspecified hydronephrosis: Secondary | ICD-10-CM | POA: Diagnosis not present

## 2024-02-01 DIAGNOSIS — E785 Hyperlipidemia, unspecified: Secondary | ICD-10-CM | POA: Diagnosis not present

## 2024-02-01 DIAGNOSIS — E039 Hypothyroidism, unspecified: Secondary | ICD-10-CM | POA: Diagnosis not present

## 2024-02-01 DIAGNOSIS — C786 Secondary malignant neoplasm of retroperitoneum and peritoneum: Secondary | ICD-10-CM | POA: Diagnosis not present

## 2024-02-01 DIAGNOSIS — M8580 Other specified disorders of bone density and structure, unspecified site: Secondary | ICD-10-CM | POA: Diagnosis not present

## 2024-02-01 DIAGNOSIS — Z79891 Long term (current) use of opiate analgesic: Secondary | ICD-10-CM | POA: Diagnosis not present

## 2024-02-01 DIAGNOSIS — I700 Atherosclerosis of aorta: Secondary | ICD-10-CM | POA: Diagnosis not present

## 2024-02-02 ENCOUNTER — Encounter: Payer: Self-pay | Admitting: Oncology

## 2024-02-02 ENCOUNTER — Inpatient Hospital Stay

## 2024-02-02 ENCOUNTER — Telehealth: Payer: Self-pay

## 2024-02-02 ENCOUNTER — Inpatient Hospital Stay (HOSPITAL_BASED_OUTPATIENT_CLINIC_OR_DEPARTMENT_OTHER): Admitting: Hospice and Palliative Medicine

## 2024-02-02 ENCOUNTER — Inpatient Hospital Stay (HOSPITAL_BASED_OUTPATIENT_CLINIC_OR_DEPARTMENT_OTHER): Admitting: Oncology

## 2024-02-02 VITALS — BP 143/90 | HR 90 | Temp 98.6°F | Resp 20 | Wt 118.5 lb

## 2024-02-02 DIAGNOSIS — C786 Secondary malignant neoplasm of retroperitoneum and peritoneum: Secondary | ICD-10-CM | POA: Diagnosis not present

## 2024-02-02 DIAGNOSIS — C251 Malignant neoplasm of body of pancreas: Secondary | ICD-10-CM | POA: Diagnosis not present

## 2024-02-02 DIAGNOSIS — K8689 Other specified diseases of pancreas: Secondary | ICD-10-CM

## 2024-02-02 DIAGNOSIS — G893 Neoplasm related pain (acute) (chronic): Secondary | ICD-10-CM

## 2024-02-02 DIAGNOSIS — N1330 Unspecified hydronephrosis: Secondary | ICD-10-CM | POA: Diagnosis not present

## 2024-02-02 DIAGNOSIS — Z5111 Encounter for antineoplastic chemotherapy: Secondary | ICD-10-CM | POA: Diagnosis not present

## 2024-02-02 DIAGNOSIS — Z515 Encounter for palliative care: Secondary | ICD-10-CM

## 2024-02-02 DIAGNOSIS — Z803 Family history of malignant neoplasm of breast: Secondary | ICD-10-CM | POA: Diagnosis not present

## 2024-02-02 DIAGNOSIS — Z66 Do not resuscitate: Secondary | ICD-10-CM | POA: Diagnosis not present

## 2024-02-02 DIAGNOSIS — N132 Hydronephrosis with renal and ureteral calculous obstruction: Secondary | ICD-10-CM | POA: Diagnosis not present

## 2024-02-02 DIAGNOSIS — Z808 Family history of malignant neoplasm of other organs or systems: Secondary | ICD-10-CM | POA: Diagnosis not present

## 2024-02-02 DIAGNOSIS — Z23 Encounter for immunization: Secondary | ICD-10-CM | POA: Diagnosis not present

## 2024-02-02 DIAGNOSIS — Z87891 Personal history of nicotine dependence: Secondary | ICD-10-CM | POA: Diagnosis not present

## 2024-02-02 MED ORDER — PROCHLORPERAZINE MALEATE 10 MG PO TABS: 10.0000 mg | ORAL_TABLET | Freq: Four times a day (QID) | ORAL | 1 refills | Status: DC | PRN

## 2024-02-02 MED ORDER — ONDANSETRON HCL 8 MG PO TABS: 8.0000 mg | ORAL_TABLET | Freq: Three times a day (TID) | ORAL | 1 refills | Status: DC | PRN

## 2024-02-02 MED ORDER — INFLUENZA VAC SPLIT HIGH-DOSE 0.5 ML IM SUSY
0.5000 mL | PREFILLED_SYRINGE | Freq: Once | INTRAMUSCULAR | Status: AC
  Administered 2024-02-02: 0.5 mL via INTRAMUSCULAR
  Filled 2024-02-02: qty 0.5

## 2024-02-02 NOTE — Progress Notes (Signed)
 Palliative Medicine [facility] at [facility] [phone] [phone]) [phone]   Name: Samantha Booker Date: 02/02/2024 MRN: [id number]  DOB: [birth year]  Patient Care Team: Auston Reyes BIRCH, MD as PCP - General (Internal Medicine) Babara Call, MD as Consulting Physician (Oncology) Maurie Rayfield BIRCH, RN as Oncology Nurse Navigator    REASON FOR CONSULTATION: Samantha Booker is a 73 y.o. female with multiple medical problems including pancreatic tail mass and peritoneal/omental nodularity concerning for pancreatic cancer with metastasis.  Patient has had significant abdominal pain.  She was referred to palliative care to address goals and manage ongoing symptoms.  SOCIAL HISTORY:     reports that she quit smoking about 32 years ago. Her smoking use included cigarettes. She started smoking about 52 years ago. She has a 40 pack-year smoking history. She has never used smokeless tobacco. She reports current alcohol use of about 2.0 standard drinks of alcohol per week. She reports that she does not use drugs.  Patient is married lives at home with her husband  ADVANCE DIRECTIVES:    CODE STATUS:   PAST MEDICAL HISTORY: Past Medical History:  Diagnosis Date   Actinic keratosis 10/30/2021   R forearm LN2 05/15/2022   Actinic keratosis 05/15/2022   left upper arm, treated with LN2 05/20/2022   Atherosclerosis of aorta 06/25/2016   Imaging Feb 2018   DDD (degenerative disc disease), cervical    Foliclar lymph grade III, unsp, nodes of head, face, and nk (HCC)    Hepatitis 1972   Hep B   History of basal cell carcinoma (BCC) 10/30/2021   right popliteal fossa BCC NODULAR PATTERN, BASE INVOLVED  treated with ED&C   History of hepatitis B    History of kidney stones    in past   Hyperlipidemia    Hypothyroidism    Pneumonia    as child    PAST SURGICAL HISTORY:  Past Surgical History:  Procedure Laterality Date   ANTERIOR AND POSTERIOR  REPAIR WITH SACROSPINOUS FIXATION N/A 01/12/2023   Procedure: ANTERIOR AND POSTERIOR REPAIR WITH SACROSPINOUS FIXATION;  Surgeon: Marilynne Rosaline SAILOR, MD;  Location: [facility];  Service: Gynecology;  Laterality: N/A;  Total time requested is 1.5 hrs   APPENDECTOMY  1979   BOTOX  INJECTION N/A 09/24/2021   Procedure: BOTOX  INJECTION;  Surgeon: Rodolph Romano, MD;  Location: [facility];  Service: General;  Laterality: N/A;   CATARACT EXTRACTION Bilateral 2019   CHOLECYSTECTOMY  2011   COLONOSCOPY WITH PROPOFOL  N/A 05/20/2018   Procedure: COLONOSCOPY WITH PROPOFOL ;  Surgeon: Unk Corinn Skiff, MD;  Location: [facility];  Service: Gastroenterology;  Laterality: N/A;   CYSTOSCOPY  01/12/2023   Procedure: CYSTOSCOPY;  Surgeon: Marilynne Rosaline SAILOR, MD;  Location: [facility];  Service: Gynecology;;   ESOPHAGOGASTRODUODENOSCOPY (EGD) WITH PROPOFOL  N/A 07/21/2019   Procedure: ESOPHAGOGASTRODUODENOSCOPY (EGD) WITH PROPOFOL ;  Surgeon: Unk Corinn Skiff, MD;  Location: [facility];  Service: Endoscopy;  Laterality: N/A;   EYE SURGERY Left 10/05/2017   HEMORRHOID SURGERY N/A 09/24/2021   Procedure: HEMORRHOIDECTOMY;  Surgeon: Rodolph Romano, MD;  Location: [facility];  Service: General;  Laterality: N/A;   INGUINAL HERNIA REPAIR Left 12/15/2018   Procedure: LAPAROSCOPIC LEFT INGUINAL HERNIA VS OPEN;  Surgeon: Rodolph Romano, MD;  Location: [facility];  Service: General;  Laterality: Left;   IR NEPHROSTOMY PLACEMENT RIGHT  01/26/2024   LAPAROSCOPIC UNILATERAL SALPINGO OOPHERECTOMY Right 12/15/2018   Procedure: LAPAROSCOPIC UNILATERAL OOPHORECTOMY;  Surgeon:  Ward, Mitzie BROCKS, MD;  Location: [facility];  Service: Gynecology;  Laterality: Right;   PERINEOPLASTY N/A 01/12/2023   Procedure: PERINEOPLASTY;  Surgeon: Marilynne Rosaline SAILOR, MD;  Location: [facility];  Service: Gynecology;  Laterality: N/A;   ROOT CANAL     suburethral sling   2012   Dr. Barbaraann   VAGINAL HYSTERECTOMY  1980   precancerous cells    HEMATOLOGY/ONCOLOGY HISTORY:  Oncology History   No history exists.    ALLERGIES:  is allergic to penicillins.  MEDICATIONS:  Current Outpatient Medications  Medication Sig Dispense Refill   acetaminophen  (TYLENOL ) 500 MG tablet Take 1 tablet (500 mg total) by mouth every 6 (six) hours as needed (pain). 30 tablet 0   acyclovir cream (ZOVIRAX) 5 % Apply 1 application topically every 4 (four) hours as needed (for cold sores).  (Patient not taking: Reported on 02/02/2024)     aspirin EC 81 MG tablet Take 1 tablet (81 mg total) by mouth daily. (Patient not taking: Reported on 02/02/2024)     atorvastatin  (LIPITOR) 40 MG tablet Take 1 tablet (40 mg total) by mouth at bedtime. 90 tablet 1   BIOTIN PO Take 1 tablet by mouth daily. (Patient not taking: Reported on 02/02/2024)     cholecalciferol (VITAMIN D3) 25 MCG (1000 UT) tablet Take 1,000 Units by mouth daily. (Patient not taking: Reported on 02/02/2024)     estradiol  (ESTRACE ) 0.1 MG/GM vaginal cream Place 0.5g nightly for two weeks then twice a week after (Patient not taking: Reported on 02/02/2024) 30 g 11   hydrocortisone  (ANUSOL -HC) 2.5 % rectal cream Place 1 application rectally 2 (two) times daily. (Patient not taking: Reported on 02/02/2024) 30 g 1   levothyroxine  (SYNTHROID ) 100 MCG tablet Take 100 mcg by mouth daily before breakfast.     mirtazapine (REMERON) 15 MG tablet Take 1 tablet (15 mg total) by mouth at bedtime. 30 tablet 1   morphine (MSIR) 15 MG tablet Take 1 tablet (15 mg total) by mouth every 4 (four) hours as needed for severe pain (pain score 7-10) or moderate pain (pain score 4-6). 15 tablet 0   pregabalin (LYRICA) 75 MG capsule Take 75 mg by mouth 2 (two) times daily.     PREMARIN  1.25 MG tablet Take 1.25 mg by mouth daily. Takes 1/4 of 1.25 mg daily (Patient not taking: Reported on 02/02/2024)     prochlorperazine (COMPAZINE) 5 MG tablet Take 1 tablet  (5 mg total) by mouth every 6 (six) hours as needed for refractory nausea / vomiting. 30 tablet 0   venlafaxine XR (EFFEXOR-XR) 75 MG 24 hr capsule Take by mouth.     No current facility-administered medications for this visit.    VITAL SIGNS: There were no vitals taken for this visit. There were no vitals filed for this visit.   Estimated body mass index is 21.67 kg/m as calculated from the following:   Height as of 01/25/24: 5' 2 (1.575 m).   Weight as of an earlier encounter on 02/02/24: 118 lb 8 oz (53.8 kg).  LABS: CBC:    Component Value Date/Time   WBC 10.1 01/27/2024 0341   HGB 13.8 01/28/2024 0611   HGB 13.6 06/10/2016 0828   HCT 36.1 01/27/2024 0341   HCT 40.7 06/10/2016 0828   PLT 221 01/27/2024 0341   PLT 238 06/10/2016 0828   MCV 90.5 01/27/2024 0341   MCV 92 06/10/2016 0828   NEUTROABS 7.2 01/25/2024 1718   NEUTROABS 4.6 06/10/2016  0828   LYMPHSABS 1.5 01/25/2024 1718   LYMPHSABS 2.2 06/10/2016 0828   MONOABS 1.2 (H) 01/25/2024 1718   EOSABS 0.1 01/25/2024 1718   EOSABS 0.3 06/10/2016 0828   BASOSABS 0.1 01/25/2024 1718   BASOSABS 0.1 06/10/2016 0828   Comprehensive Metabolic Panel:    Component Value Date/Time   NA 137 01/29/2024 0407   NA 144 06/10/2016 0828   K 4.4 01/29/2024 0407   CL 107 01/29/2024 0407   CO2 19 (L) 01/29/2024 0407   BUN 16 01/29/2024 0407   BUN 11 06/10/2016 0828   CREATININE 0.50 01/29/2024 0407   CREATININE 0.63 06/24/2018 1146   GLUCOSE 133 (H) 01/29/2024 0407   CALCIUM  8.7 (L) 01/29/2024 0407   AST 24 01/25/2024 1718   ALT 24 01/25/2024 1718   ALKPHOS 77 01/25/2024 1718   BILITOT 0.4 01/25/2024 1718   BILITOT 0.3 06/10/2016 0828   PROT 6.8 01/25/2024 1718   PROT 6.4 06/10/2016 0828   ALBUMIN 3.5 01/25/2024 1718   ALBUMIN 4.1 06/10/2016 0828    RADIOGRAPHIC STUDIES: DG Chest 1 View Result Date: 01/28/2024 CLINICAL DATA:  Shortness of breath. EXAM: CHEST  1 VIEW COMPARISON:  None Available. FINDINGS: The heart size  and mediastinal contours are within normal limits. Both lungs are clear. The visualized skeletal structures are unremarkable. IMPRESSION: No active disease. Electronically Signed   By: Lynwood Landy Raddle M.D.   On: 01/28/2024 16:33   CT CELIAC PLEXUS BLOCK NEUROLYTIC Result Date: 01/27/2024 CLINICAL DATA:  , Severe abdominal pain metastatic pancreatic carcinoma EXAM: CT GUIDED NEUROLYTIC ABLATION OF THE CELIAC AXIS ANESTHESIA/SEDATION: Intravenous Fentanyl  100mcg and Versed  1mg  were administered by RN during a total moderate (conscious) sedation time of 43 minutes; the patient's level of consciousness and physiological / cardiorespiratory status were monitored continuously by radiology RN under my direct supervision. PROCEDURE: The procedure risks, benefits, and alternatives were explained to the patient. Questions regarding the procedure were encouraged and answered. The patient understands and consents to the procedure. The anterior abdominal wall was prepped with chlorhexidine  in a sterile fashion, and a sterile drape was applied covering the operative field. A sterile gown and sterile gloves were used for the procedure. Local anesthesia was provided with 1% Lidocaine . A 22 gauge Chiba needle was advanced under CT guidance to the level of the celiac plexus to the left of midline. After confirming needle tip position, approximately three ml of a 1:10 dilution of Omnipaque -300 contrast and saline was injected. Unilateral retroperitoneal spread of diluted contrast material was confirmed by CT. A 22 gauge Chiba needle was advanced under CT guidance to the level of the celiac plexus to the right of midline. After confirming needle tip position, approximately three ml of a 1:10 dilution of Omnipaque -300 contrast and saline was injected. Spread of diluted contrast material was confirmed by CT. 20 mL lidocaine  1% was then administered divided evenly between the 2 sites. Patient observed for 10 minutes. Hemodynamic  stability was confirmed. Subsequently, alcohol ablation of the celiac plexus was then performed with injection of 30 ml of absolute alcohol. COMPLICATIONS: None FINDINGS: Contrast injection showed spread bilaterally at the level of the celiac plexus. Celiac plexus block was successfully performed without complication. Alcohol neurolysis was performed without complication. IMPRESSION: CT guided block and neurolytic ablation of the celiac plexus as above. Electronically Signed   By: JONETTA Faes M.D.   On: 01/27/2024 16:19   IR NEPHROSTOMY PLACEMENT RIGHT Result Date: 01/26/2024 INDICATION: Right-sided hydronephrosis in a patient with metastatic  pancreatic cancer. Increasing abdominal pain which may be related to urinary obstruction. EXAM: Nephrostomy tube placement COMPARISON:  None Available. ANESTHESIA/SEDATION: Moderate (conscious) sedation was employed during this procedure. A total of Versed  0.5 mg and Fentanyl  25 mcg was administered intravenously by the radiology nurse. Total intra-service moderate Sedation Time: 10 minutes. The patient's level of consciousness and vital signs were monitored continuously by radiology nursing throughout the procedure under my direct supervision. CONTRAST:  10 mL Omnipaque  300-administered into the collecting system(s) FLUOROSCOPY: Radiation Exposure Index (as provided by the fluoroscopic device): 5 mGy Kerma COMPLICATIONS: None immediate. PROCEDURE: Informed written consent was obtained from the patient after a thorough discussion of the procedural risks, benefits and alternatives. All questions were addressed. Maximal Sterile Barrier Technique was utilized including caps, mask, sterile gowns, sterile gloves, sterile drape, hand hygiene and skin antiseptic. A timeout was performed prior to the initiation of the procedure. In a prone position on the IR table, the right flank region was prepped and draped in usual sterile fashion. Local anesthesia was achieved by infiltrating  subcutaneous tissue to the level of the renal capsule with 1 percent lidocaine  under ultrasound guidance. A small incision was then made in the right flank region. Through the incision an Accustick needle was advanced with ultrasound guidance from the skin to the renal capsule and into a lower pole calyx. Calyx and renal pelvis are moderately dilated on ultrasound corresponding with findings from the recent CT of the abdomen and pelvis. The stylet was removed and clear urine was obtained at the hub of the needle. Small volume of contrast was injected demonstrating needle position within the renal collecting system. An 018 guidewire was then advanced under fluoroscopy and coiled in the renal pelvis. Access was then exchanged over the guidewire for an Accustick introducer set. The stiffener and introducer were then removed as well as the guidewire and a short Amplatz wire was advanced under fluoroscopic guidance. The sheath was then removed and the access site was dilated using a 10 [place] dilator. A 10 French percutaneous nephrostomy tube was then advanced over the guidewire and into position within the renal pelvis as the stiffener and guidewire were retrieved. Retention suture and sterile dressing applied. Catheter connected to gravity drainage. IMPRESSION: Satisfactory placement of a right-sided percutaneous nephrostomy tube (10 [place]). The patient will return in approximately 8 weeks for routine exchange. Electronically Signed   By: Cordella Banner   On: 01/26/2024 14:31   CT ABDOMINAL MASS BIOPSY Result Date: 01/26/2024 INDICATION: The patient has multiple mesenteric lymph nodes, pancreatic tail mass, and significant bilateral omental thickening. EXAM: CT-guided biopsy TECHNIQUE: Multidetector CT imaging of the abdomen was performed following the standard protocol without IV contrast. RADIATION DOSE REDUCTION: This exam was performed according to the departmental dose-optimization program which includes  automated exposure control, adjustment of the mA and/or kV according to patient size and/or use of iterative reconstruction technique. MEDICATIONS: None. ANESTHESIA/SEDATION: Moderate (conscious) sedation was employed during this procedure. A total of Versed  1 mg and Fentanyl  50 mcg was administered intravenously by the radiology nurse. Total intra-service moderate Sedation Time: 15 minutes. The patient's level of consciousness and vital signs were monitored continuously by radiology nursing throughout the procedure under my direct supervision. COMPLICATIONS: None immediate. PROCEDURE: Informed written consent was obtained from the patient after a thorough discussion of the procedural risks, benefits and alternatives. All questions were addressed. Maximal Sterile Barrier Technique was utilized including caps, mask, sterile gowns, sterile gloves, sterile drape, hand hygiene and skin  antiseptic. A timeout was performed prior to the initiation of the procedure. In a supine position with radiopaque markers on the abdomen, initial axial images of the abdomen were obtained. Images were compared to the previous study of the abdomen and measurements were then obtained using the expected target region. The patient's skin was then prepped and draped in usual sterile fashion. Local anesthesia was achieved by infiltrating subcutaneous tissue to the rectus muscle with 1% lidocaine . A 17 gauge trocar was then advanced from the skin incision through the abdomen rectus oriented towards the omental thickening on the left side. Two 18 gauge samples were then obtained using the BioPince needle. Further imaging was obtained and the trocar was repositioned and a third and final 18 gauge sample was obtained. Samples placed in formalin and sent to pathology. All needles withdrawn from the patient and sterile dressing applied. IMPRESSION: Satisfactory CT-guided omental mass biopsy as described above. Electronically Signed   By: Cordella Banner   On: 01/26/2024 13:56   CT CHEST ABDOMEN PELVIS W CONTRAST Result Date: 01/25/2024 CLINICAL DATA:  Pancreatic mass, abdominal pain and bloating * Tracking Code: BO * EXAM: CT CHEST, ABDOMEN, AND PELVIS WITH CONTRAST TECHNIQUE: Multidetector CT imaging of the chest, abdomen and pelvis was performed following the standard protocol during bolus administration of intravenous contrast. RADIATION DOSE REDUCTION: This exam was performed according to the departmental dose-optimization program which includes automated exposure control, adjustment of the mA and/or kV according to patient size and/or use of iterative reconstruction technique. CONTRAST:  60mL OMNIPAQUE  IOHEXOL  300 MG/ML SOLN additional oral enteric contrast COMPARISON:  CT abdomen pelvis 01/12/2024 FINDINGS: CT CHEST FINDINGS Cardiovascular: Aortic atherosclerosis. Normal heart size. Three-vessel coronary artery calcifications. No pericardial effusion. Mediastinum/Nodes: No enlarged mediastinal, hilar, or axillary lymph nodes. Trachea and esophagus demonstrate no significant findings. Lungs/Pleura: Occasional small bilateral pulmonary nodules, measuring up to 0.3 cm in the peripheral superior segment right lower lobe (series 3, image 71). Background of extensive fine centrilobular nodularity throughout the lungs. Bandlike scarring of the bilateral lung bases. No pleural effusion or pneumothorax. Musculoskeletal: No chest wall abnormality. No acute osseous findings. CT ABDOMEN PELVIS FINDINGS Hepatobiliary: No solid liver abnormality is seen. Hepatic steatosis. Cholecystectomy. No biliary ductal dilatation. Pancreas: Hypoenhancing mass centered in the pancreatic body and proximal tail measuring 5.0 x 3.8 cm (series 2, image 69). No pancreatic ductal dilatation or surrounding inflammatory changes. Spleen: Normal in size without significant abnormality. Adrenals/Urinary Tract: Adrenal glands are unremarkable. Severe right hydronephrosis and  hydroureter, the distal ureter presumably obstructed by metastatic disease, although nidus of obstruction not directly visualized. No calculi. No left-sided hydronephrosis. Small nonobstructive bilateral renal calculi. Bladder is unremarkable. Stomach/Bowel: Stomach is within normal limits. Appendix appears normal. No evidence of bowel wall thickening, distention, or inflammatory changes. Vascular/Lymphatic: Aortic atherosclerosis. Matted lymphadenopathy or soft tissue mass in the gastrohepatic ligament closely abutting the lesser curvature of the stomach, measuring 3.6 x 3.4 cm (series 2, image 66). Enlarged retroperitoneal lymph nodes measuring up to 1.5 x 1.3 cm (series 2, image 62). Reproductive: No mass or other abnormality. Other: No abdominal wall hernia or abnormality. Small volume ascites. Diffuse peritoneal nodularity with dense omental caking in the ventral abdomen. Musculoskeletal: No acute osseous findings. IMPRESSION: 1. Hypoenhancing mass centered in the pancreatic body and proximal tail measuring 5.0 x 3.8 cm, consistent with primary pancreatic adenocarcinoma. 2. Matted lymphadenopathy or soft tissue mass in the gastrohepatic ligament closely abutting the lesser curvature of the stomach, measuring 3.6 x 3.4 cm.  Enlarged retroperitoneal lymph nodes measuring up to 1.5 x 1.3 cm. 3. Diffuse peritoneal nodularity with dense omental caking in the ventral abdomen, consistent with peritoneal carcinomatosis. 4. Severe right hydronephrosis and hydroureter, the distal ureter presumably obstructed by metastatic disease, although nidus of obstruction not directly visualized. 5. Occasional small bilateral pulmonary nodules, measuring up to 0.3 cm in the peripheral superior segment right lower lobe, nonspecific although suspicious for small pulmonary metastases. Attention on follow-up. 6. Hepatic steatosis. 7. Coronary artery disease. Aortic Atherosclerosis (ICD10-I70.0). Electronically Signed   By: Marolyn JONETTA Jaksch  M.D.   On: 01/25/2024 13:35   CT ABDOMEN PELVIS WO CONTRAST Result Date: 01/12/2024 CLINICAL DATA:  Abdominal pain EXAM: CT ABDOMEN AND PELVIS WITHOUT CONTRAST TECHNIQUE: Multidetector CT imaging of the abdomen and pelvis was performed following the standard protocol without IV contrast. RADIATION DOSE REDUCTION: This exam was performed according to the departmental dose-optimization program which includes automated exposure control, adjustment of the mA and/or kV according to patient size and/or use of iterative reconstruction technique. COMPARISON:  10/22/2018 FINDINGS: Lower chest: No acute abnormality Hepatobiliary: No focal liver abnormality is seen. Status post cholecystectomy. No biliary dilatation. Pancreas: Pancreatic body mass suspected measuring 4.1 x 2.8 cm with truncation/atrophy of the pancreatic tail beyond the mass. This is a new appearance when compared to prior study. No ductal dilatation. No surrounding inflammation. Spleen: No focal abnormality.  Normal size. Adrenals/Urinary Tract: Moderate right hydronephrosis and hydroureter. No visible obstructing stones. Area of cortical thinning/scarring in the mid to lower pole of the right kidney with parenchymal calcification, stable since prior study. Punctate nonobstructing left lower pole stone. No hydronephrosis on the left. Adrenal glands and urinary bladder unremarkable. Stomach/Bowel: Stomach, large and small bowel grossly unremarkable. Vascular/Lymphatic: Heavily calcified aorta and iliac vessels. No evidence of aneurysm or adenopathy. Reproductive: Prior hysterectomy.  No adnexal masses. Other: Free fluid in the pelvis. No free air. There are peritoneal nodules noted in the anterior and left abdomen. Omental thickening/nodularity noted. Musculoskeletal: No acute or suspicious focal osseous abnormality. Scoliosis and degenerative changes in the lumbar spine. IMPRESSION: Masslike area in the pancreatic body measuring 4.1 x 2.8 cm with atrophy  of the pancreatic tail. Findings concerning for pancreatic mass/neoplasm. Recommend further evaluation with MRI. 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 Small to mild free fluid in the pelvis. Right hydronephrosis and hydroureter without visible obstructing stone. Left nephrolithiasis. Aortoiliac atherosclerosis. Electronically Signed   By: Franky Crease M.D.   On: 01/12/2024 13:34    PERFORMANCE STATUS (ECOG) : 3 - Symptomatic, >50% confined to bed  Review of Systems Unless otherwise noted, a complete review of systems is negative.  Physical Exam General: Thin, frail-appearing Cardiovascular: regular rate and rhythm Pulmonary: Unlabored Extremities: no edema, no joint deformities Skin: no rashes Neurological: Weakness but otherwise nonfocal  IMPRESSION: Follow-up visit.  Patient accompanied by her husband.  Patient was hospitalized with intractable abdominal pain.  She underwent celiac plexus block. Patient now reports that pain is significantly improved.  She was discharged home on MS IR, which she is taking but infrequently.  She says that she is tolerating it although she does not like to take medications.  Patient denies other distressing symptoms at present.  She is tearful today as she verbalized understanding that her prognosis is poor.  However, she is in agreement with current plan for chemotherapy.  PLAN: - Continue current scope of treatment - Continue MS IR as needed for pain - RTC TBD  Time Total: 15 minutes  Visit consisted of counseling and education dealing with the complex and emotionally intense issues of symptom management and palliative care in the setting of serious and potentially life-threatening illness.Greater than 50%  of this time was spent counseling and coordinating care related to the above assessment and plan.  Signed by: Fonda Mower, PhD, NP-C

## 2024-02-02 NOTE — Assessment & Plan Note (Addendum)
 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 Baseline CA [id number]  The diagnosis and care plan were discussed with patient in detail.   Patient understands that her condition is not curable.  Treatment with palliative intent.  She desires palliative care chemotherapy.  Recommend gemcitabine +/- Abraxane The goal of treatment which is to palliate disease, disease related symptoms, improve quality of life and hopefully prolong life was highlighted in our discussion.  Chemotherapy education was provided.  We had discussed the composition of chemotherapy regimen, length of chemo cycle, duration of treatment and the time to assess response to treatment.    I explained to the patient the risks and benefits of chemotherapy including all but not limited to hair loss, mouth sore, nausea, vomiting, diarrhea, low blood counts, bleeding, neuropathy and risk of life threatening infection and even death, secondary malignancy etc.  . Patient voices understanding and willing to proceed chemotherapy.   # Chemotherapy education; option of Mediport placement was discussed with patient.  She declined.. Antiemetics-Zofran  and Compazine; EMLA cream sent to pharmacy # send NGS  Supportive care measures are necessary for patient well-being and will be provided as necessary. We spent sufficient time to discuss many aspect of care, questions were answered to patient's satisfaction.

## 2024-02-02 NOTE — Telephone Encounter (Signed)
 [facility] NGS testing (xR+xT) requested on omentum specimen [id number] collected on 01/26/2024.   Financial assistance application completed and approved. Patient's out-of-pocket cost will not exceed $100.

## 2024-02-02 NOTE — Assessment & Plan Note (Signed)
 Status post celiac plexus block. Continue morphine 15 mg every 4 hours as needed.

## 2024-02-02 NOTE — Assessment & Plan Note (Signed)
 Status post right PCN.  She will need follow-up with IR for tube replacement in the future.

## 2024-02-02 NOTE — Progress Notes (Signed)
 Hematology/Oncology Consult note [phone] [phone]        REFERRING PROVIDER: Auston Reyes BIRCH, MD   CHIEF COMPLAINTS/REASON FOR VISIT:  Evaluation of pancreatic cancer   ASSESSMENT & PLAN:   Cancer Staging  Pancreatic cancer [facility]) Staging form: Exocrine Pancreas, AJCC 8th Edition - Clinical stage from 02/02/2024: Stage IV (cT3, cN1, pM1) - Signed by Samantha Call, MD on 02/02/2024   Pancreatic cancer [facility]) Metastatic pancreatic cancer with omental involvement, retroperitoneal lymphadenopathy, right hydronephrosis due to obstruction Status post right PCN placement, status post celiac plexus block. Baseline CA [id number]  The diagnosis and care plan were discussed with patient in detail.   Patient understands that her condition is not curable.  Treatment with palliative intent.  She desires palliative care chemotherapy.  Recommend gemcitabine +/- Abraxane The goal of treatment which is to palliate disease, disease related symptoms, improve quality of life and hopefully prolong life was highlighted in our discussion.  Chemotherapy education was provided.  We had discussed the composition of chemotherapy regimen, length of chemo cycle, duration of treatment and the time to assess response to treatment.    I explained to the patient the risks and benefits of chemotherapy including all but not limited to hair loss, mouth sore, nausea, vomiting, diarrhea, low blood counts, bleeding, neuropathy and risk of life threatening infection and even death, secondary malignancy etc.  . Patient voices understanding and willing to proceed chemotherapy.   # Chemotherapy education; option of Mediport placement was discussed with patient.  She declined.. Antiemetics-Zofran  and Compazine; EMLA cream sent to pharmacy # send NGS  Supportive care measures are necessary for patient well-being and will be provided as necessary. We spent sufficient time to discuss many aspect of care,  questions were answered to patient's satisfaction.   Hydronephrosis of right kidney Status post right PCN.  She will need follow-up with IR for tube replacement in the future.  Neoplasm related pain Status post celiac plexus block. Continue morphine 15 mg every 4 hours as needed.    Orders Placed This Encounter  Procedures   Consent Attestation for Oncology Treatment    The patient is informed of risks, benefits, side-effects of the prescribed oncology treatment. Potential short term and long term side effects and response rates discussed. After a long discussion, the patient made informed decision to proceed.:   Yes   CBC with Differential (Cancer Center Only)    Standing Status:   Future    Expected Date:   02/09/2024    Expiration Date:   02/08/2025   CMP (Cancer Center only)    Standing Status:   Future    Expected Date:   02/09/2024    Expiration Date:   02/08/2025   CBC with Differential (Cancer Center Only)    Standing Status:   Future    Expected Date:   02/16/2024    Expiration Date:   02/15/2025   CMP (Cancer Center only)    Standing Status:   Future    Expected Date:   02/16/2024    Expiration Date:   02/15/2025   CBC with Differential (Cancer Center Only)    Standing Status:   Future    Expected Date:   02/23/2024    Expiration Date:   02/22/2025   CMP (Cancer Center only)    Standing Status:   Future    Expected Date:   02/23/2024    Expiration Date:   02/22/2025   Ambulatory Referral to [facility]  Referral Priority:   Routine    Referral Type:   Consultation    Referral Reason:   Specialty Services Required    Number of Visits Requested:   1   Follow-up in the next 1 to 2 weeks to start treatments All questions were answered. The patient knows to Booker the clinic with any problems, questions or concerns.  Samantha Cap, MD, PhD [facility] Hematology Oncology 02/02/2024   HISTORY OF PRESENTING ILLNESS:   Samantha Booker is a  73 y.o.  female  presents for follow-up of thyroid  cancer management.  Oncology History  Pancreatic cancer (HCC)  01/18/2024 Initial Diagnosis   Pancreatic cancer   She has been experiencing abdominal pain and bloating for the past month, with the pain progressively worsening. The pain is severe and debilitating at times, with episodes that have left her bedridden, such as during a recent trip to the beach. The pain is often accompanied by a sensation of bloating and fullness. She has also noticed recent constipation, despite previously regular bowel movements.  Patient was hospitalized for intractable abdominal pain  01/26/24 PCN placement on right & CT Guided Biopsy of omental mass-pathology showed metastatic adenocarcinoma. The cells are strongly and diffusely positive for CK7, focally and weakly positive for CK20 and CDX2. The cells are negative for GATA3, PAX8, TTF-1. The overall findings are in keeping with an adenocarcinoma with possible site of primary being pancreaticobiliary system (pancreatic ductal adenocarcinoma, cholangiocarcinoma), upper gastrointestinal tract (stomach, esophagus), lung and head&neck region. Correlation with radiological findings is recommended  01/27/24 Celiac Plexus Block    02/02/2024 Cancer Staging   Staging form: Exocrine Pancreas, AJCC 8th Edition - Clinical stage from 02/02/2024: Stage IV (cT3, cN1, pM1) - Signed by Booker Zelphia, MD on 02/02/2024 Stage prefix: Initial diagnosis     Today patient presents for follow-up. Patient reports that upper abdominal pain has improved after celiac plexus block.  She still has lower abdominal discomfort. She is able to tolerate morphine during her hospitalization.  Has been prescribed with morphine 15 mg every 4 hours as needed.  She sometimes takes 1/day.  Appetite is fair.  Weight is stable.  Patient coming by husband.   MEDICAL HISTORY:  Past Medical History:  Diagnosis Date   Actinic keratosis 10/30/2021   R forearm LN2  05/15/2022   Actinic keratosis 05/15/2022   left upper arm, treated with LN2 05/20/2022   Atherosclerosis of aorta 06/25/2016   Imaging Feb 2018   DDD (degenerative disc disease), cervical    Foliclar lymph grade III, unsp, nodes of head, face, and nk (HCC)    Hepatitis 1972   Hep B   History of basal cell carcinoma (BCC) 10/30/2021   right popliteal fossa BCC NODULAR PATTERN, BASE INVOLVED  treated with ED&C   History of hepatitis B    History of kidney stones    in past   Hyperlipidemia    Hypothyroidism    Pneumonia    as child    SURGICAL HISTORY: Past Surgical History:  Procedure Laterality Date   ANTERIOR AND POSTERIOR REPAIR WITH SACROSPINOUS FIXATION N/A 01/12/2023   Procedure: ANTERIOR AND POSTERIOR REPAIR WITH SACROSPINOUS FIXATION;  Surgeon: Marilynne Rosaline SAILOR, MD;  Location: [facility];  Service: Gynecology;  Laterality: N/A;  Total time requested is 1.5 hrs   APPENDECTOMY  1979   BOTOX  INJECTION N/A 09/24/2021   Procedure: BOTOX  INJECTION;  Surgeon: Rodolph Romano, MD;  Location: [facility];  Service: General;  Laterality: N/A;  CATARACT EXTRACTION Bilateral 2019   CHOLECYSTECTOMY  2011   COLONOSCOPY WITH PROPOFOL  N/A 05/20/2018   Procedure: COLONOSCOPY WITH PROPOFOL ;  Surgeon: Unk Corinn Skiff, MD;  Location: [facility];  Service: Gastroenterology;  Laterality: N/A;   CYSTOSCOPY  01/12/2023   Procedure: CYSTOSCOPY;  Surgeon: Marilynne Rosaline SAILOR, MD;  Location: [facility];  Service: Gynecology;;   ESOPHAGOGASTRODUODENOSCOPY (EGD) WITH PROPOFOL  N/A 07/21/2019   Procedure: ESOPHAGOGASTRODUODENOSCOPY (EGD) WITH PROPOFOL ;  Surgeon: Unk Corinn Skiff, MD;  Location: [facility];  Service: Endoscopy;  Laterality: N/A;   EYE SURGERY Left 10/05/2017   HEMORRHOID SURGERY N/A 09/24/2021   Procedure: HEMORRHOIDECTOMY;  Surgeon: Rodolph Romano, MD;  Location: [facility];  Service: General;  Laterality: N/A;    INGUINAL HERNIA REPAIR Left 12/15/2018   Procedure: LAPAROSCOPIC LEFT INGUINAL HERNIA VS OPEN;  Surgeon: Rodolph Romano, MD;  Location: [facility];  Service: General;  Laterality: Left;   IR NEPHROSTOMY PLACEMENT RIGHT  01/26/2024   LAPAROSCOPIC UNILATERAL SALPINGO OOPHERECTOMY Right 12/15/2018   Procedure: LAPAROSCOPIC UNILATERAL OOPHORECTOMY;  Surgeon: Ward, Mitzie BROCKS, MD;  Location: [facility];  Service: Gynecology;  Laterality: Right;   PERINEOPLASTY N/A 01/12/2023   Procedure: PERINEOPLASTY;  Surgeon: Marilynne Rosaline SAILOR, MD;  Location: [facility];  Service: Gynecology;  Laterality: N/A;   ROOT CANAL     suburethral sling  2012   Dr. Barbaraann   VAGINAL HYSTERECTOMY  1980   precancerous cells    SOCIAL HISTORY: Social History   Socioeconomic History   Marital status: Married    Spouse name: Not on file   Number of children: 2   Years of education: Not on file   Highest education level: Not on file  Occupational History   Occupation: retired  Tobacco Use   Smoking status: Former    Current packs/day: 0.00    Average packs/day: 2.0 packs/day for 20.0 years (40.0 ttl pk-yrs)    Types: Cigarettes    Start date: 12/09/1971    Quit date: 12/09/1991    Years since quitting: 32.1   Smokeless tobacco: Never  Vaping Use   Vaping status: Never Used  Substance and Sexual Activity   Alcohol use: Yes    Alcohol/week: 2.0 standard drinks of alcohol    Types: 2 Glasses of wine per week    Comment: occ   Drug use: No   Sexual activity: Yes    Partners: Male  Other Topics Concern   Not on file  Social History Narrative   Lives in [place]. Married. 2 children, 1 living in [place], 1 in [place]   Social Drivers of Health   Financial Resource Strain: Low Risk  (01/18/2024)   Overall Financial Resource Strain (CARDIA)    Difficulty of Paying Living Expenses: Not very hard  Food Insecurity: No Food Insecurity (01/26/2024)   Hunger Vital Sign    Worried About  Running Out of Food in the Last Year: Never true    Ran Out of Food in the Last Year: Never true  Transportation Needs: No Transportation Needs (01/26/2024)   PRAPARE - Administrator, Civil Service (Medical): No    Lack of Transportation (Non-Medical): No  Physical Activity: Not on file  Stress: No Stress Concern Present (01/18/2024)   [employer] of Occupational Health - Occupational Stress Questionnaire    Feeling of Stress: Only a little  Social Connections: Unknown (01/26/2024)   Social Connection and Isolation Panel    Frequency of Communication with Friends and Family:  Patient declined    Frequency of Social Gatherings with Friends and Family: Patient declined    Attends Religious Services: Patient declined    Active Member of Clubs or Organizations: Patient declined    Attends Banker Meetings: Patient declined    Marital Status: Married  Catering manager Violence: Not At Risk (01/26/2024)   Humiliation, Afraid, Rape, and Kick questionnaire    Fear of Current or Ex-Partner: No    Emotionally Abused: No    Physically Abused: No    Sexually Abused: No    FAMILY HISTORY: Family History  Problem Relation Age of Onset   Diabetes Mother    Heart disease Mother        CABG 2007   Hyperlipidemia Mother    COPD Mother    Hyperlipidemia Father    Heart disease Father    Parkinson's disease Brother    Cancer Maternal Grandmother        breast   Cancer Maternal Aunt        breast   Cancer Cousin        brain   Cancer Cousin        melanoma   Cancer Daughter        breast cancer    ALLERGIES:  is allergic to penicillins.  MEDICATIONS:  Current Outpatient Medications  Medication Sig Dispense Refill   acetaminophen  (TYLENOL ) 500 MG tablet Take 1 tablet (500 mg total) by mouth every 6 (six) hours as needed (pain). 30 tablet 0   atorvastatin  (LIPITOR) 40 MG tablet Take 1 tablet (40 mg total) by mouth at bedtime. 90 tablet 1   levothyroxine   (SYNTHROID ) 100 MCG tablet Take 100 mcg by mouth daily before breakfast.     mirtazapine (REMERON) 15 MG tablet Take 1 tablet (15 mg total) by mouth at bedtime. 30 tablet 1   morphine (MSIR) 15 MG tablet Take 1 tablet (15 mg total) by mouth every 4 (four) hours as needed for severe pain (pain score 7-10) or moderate pain (pain score 4-6). 15 tablet 0   pregabalin (LYRICA) 75 MG capsule Take 75 mg by mouth 2 (two) times daily.     prochlorperazine (COMPAZINE) 5 MG tablet Take 1 tablet (5 mg total) by mouth every 6 (six) hours as needed for refractory nausea / vomiting. 30 tablet 0   venlafaxine XR (EFFEXOR-XR) 75 MG 24 hr capsule Take by mouth.     acyclovir cream (ZOVIRAX) 5 % Apply 1 application topically every 4 (four) hours as needed (for cold sores).  (Patient not taking: Reported on 02/02/2024)     aspirin EC 81 MG tablet Take 1 tablet (81 mg total) by mouth daily. (Patient not taking: Reported on 02/02/2024)     BIOTIN PO Take 1 tablet by mouth daily. (Patient not taking: Reported on 02/02/2024)     cholecalciferol (VITAMIN D3) 25 MCG (1000 UT) tablet Take 1,000 Units by mouth daily. (Patient not taking: Reported on 02/02/2024)     estradiol  (ESTRACE ) 0.1 MG/GM vaginal cream Place 0.5g nightly for two weeks then twice a week after (Patient not taking: Reported on 02/02/2024) 30 g 11   hydrocortisone  (ANUSOL -HC) 2.5 % rectal cream Place 1 application rectally 2 (two) times daily. (Patient not taking: Reported on 02/02/2024) 30 g 1   PREMARIN  1.25 MG tablet Take 1.25 mg by mouth daily. Takes 1/4 of 1.25 mg daily (Patient not taking: Reported on 02/02/2024)     No current facility-administered medications for this visit.  Review of Systems  Constitutional:  Negative for appetite change, chills, fatigue and fever.  HENT:   Negative for hearing loss and voice change.   Eyes:  Negative for eye problems.  Respiratory:  Negative for chest tightness and cough.   Cardiovascular:  Negative for chest  pain.  Gastrointestinal:  Negative for abdominal distention, blood in stool, nausea and vomiting.       Abdominal bloating and discomfort.  Endocrine: Negative for hot flashes.  Genitourinary:  Negative for difficulty urinating and frequency.   Musculoskeletal:  Negative for arthralgias.  Skin:  Negative for itching and rash.  Neurological:  Negative for extremity weakness.  Hematological:  Negative for adenopathy.  Psychiatric/Behavioral:  Negative for confusion.    PHYSICAL EXAMINATION:  Vitals:   02/02/24 1050  BP: (!) 143/90  Pulse: 90  Resp: 20  Temp: 98.6 F (37 C)  SpO2: 100%   Filed Weights   02/02/24 1050  Weight: 118 lb 8 oz (53.8 kg)    Physical Exam Constitutional:      General: She is not in acute distress. HENT:     Head: Normocephalic and atraumatic.  Eyes:     General: No scleral icterus. Cardiovascular:     Rate and Rhythm: Normal rate and regular rhythm.     Heart sounds: Normal heart sounds.  Pulmonary:     Effort: Pulmonary effort is normal. No respiratory distress.     Breath sounds: Normal breath sounds.  Abdominal:     General: Bowel sounds are normal. There is distension.     Palpations: Abdomen is soft.  Musculoskeletal:        General: No deformity. Normal range of motion.     Cervical back: Normal range of motion and neck supple.  Skin:    General: Skin is warm and dry.     Findings: No erythema or rash.  Neurological:     Mental Status: She is alert and oriented to person, place, and time. Mental status is at baseline.  Psychiatric:        Mood and Affect: Mood normal.     LABORATORY DATA:  I have reviewed the data as listed    Latest Ref Rng & Units 01/28/2024    6:11 AM 01/27/2024    3:41 AM 01/25/2024    5:18 PM  CBC  WBC 4.0 - 10.5 K/uL  10.1  10.0   Hemoglobin 12.0 - 15.0 g/dL 86.1  87.7  87.4   Hematocrit 36.0 - 46.0 %  36.1  37.7   Platelets 150 - 400 K/uL  221  242       Latest Ref Rng & Units 01/29/2024    4:07 AM  01/28/2024    6:11 AM 01/27/2024    3:41 AM  CMP  Glucose 70 - 99 mg/dL 866  890  894   BUN 8 - 23 mg/dL 16  13  8    Creatinine 0.44 - 1.00 mg/dL 9.49  9.46  9.55   Sodium 135 - 145 mmol/L 137  137  138   Potassium 3.5 - 5.1 mmol/L 4.4  3.9  3.2   Chloride 98 - 111 mmol/L 107  105  105   CO2 22 - 32 mmol/L 19  18  23    Calcium  8.9 - 10.3 mg/dL 8.7  8.5  8.3       RADIOGRAPHIC STUDIES: I have personally reviewed the radiological images as listed and agreed with the findings in the report. DG Chest 1  View Result Date: 01/28/2024 CLINICAL DATA:  Shortness of breath. EXAM: CHEST  1 VIEW COMPARISON:  None Available. FINDINGS: The heart size and mediastinal contours are within normal limits. Both lungs are clear. The visualized skeletal structures are unremarkable. IMPRESSION: No active disease. Electronically Signed   By: Lynwood Landy Raddle M.D.   On: 01/28/2024 16:33   CT CELIAC PLEXUS BLOCK NEUROLYTIC Result Date: 01/27/2024 CLINICAL DATA:  , Severe abdominal pain metastatic pancreatic carcinoma EXAM: CT GUIDED NEUROLYTIC ABLATION OF THE CELIAC AXIS ANESTHESIA/SEDATION: Intravenous Fentanyl  100mcg and Versed  1mg  were administered by RN during a total moderate (conscious) sedation time of 43 minutes; the patient's level of consciousness and physiological / cardiorespiratory status were monitored continuously by radiology RN under my direct supervision. PROCEDURE: The procedure risks, benefits, and alternatives were explained to the patient. Questions regarding the procedure were encouraged and answered. The patient understands and consents to the procedure. The anterior abdominal wall was prepped with chlorhexidine  in a sterile fashion, and a sterile drape was applied covering the operative field. A sterile gown and sterile gloves were used for the procedure. Local anesthesia was provided with 1% Lidocaine . A 22 gauge Chiba needle was advanced under CT guidance to the level of the celiac plexus to the  left of midline. After confirming needle tip position, approximately three ml of a 1:10 dilution of Omnipaque -300 contrast and saline was injected. Unilateral retroperitoneal spread of diluted contrast material was confirmed by CT. A 22 gauge Chiba needle was advanced under CT guidance to the level of the celiac plexus to the right of midline. After confirming needle tip position, approximately three ml of a 1:10 dilution of Omnipaque -300 contrast and saline was injected. Spread of diluted contrast material was confirmed by CT. 20 mL lidocaine  1% was then administered divided evenly between the 2 sites. Patient observed for 10 minutes. Hemodynamic stability was confirmed. Subsequently, alcohol ablation of the celiac plexus was then performed with injection of 30 ml of absolute alcohol. COMPLICATIONS: None FINDINGS: Contrast injection showed spread bilaterally at the level of the celiac plexus. Celiac plexus block was successfully performed without complication. Alcohol neurolysis was performed without complication. IMPRESSION: CT guided block and neurolytic ablation of the celiac plexus as above. Electronically Signed   By: JONETTA Faes M.D.   On: 01/27/2024 16:19   IR NEPHROSTOMY PLACEMENT RIGHT Result Date: 01/26/2024 INDICATION: Right-sided hydronephrosis in a patient with metastatic pancreatic cancer. Increasing abdominal pain which may be related to urinary obstruction. EXAM: Nephrostomy tube placement COMPARISON:  None Available. ANESTHESIA/SEDATION: Moderate (conscious) sedation was employed during this procedure. A total of Versed  0.5 mg and Fentanyl  25 mcg was administered intravenously by the radiology nurse. Total intra-service moderate Sedation Time: 10 minutes. The patient's level of consciousness and vital signs were monitored continuously by radiology nursing throughout the procedure under my direct supervision. CONTRAST:  10 mL Omnipaque  300-administered into the collecting system(s) FLUOROSCOPY:  Radiation Exposure Index (as provided by the fluoroscopic device): 5 mGy Kerma COMPLICATIONS: None immediate. PROCEDURE: Informed written consent was obtained from the patient after a thorough discussion of the procedural risks, benefits and alternatives. All questions were addressed. Maximal Sterile Barrier Technique was utilized including caps, mask, sterile gowns, sterile gloves, sterile drape, hand hygiene and skin antiseptic. A timeout was performed prior to the initiation of the procedure. In a prone position on the IR table, the right flank region was prepped and draped in usual sterile fashion. Local anesthesia was achieved by infiltrating subcutaneous tissue to the level  of the renal capsule with 1 percent lidocaine  under ultrasound guidance. A small incision was then made in the right flank region. Through the incision an Accustick needle was advanced with ultrasound guidance from the skin to the renal capsule and into a lower pole calyx. Calyx and renal pelvis are moderately dilated on ultrasound corresponding with findings from the recent CT of the abdomen and pelvis. The stylet was removed and clear urine was obtained at the hub of the needle. Small volume of contrast was injected demonstrating needle position within the renal collecting system. An 018 guidewire was then advanced under fluoroscopy and coiled in the renal pelvis. Access was then exchanged over the guidewire for an Accustick introducer set. The stiffener and introducer were then removed as well as the guidewire and a short Amplatz wire was advanced under fluoroscopic guidance. The sheath was then removed and the access site was dilated using a 10 [place] dilator. A 10 French percutaneous nephrostomy tube was then advanced over the guidewire and into position within the renal pelvis as the stiffener and guidewire were retrieved. Retention suture and sterile dressing applied. Catheter connected to gravity drainage. IMPRESSION: Satisfactory  placement of a right-sided percutaneous nephrostomy tube (10 [place]). The patient will return in approximately 8 weeks for routine exchange. Electronically Signed   By: Cordella Banner   On: 01/26/2024 14:31   CT ABDOMINAL MASS BIOPSY Result Date: 01/26/2024 INDICATION: The patient has multiple mesenteric lymph nodes, pancreatic tail mass, and significant bilateral omental thickening. EXAM: CT-guided biopsy TECHNIQUE: Multidetector CT imaging of the abdomen was performed following the standard protocol without IV contrast. RADIATION DOSE REDUCTION: This exam was performed according to the departmental dose-optimization program which includes automated exposure control, adjustment of the mA and/or kV according to patient size and/or use of iterative reconstruction technique. MEDICATIONS: None. ANESTHESIA/SEDATION: Moderate (conscious) sedation was employed during this procedure. A total of Versed  1 mg and Fentanyl  50 mcg was administered intravenously by the radiology nurse. Total intra-service moderate Sedation Time: 15 minutes. The patient's level of consciousness and vital signs were monitored continuously by radiology nursing throughout the procedure under my direct supervision. COMPLICATIONS: None immediate. PROCEDURE: Informed written consent was obtained from the patient after a thorough discussion of the procedural risks, benefits and alternatives. All questions were addressed. Maximal Sterile Barrier Technique was utilized including caps, mask, sterile gowns, sterile gloves, sterile drape, hand hygiene and skin antiseptic. A timeout was performed prior to the initiation of the procedure. In a supine position with radiopaque markers on the abdomen, initial axial images of the abdomen were obtained. Images were compared to the previous study of the abdomen and measurements were then obtained using the expected target region. The patient's skin was then prepped and draped in usual sterile fashion. Local  anesthesia was achieved by infiltrating subcutaneous tissue to the rectus muscle with 1% lidocaine . A 17 gauge trocar was then advanced from the skin incision through the abdomen rectus oriented towards the omental thickening on the left side. Two 18 gauge samples were then obtained using the BioPince needle. Further imaging was obtained and the trocar was repositioned and a third and final 18 gauge sample was obtained. Samples placed in formalin and sent to pathology. All needles withdrawn from the patient and sterile dressing applied. IMPRESSION: Satisfactory CT-guided omental mass biopsy as described above. Electronically Signed   By: Cordella Banner   On: 01/26/2024 13:56   CT CHEST ABDOMEN PELVIS W CONTRAST Result Date: 01/25/2024 CLINICAL DATA:  Pancreatic  mass, abdominal pain and bloating * Tracking Code: BO * EXAM: CT CHEST, ABDOMEN, AND PELVIS WITH CONTRAST TECHNIQUE: Multidetector CT imaging of the chest, abdomen and pelvis was performed following the standard protocol during bolus administration of intravenous contrast. RADIATION DOSE REDUCTION: This exam was performed according to the departmental dose-optimization program which includes automated exposure control, adjustment of the mA and/or kV according to patient size and/or use of iterative reconstruction technique. CONTRAST:  60mL OMNIPAQUE  IOHEXOL  300 MG/ML SOLN additional oral enteric contrast COMPARISON:  CT abdomen pelvis 01/12/2024 FINDINGS: CT CHEST FINDINGS Cardiovascular: Aortic atherosclerosis. Normal heart size. Three-vessel coronary artery calcifications. No pericardial effusion. Mediastinum/Nodes: No enlarged mediastinal, hilar, or axillary lymph nodes. Trachea and esophagus demonstrate no significant findings. Lungs/Pleura: Occasional small bilateral pulmonary nodules, measuring up to 0.3 cm in the peripheral superior segment right lower lobe (series 3, image 71). Background of extensive fine centrilobular nodularity throughout the  lungs. Bandlike scarring of the bilateral lung bases. No pleural effusion or pneumothorax. Musculoskeletal: No chest wall abnormality. No acute osseous findings. CT ABDOMEN PELVIS FINDINGS Hepatobiliary: No solid liver abnormality is seen. Hepatic steatosis. Cholecystectomy. No biliary ductal dilatation. Pancreas: Hypoenhancing mass centered in the pancreatic body and proximal tail measuring 5.0 x 3.8 cm (series 2, image 69). No pancreatic ductal dilatation or surrounding inflammatory changes. Spleen: Normal in size without significant abnormality. Adrenals/Urinary Tract: Adrenal glands are unremarkable. Severe right hydronephrosis and hydroureter, the distal ureter presumably obstructed by metastatic disease, although nidus of obstruction not directly visualized. No calculi. No left-sided hydronephrosis. Small nonobstructive bilateral renal calculi. Bladder is unremarkable. Stomach/Bowel: Stomach is within normal limits. Appendix appears normal. No evidence of bowel wall thickening, distention, or inflammatory changes. Vascular/Lymphatic: Aortic atherosclerosis. Matted lymphadenopathy or soft tissue mass in the gastrohepatic ligament closely abutting the lesser curvature of the stomach, measuring 3.6 x 3.4 cm (series 2, image 66). Enlarged retroperitoneal lymph nodes measuring up to 1.5 x 1.3 cm (series 2, image 62). Reproductive: No mass or other abnormality. Other: No abdominal wall hernia or abnormality. Small volume ascites. Diffuse peritoneal nodularity with dense omental caking in the ventral abdomen. Musculoskeletal: No acute osseous findings. IMPRESSION: 1. Hypoenhancing mass centered in the pancreatic body and proximal tail measuring 5.0 x 3.8 cm, consistent with primary pancreatic adenocarcinoma. 2. Matted lymphadenopathy or soft tissue mass in the gastrohepatic ligament closely abutting the lesser curvature of the stomach, measuring 3.6 x 3.4 cm. Enlarged retroperitoneal lymph nodes measuring up to 1.5 x  1.3 cm. 3. Diffuse peritoneal nodularity with dense omental caking in the ventral abdomen, consistent with peritoneal carcinomatosis. 4. Severe right hydronephrosis and hydroureter, the distal ureter presumably obstructed by metastatic disease, although nidus of obstruction not directly visualized. 5. Occasional small bilateral pulmonary nodules, measuring up to 0.3 cm in the peripheral superior segment right lower lobe, nonspecific although suspicious for small pulmonary metastases. Attention on follow-up. 6. Hepatic steatosis. 7. Coronary artery disease. Aortic Atherosclerosis (ICD10-I70.0). Electronically Signed   By: Marolyn JONETTA Jaksch M.D.   On: 01/25/2024 13:35   CT ABDOMEN PELVIS WO CONTRAST Result Date: 01/12/2024 CLINICAL DATA:  Abdominal pain EXAM: CT ABDOMEN AND PELVIS WITHOUT CONTRAST TECHNIQUE: Multidetector CT imaging of the abdomen and pelvis was performed following the standard protocol without IV contrast. RADIATION DOSE REDUCTION: This exam was performed according to the departmental dose-optimization program which includes automated exposure control, adjustment of the mA and/or kV according to patient size and/or use of iterative reconstruction technique. COMPARISON:  10/22/2018 FINDINGS: Lower chest: No acute abnormality Hepatobiliary: No  focal liver abnormality is seen. Status post cholecystectomy. No biliary dilatation. Pancreas: Pancreatic body mass suspected measuring 4.1 x 2.8 cm with truncation/atrophy of the pancreatic tail beyond the mass. This is a new appearance when compared to prior study. No ductal dilatation. No surrounding inflammation. Spleen: No focal abnormality.  Normal size. Adrenals/Urinary Tract: Moderate right hydronephrosis and hydroureter. No visible obstructing stones. Area of cortical thinning/scarring in the mid to lower pole of the right kidney with parenchymal calcification, stable since prior study. Punctate nonobstructing left lower pole stone. No hydronephrosis on  the left. Adrenal glands and urinary bladder unremarkable. Stomach/Bowel: Stomach, large and small bowel grossly unremarkable. Vascular/Lymphatic: Heavily calcified aorta and iliac vessels. No evidence of aneurysm or adenopathy. Reproductive: Prior hysterectomy.  No adnexal masses. Other: Free fluid in the pelvis. No free air. There are peritoneal nodules noted in the anterior and left abdomen. Omental thickening/nodularity noted. Musculoskeletal: No acute or suspicious focal osseous abnormality. Scoliosis and degenerative changes in the lumbar spine. IMPRESSION: Masslike area in the pancreatic body measuring 4.1 x 2.8 cm with atrophy of the pancreatic tail. Findings concerning for pancreatic mass/neoplasm. Recommend further evaluation with MRI. 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 Small to mild free fluid in the pelvis. Right hydronephrosis and hydroureter without visible obstructing stone. Left nephrolithiasis. Aortoiliac atherosclerosis. Electronically Signed   By: Franky Crease M.D.   On: 01/12/2024 13:34

## 2024-02-02 NOTE — Progress Notes (Signed)
START ON PATHWAY REGIMEN - Pancreatic Adenocarcinoma ° ° °  A cycle is every 28 days: °    Nab-paclitaxel (protein bound)  °    Gemcitabine  ° °**Always confirm dose/schedule in your pharmacy ordering system** ° °Patient Characteristics: °Metastatic Disease, First Line, PS  ?  2, BRCA1/2 and PALB2 Mutation Absent/Unknown °Therapeutic Status: Metastatic Disease °Line of Therapy: First Line °ECOG Performance Status: 2 °BRCA1/2 Mutation Status: Awaiting Test Results °PALB2 Mutation Status: Awaiting Test Results °Intent of Therapy: °Non-Curative / Palliative Intent, Discussed with Patient °

## 2024-02-03 ENCOUNTER — Encounter: Payer: Self-pay | Admitting: Oncology

## 2024-02-03 NOTE — Progress Notes (Signed)
 Pharmacist Chemotherapy Monitoring - Initial Assessment    Anticipated start date: 02/09/24   The following has been reviewed per standard work regarding the patient's treatment regimen: The patient's diagnosis, treatment plan and drug doses, and organ/hematologic function Lab orders and baseline tests specific to treatment regimen  The treatment plan start date, drug sequencing, and pre-medications Prior authorization status  Patient's documented medication list, including drug-drug interaction screen and prescriptions for anti-emetics and supportive care specific to the treatment regimen The drug concentrations, fluid compatibility, administration routes, and timing of the medications to be used The patient's access for treatment and lifetime cumulative dose history, if applicable  The patient's medication allergies and previous infusion related reactions, if applicable  Metastatic pancreatic cancer with omental involvement Treatment with palliative intent  Recommend gemcitabine / Abraxane  Changes made to treatment plan:  none  Follow up needed:  N/A   Redell JINNY Gaskins, [facility], 02/03/2024  12:34 PM

## 2024-02-04 ENCOUNTER — Telehealth: Payer: Self-pay | Admitting: Physician Assistant

## 2024-02-04 NOTE — Telephone Encounter (Signed)
 I believe she is referring to her nephrostomy tube; she does not have a ureteral stent.  I do not know if we will ever be able to take out her nephrostomy tube (we spoke about this in the hospital).  In order to take out the tube, she would have to have significant improvement on chemotherapy that will allow her ureter to drain to her bladder.  If we take the nephrostomy tube out while she still has obstructing tumors in her abdomen/pelvis, then she will develop recurrent urinary blockage, kidney damage, and severe pain.  Right now, her office visit with me in January is just an outpatient follow-up.  I want her to keep plans for routine nephrostomy tube exchanges with IR and chemotherapy with Dr. Babara.  I will ask Dr. Babara to keep me updated on her progress.  If she thinks there is an opportunity to remove it, we can revisit the plan.

## 2024-02-04 NOTE — Telephone Encounter (Signed)
 Pt's appt was r/s from 12/2 to 1/5, as it should according to hospital discharge, she should have a 3 month follow up (from 10/2)  pt said she thought we were removing stent.  She asked if we could move appt up to before Christmas.  She said this may be her last Chirstmas and she wanted to feel as good as possible.

## 2024-02-08 ENCOUNTER — Inpatient Hospital Stay

## 2024-02-08 DIAGNOSIS — M5030 Other cervical disc degeneration, unspecified cervical region: Secondary | ICD-10-CM | POA: Diagnosis not present

## 2024-02-08 DIAGNOSIS — C259 Malignant neoplasm of pancreas, unspecified: Secondary | ICD-10-CM | POA: Diagnosis not present

## 2024-02-08 DIAGNOSIS — I700 Atherosclerosis of aorta: Secondary | ICD-10-CM | POA: Diagnosis not present

## 2024-02-08 DIAGNOSIS — E785 Hyperlipidemia, unspecified: Secondary | ICD-10-CM | POA: Diagnosis not present

## 2024-02-08 DIAGNOSIS — C251 Malignant neoplasm of body of pancreas: Secondary | ICD-10-CM

## 2024-02-08 DIAGNOSIS — C786 Secondary malignant neoplasm of retroperitoneum and peritoneum: Secondary | ICD-10-CM | POA: Diagnosis not present

## 2024-02-08 DIAGNOSIS — M8580 Other specified disorders of bone density and structure, unspecified site: Secondary | ICD-10-CM | POA: Diagnosis not present

## 2024-02-08 DIAGNOSIS — E039 Hypothyroidism, unspecified: Secondary | ICD-10-CM | POA: Diagnosis not present

## 2024-02-08 NOTE — Progress Notes (Signed)
 [facility] CSW Progress Note  Clinical Social Work introduced self to patient during Patient Education with Raoul Moats, Charity fundraiser.  Provided information regarding CSW role, including counseling, advanced care planning and support group.  Answered questions as needed.  Follow Up Plan:  CSW will follow-up with patient by phone     Macario CHRISTELLA Au, LCSW Clinical Social Worker [facility]

## 2024-02-09 ENCOUNTER — Inpatient Hospital Stay

## 2024-02-09 ENCOUNTER — Encounter: Payer: Self-pay | Admitting: Oncology

## 2024-02-09 ENCOUNTER — Inpatient Hospital Stay (HOSPITAL_BASED_OUTPATIENT_CLINIC_OR_DEPARTMENT_OTHER): Admitting: Oncology

## 2024-02-09 ENCOUNTER — Telehealth: Payer: Self-pay

## 2024-02-09 VITALS — BP 129/77 | HR 88

## 2024-02-09 VITALS — BP 143/88 | HR 90 | Temp 97.6°F | Resp 16 | Ht 62.0 in | Wt 117.0 lb

## 2024-02-09 DIAGNOSIS — Z5111 Encounter for antineoplastic chemotherapy: Secondary | ICD-10-CM | POA: Diagnosis not present

## 2024-02-09 DIAGNOSIS — C251 Malignant neoplasm of body of pancreas: Secondary | ICD-10-CM | POA: Diagnosis not present

## 2024-02-09 DIAGNOSIS — N1330 Unspecified hydronephrosis: Secondary | ICD-10-CM

## 2024-02-09 DIAGNOSIS — G893 Neoplasm related pain (acute) (chronic): Secondary | ICD-10-CM

## 2024-02-09 LAB — CMP (CANCER CENTER ONLY)
ALT: 22 U/L (ref 0–44)
AST: 29 U/L (ref 15–41)
Albumin: 3.4 g/dL — ABNORMAL LOW (ref 3.5–5.0)
Alkaline Phosphatase: 94 U/L (ref 38–126)
Anion gap: 13 (ref 5–15)
BUN: 11 mg/dL (ref 8–23)
CO2: 22 mmol/L (ref 22–32)
Calcium: 9.1 mg/dL (ref 8.9–10.3)
Chloride: 100 mmol/L (ref 98–111)
Creatinine: 0.63 mg/dL (ref 0.44–1.00)
GFR, Estimated: >60 mL/min (ref >60)
Glucose, Bld: 132 mg/dL — ABNORMAL HIGH (ref 70–99)
Potassium: 3.6 mmol/L (ref 3.5–5.1)
Sodium: 135 mmol/L (ref 135–145)
Total Bilirubin: 0.6 mg/dL (ref 0.0–1.2)
Total Protein: 7.1 g/dL (ref 6.5–8.1)

## 2024-02-09 LAB — CBC WITH DIFFERENTIAL (CANCER CENTER ONLY)
Abs Immature Granulocytes: 0.11 K/uL — ABNORMAL HIGH (ref 0.00–0.07)
Basophils Absolute: 0.1 K/uL (ref 0.0–0.1)
Basophils Relative: 1 %
Eosinophils Absolute: 0.3 K/uL (ref 0.0–0.5)
Eosinophils Relative: 2 %
HCT: 35.1 % — ABNORMAL LOW (ref 36.0–46.0)
Hemoglobin: 11.7 g/dL — ABNORMAL LOW (ref 12.0–15.0)
Immature Granulocytes: 1 %
Lymphocytes Relative: 13 %
Lymphs Abs: 1.7 K/uL (ref 0.7–4.0)
MCH: 30.2 pg (ref 26.0–34.0)
MCHC: 33.3 g/dL (ref 30.0–36.0)
MCV: 90.5 fL (ref 80.0–100.0)
Monocytes Absolute: 1.5 K/uL — ABNORMAL HIGH (ref 0.1–1.0)
Monocytes Relative: 11 %
Neutro Abs: 9.9 K/uL — ABNORMAL HIGH (ref 1.7–7.7)
Neutrophils Relative %: 72 %
Platelet Count: 310 K/uL (ref 150–400)
RBC: 3.88 MIL/uL (ref 3.87–5.11)
RDW: 12.4 % (ref 11.5–15.5)
WBC Count: 13.6 K/uL — ABNORMAL HIGH (ref 4.0–10.5)
nRBC: 0 % (ref 0.0–0.2)

## 2024-02-09 MED ORDER — SODIUM CHLORIDE 0.9 % IV SOLN
800.0000 mg/m2 | Freq: Once | INTRAVENOUS | Status: AC
  Administered 2024-02-09: 1216 mg via INTRAVENOUS
  Filled 2024-02-09: qty 31.98

## 2024-02-09 MED ORDER — PROCHLORPERAZINE MALEATE 10 MG PO TABS
10.0000 mg | ORAL_TABLET | Freq: Once | ORAL | Status: AC
  Administered 2024-02-09: 10 mg via ORAL
  Filled 2024-02-09: qty 1

## 2024-02-09 MED ORDER — PACLITAXEL PROTEIN-BOUND CHEMO INJECTION 100 MG
100.0000 mg/m2 | Freq: Once | INTRAVENOUS | Status: AC
  Administered 2024-02-09: 150 mg via INTRAVENOUS
  Filled 2024-02-09: qty 30

## 2024-02-09 MED ORDER — SODIUM CHLORIDE 0.9 % IV SOLN
INTRAVENOUS | Status: DC
  Filled 2024-02-09: qty 250

## 2024-02-09 NOTE — Assessment & Plan Note (Signed)
 Treatment plan as listed above.

## 2024-02-09 NOTE — Patient Instructions (Signed)
 [facility] - A DEPT OF [facility]  Discharge Instructions: Thank you for choosing [facility] to provide your oncology and hematology care.  If you have a lab appointment with the Cancer Center, please go directly to the Cancer Center and check in at the registration area.  Wear comfortable clothing and clothing appropriate for easy access to any Portacath or PICC line.   We strive to give you quality time with your provider. You may need to reschedule your appointment if you arrive late (15 or more minutes).  Arriving late affects you and other patients whose appointments are after yours.  Also, if you miss three or more appointments without notifying the office, you may be dismissed from the clinic at the provider's discretion.      For prescription refill requests, have your pharmacy contact our office and allow 72 hours for refills to be completed.    Today you received the following chemotherapy and/or immunotherapy agents Gemzar      To help prevent nausea and vomiting after your treatment, we encourage you to take your nausea medication as directed.  BELOW ARE SYMPTOMS THAT SHOULD BE REPORTED IMMEDIATELY: *FEVER GREATER THAN 100.4 F (38 C) OR HIGHER *CHILLS OR SWEATING *NAUSEA AND VOMITING THAT IS NOT CONTROLLED WITH YOUR NAUSEA MEDICATION *UNUSUAL SHORTNESS OF BREATH *UNUSUAL BRUISING OR BLEEDING *URINARY PROBLEMS (pain or burning when urinating, or frequent urination) *BOWEL PROBLEMS (unusual diarrhea, constipation, pain near the anus) TENDERNESS IN MOUTH AND THROAT WITH OR WITHOUT PRESENCE OF ULCERS (sore throat, sores in mouth, or a toothache) UNUSUAL RASH, SWELLING OR PAIN  UNUSUAL VAGINAL DISCHARGE OR ITCHING   Items with * indicate a potential emergency and should be followed up as soon as possible or go to the Emergency Department if any problems should occur.  Please show the CHEMOTHERAPY ALERT CARD or IMMUNOTHERAPY ALERT  CARD at check-in to the Emergency Department and triage nurse.  Should you have questions after your visit or need to cancel or reschedule your appointment, please contact [facility] - A DEPT OF [facility]  [phone] and follow the prompts.  Office hours are 8:00 a.m. to 4:30 p.m. Monday - Friday. Please note that voicemails left after 4:00 p.m. may not be returned until the following business day.  We are closed weekends and major holidays. You have access to a nurse at all times for urgent questions. Please call the main number to the clinic [phone] and follow the prompts.  For any non-urgent questions, you may also contact your provider using MyChart. We now offer e-Visits for anyone 71 and older to request care online for non-urgent symptoms. For details visit [email or web address].   Also download the MyChart app! Go to the app store, search MyChart, open the app, select [facility], and log in with your MyChart username and password.    Gemcitabine Injection What is this medication? GEMCITABINE (jem SYE ta been) treats some types of cancer. It works by slowing down the growth of cancer cells. This medicine may be used for other purposes; ask your health care provider or pharmacist if you have questions. COMMON BRAND NAME(S): Gemzar, Infugem What should I tell my care team before I take this medication? They need to know if you have any of these conditions: Blood disorders Infection Kidney disease Liver disease Lung or breathing disease, such as asthma or COPD Recent or ongoing radiation therapy An unusual  or allergic reaction to gemcitabine, other medications, foods, dyes, or preservatives If you or your partner are pregnant or trying to get pregnant Breast-feeding How should I use this medication? This medication is injected into a vein. It is given by your care team in a hospital or clinic setting. Talk to your care team about the  use of this medication in children. Special care may be needed. Overdosage: If you think you have taken too much of this medicine contact a poison control center or emergency room at once. NOTE: This medicine is only for you. Do not share this medicine with others. What if I miss a dose? Keep appointments for follow-up doses. It is important not to miss your dose. Call your care team if you are unable to keep an appointment. What may interact with this medication? Interactions have not been studied. This list may not describe all possible interactions. Give your health care provider a list of all the medicines, herbs, non-prescription drugs, or dietary supplements you use. Also tell them if you smoke, drink alcohol, or use illegal drugs. Some items may interact with your medicine. What should I watch for while using this medication? Your condition will be monitored carefully while you are receiving this medication. This medication may make you feel generally unwell. This is not uncommon, as chemotherapy can affect healthy cells as well as cancer cells. Report any side effects. Continue your course of treatment even though you feel ill unless your care team tells you to stop. In some cases, you may be given additional medications to help with side effects. Follow all directions for their use. This medication may increase your risk of getting an infection. Call your care team for advice if you get a fever, chills, sore throat, or other symptoms of a cold or flu. Do not treat yourself. Try to avoid being around people who are sick. This medication may increase your risk to bruise or bleed. Call your care team if you notice any unusual bleeding. Be careful brushing or flossing your teeth or using a toothpick because you may get an infection or bleed more easily. If you have any dental work done, tell your dentist you are receiving this medication. Avoid taking medications that contain aspirin,  acetaminophen , ibuprofen , naproxen, or ketoprofen unless instructed by your care team. These medications may hide a fever. Talk to your care team if you or your partner wish to become pregnant or think you might be pregnant. This medication can cause serious birth defects if taken during pregnancy and for 6 months after the last dose. A negative pregnancy test is required before starting this medication. A reliable form of contraception is recommended while taking this medication and for 6 months after the last dose. Talk to your care team about effective forms of contraception. Do not father a child while taking this medication and for 3 months after the last dose. Use a condom while having sex during this time period. Do not breastfeed while taking this medication and for at least 1 week after the last dose. This medication may cause infertility. Talk to your care team if you are concerned about your fertility. What side effects may I notice from receiving this medication? Side effects that you should report to your care team as soon as possible: Allergic reactions--skin rash, itching, hives, swelling of the face, lips, tongue, or throat Capillary leak syndrome--stomach or muscle pain, unusual weakness or fatigue, feeling faint or lightheaded, decrease in the amount of urine,  swelling of the ankles, hands, or feet, trouble breathing Infection--fever, chills, cough, sore throat, wounds that don't heal, pain or trouble when passing urine, general feeling of discomfort or being unwell Liver injury--right upper belly pain, loss of appetite, nausea, light-colored stool, dark yellow or brown urine, yellowing skin or eyes, unusual weakness or fatigue Low red blood cell level--unusual weakness or fatigue, dizziness, headache, trouble breathing Lung injury--shortness of breath or trouble breathing, cough, spitting up blood, chest pain, fever Stomach pain, bloody diarrhea, pale skin, unusual weakness or fatigue,  decrease in the amount of urine, which may be signs of hemolytic uremic syndrome Sudden and severe headache, confusion, change in vision, seizures, which may be signs of posterior reversible encephalopathy syndrome (PRES) Unusual bruising or bleeding Side effects that usually do not require medical attention (report to your care team if they continue or are bothersome): Diarrhea Drowsiness Hair loss Nausea Pain, redness, or swelling with sores inside the mouth or throat Vomiting This list may not describe all possible side effects. Call your doctor for medical advice about side effects. You may report side effects to FDA at 1-800-FDA-1088. Where should I keep my medication? This medication is given in a hospital or clinic. It will not be stored at home. NOTE: This sheet is a summary. It may not cover all possible information. If you have questions about this medicine, talk to your doctor, pharmacist, or health care provider.  2024 Elsevier/Gold Standard (2021-08-20 00:00:00)    Paclitaxel Nanoparticle Albumin-Bound Injection What is this medication? NANOPARTICLE ALBUMIN-BOUND PACLITAXEL (Na no PAHR ti kuhl al BYOO muhn-bound PAK li TAX el) treats some types of cancer. It works by slowing down the growth of cancer cells. This medicine may be used for other purposes; ask your health care provider or pharmacist if you have questions. COMMON BRAND NAME(S): Abraxane What should I tell my care team before I take this medication? They need to know if you have any of these conditions: Liver disease Low white blood cell levels An unusual or allergic reaction to paclitaxel, albumin, other medications, foods, dyes, or preservatives If you or your partner are pregnant or trying to get pregnant Breast-feeding How should I use this medication? This medication is injected into a vein. It is given by your care team in a hospital or clinic setting. Talk to your care team about the use of this  medication in children. Special care may be needed. Overdosage: If you think you have taken too much of this medicine contact a poison control center or emergency room at once. NOTE: This medicine is only for you. Do not share this medicine with others. What if I miss a dose? Keep appointments for follow-up doses. It is important not to miss your dose. Call your care team if you are unable to keep an appointment. What may interact with this medication? Other medications may affect the way this medication works. Talk with your care team about all of the medications you take. They may suggest changes to your treatment plan to lower the risk of side effects and to make sure your medications work as intended. This list may not describe all possible interactions. Give your health care provider a list of all the medicines, herbs, non-prescription drugs, or dietary supplements you use. Also tell them if you smoke, drink alcohol, or use illegal drugs. Some items may interact with your medicine. What should I watch for while using this medication? Your condition will be monitored carefully while you are receiving this  medication. You may need blood work while taking this medication. This medication may make you feel generally unwell. This is not uncommon as chemotherapy can affect healthy cells as well as cancer cells. Report any side effects. Continue your course of treatment even though you feel ill unless your care team tells you to stop. This medication can cause serious allergic reactions. To reduce the risk, your care team may give you other medications to take before receiving this one. Be sure to follow the directions from your care team. This medication may increase your risk of getting an infection. Call your care team for advice if you get a fever, chills, sore throat, or other symptoms of a cold or flu. Do not treat yourself. Try to avoid being around people who are sick. This medication may increase  your risk to bruise or bleed. Call your care team if you notice any unusual bleeding. Be careful brushing or flossing your teeth or using a toothpick because you may get an infection or bleed more easily. If you have any dental work done, tell your dentist you are receiving this medication. Talk to your care team if you or your partner may be pregnant. Serious birth defects can occur if you take this medication during pregnancy and for 6 months after the last dose. You will need a negative pregnancy test before starting this medication. Contraception is recommended while taking this medication and for 6 months after the last dose. Your care team can help you find the option that works for you. If your partner can get pregnant, use a condom during sex while taking this medication and for 3 months after the last dose. Do not breastfeed while taking this medication and for 2 weeks after the last dose. This medication may cause infertility. Talk to your care team if you are concerned about your fertility. What side effects may I notice from receiving this medication? Side effects that you should report to your care team as soon as possible: Allergic reactions--skin rash, itching, hives, swelling of the face, lips, tongue, or throat Dry cough, shortness of breath or trouble breathing Infection--fever, chills, cough, sore throat, wounds that don't heal, pain or trouble when passing urine, general feeling of discomfort or being unwell Low red blood cell level--unusual weakness or fatigue, dizziness, headache, trouble breathing Pain, tingling, or numbness in the hands or feet Stomach pain, unusual weakness or fatigue, nausea, vomiting, diarrhea, or fever that lasts longer than expected Unusual bruising or bleeding Side effects that usually do not require medical attention (report to your care team if they continue or are bothersome): Diarrhea Fatigue Hair loss Loss of appetite Nausea Vomiting This list  may not describe all possible side effects. Call your doctor for medical advice about side effects. You may report side effects to FDA at 1-800-FDA-1088. Where should I keep my medication? This medication is given in a hospital or clinic. It will not be stored at home. NOTE: This sheet is a summary. It may not cover all possible information. If you have questions about this medicine, talk to your doctor, pharmacist, or health care provider.  2024 Elsevier/Gold Standard (2021-08-29 00:00:00)

## 2024-02-09 NOTE — Progress Notes (Signed)
 Pt reports burning at IV site. Gemzar paused. Blood return noted, IV flushes without difficulty, no redness or swelling noted at site. Gemzar restarted and rate decreased to run over 45 minutes and NS running along side at 112ml/hr. Dr. Babara and Pharmacy made aware.  Pt reports these interventions resolved the burning at IV site.  1237: Pt reports IV site has started burning again, Gemzar paused. IV flushes without difficulty, blood return noted, no redness or swelling. Gemzar restarted at 300cc/hr and NS rate increased to 200cc/hr.  Pt tolerated remainder of infusion without difficulty. IV continues to flush without difficulty, blood return noted, no redness or swelling at IV site.  Pt stable at discharge.

## 2024-02-09 NOTE — Assessment & Plan Note (Signed)
 Status post right PCN.  She will need follow-up with IR for tube replacement in the future.

## 2024-02-09 NOTE — Progress Notes (Signed)
 Pt in for follow up, son is with pt, in from out of town. Pt reports good appetite and pain controlled.  States has sharp pains all of sudden but none now and not constant.

## 2024-02-09 NOTE — Assessment & Plan Note (Addendum)
 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 Baseline CA [id number]  The diagnosis and care plan were discussed with patient in detail.   Patient understands that her condition is not curable.  Treatment with palliative intent.  She desires palliative care chemotherapy.  Recommend gemcitabine +/- Abraxane # send NGS  Labs are reviewed and discussed with patient. Proceed with cycle 1 dose reduced Gem 800mg /m2 Ezequiel 100mg /m2.

## 2024-02-09 NOTE — Telephone Encounter (Signed)
 Received call from spouse requesting needles to flush nephrostomy tube. Only has 2 left. I will reach out to urology regarding supplies. He is also requesting xanax. Reports she is not taking ativan. Uses [facility].

## 2024-02-09 NOTE — Telephone Encounter (Signed)
 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 Pt understood the plan.

## 2024-02-09 NOTE — Assessment & Plan Note (Signed)
 Status post celiac plexus block. Continue morphine 15 mg every 4 hours as needed.

## 2024-02-09 NOTE — Progress Notes (Signed)
 Hematology/Oncology Consult note Telephone:([phone]) [phone] Fax:(336) [phone]       CHIEF COMPLAINTS/REASON FOR VISIT:  Metastatic pancreatic cancer   ASSESSMENT & PLAN:   Cancer Staging  Pancreatic cancer [facility]) Staging form: Exocrine Pancreas, AJCC 8th Edition - Clinical stage from 02/02/2024: Stage IV (cT3, cN1, pM1) - Signed by Babara Call, MD on 02/02/2024   Pancreatic cancer [facility]) Metastatic pancreatic cancer with omental involvement, retroperitoneal lymphadenopathy, right hydronephrosis due to obstruction Status post right PCN placement, status post celiac plexus block. Baseline CA 19-9 [id number]  The diagnosis and care plan were discussed with patient in detail.   Patient understands that her condition is not curable.  Treatment with palliative intent.  She desires palliative care chemotherapy.  Recommend gemcitabine +/- Abraxane # send NGS  Labs are reviewed and discussed with patient. Proceed with cycle 1 dose reduced Gem 800mg /m2 Ezequiel 100mg /m2.   Hydronephrosis of right kidney Status post right PCN.  She will need follow-up with IR for tube replacement in the future.  Neoplasm related pain Status post celiac plexus block. Continue morphine 15 mg every 4 hours as needed.  Encounter for antineoplastic chemotherapy Treatment plan as listed above    No orders of the defined types were placed in this encounter.  Follow-up i 1 week lab MD chemo.  All questions were answered. The patient knows to call the clinic with any problems, questions or concerns.  Call Babara, MD, PhD [facility] Hematology Oncology 02/09/2024   HISTORY OF PRESENTING ILLNESS:   Samantha Booker is a  73 y.o.  female presents for follow-up of thyroid  cancer management.  Oncology History  Pancreatic cancer (HCC)  01/18/2024 Initial Diagnosis   Pancreatic cancer   She has been experiencing abdominal pain and bloating for the past month, with the pain progressively worsening. The pain is severe  and debilitating at times, with episodes that have left her bedridden, such as during a recent trip to the beach. The pain is often accompanied by a sensation of bloating and fullness. She has also noticed recent constipation, despite previously regular bowel movements.  Patient was hospitalized for intractable abdominal pain  01/26/24 PCN placement on right & CT Guided Biopsy of omental mass-pathology showed metastatic adenocarcinoma. The cells are strongly and diffusely positive for CK7, focally and weakly positive for CK20 and CDX2. The cells are negative for GATA3, PAX8, TTF-1. The overall findings are in keeping with an adenocarcinoma with possible site of primary being pancreaticobiliary system (pancreatic ductal adenocarcinoma, cholangiocarcinoma), upper gastrointestinal tract (stomach, esophagus), lung and head&neck region. Correlation with radiological findings is recommended  01/27/24 Celiac Plexus Block    01/18/2024 Tumor Marker   CA19.9  14774   01/21/2024 Imaging   CT chest abdomen pelvis with contrast showed 1. Hypoenhancing mass centered in the pancreatic body and proximal tail measuring 5.0 x 3.8 cm, consistent with primary pancreatic adenocarcinoma. 2. Matted lymphadenopathy or soft tissue mass in the gastrohepatic ligament closely abutting the lesser curvature of the stomach, measuring 3.6 x 3.4 cm. Enlarged retroperitoneal lymph nodes measuring up to 1.5 x 1.3 cm. 3. Diffuse peritoneal nodularity with dense omental caking in the ventral abdomen, consistent with peritoneal carcinomatosis. 4. Severe right hydronephrosis and hydroureter, the distal ureter presumably obstructed by metastatic disease, although nidus of obstruction not directly visualized. 5. Occasional small bilateral pulmonary nodules, measuring up to 0.3 cm in the peripheral superior segment right lower lobe, nonspecific although suspicious for small pulmonary metastases. Attention on follow-up. 6.  Hepatic steatosis. 7. Coronary  artery disease.   Aortic Atherosclerosis (ICD10-I70.0).     02/02/2024 Cancer Staging   Staging form: Exocrine Pancreas, AJCC 8th Edition - Clinical stage from 02/02/2024: Stage IV (cT3, cN1, pM1) - Signed by Babara Call, MD on 02/02/2024 Stage prefix: Initial diagnosis   02/09/2024 -  Chemotherapy   Patient is on Treatment Plan : PANCREATIC Abraxane D1,8,15 + Gemcitabine D1,8,15 q28d       Today patient presents for follow-up. Patient reports that upper abdominal pain has improved after celiac plexus block.  She still has lower abdominal discomfort. She is able to tolerate morphine PRN.   Appetite is fair.  Weight is stable.  Patient coming by son  Pre- existing neuropathy toes. On lyrica   MEDICAL HISTORY:  Past Medical History:  Diagnosis Date   Actinic keratosis 10/30/2021   R forearm LN2 05/15/2022   Actinic keratosis 05/15/2022   left upper arm, treated with LN2 05/20/2022   Atherosclerosis of aorta 06/25/2016   Imaging Feb 2018   DDD (degenerative disc disease), cervical    Foliclar lymph grade III, unsp, nodes of head, face, and nk (HCC)    Hepatitis 1972   Hep B   History of basal cell carcinoma (BCC) 10/30/2021   right popliteal fossa BCC NODULAR PATTERN, BASE INVOLVED  treated with ED&C   History of hepatitis B    History of kidney stones    in past   Hyperlipidemia    Hypothyroidism    Pneumonia    as child    SURGICAL HISTORY: Past Surgical History:  Procedure Laterality Date   ANTERIOR AND POSTERIOR REPAIR WITH SACROSPINOUS FIXATION N/A 01/12/2023   Procedure: ANTERIOR AND POSTERIOR REPAIR WITH SACROSPINOUS FIXATION;  Surgeon: Marilynne Rosaline SAILOR, MD;  Location: [facility];  Service: Gynecology;  Laterality: N/A;  Total time requested is 1.5 hrs   APPENDECTOMY  1979   BOTOX  INJECTION N/A 09/24/2021   Procedure: BOTOX  INJECTION;  Surgeon: Rodolph Romano, MD;  Location: [facility];  Service: General;   Laterality: N/A;   CATARACT EXTRACTION Bilateral 2019   CHOLECYSTECTOMY  2011   COLONOSCOPY WITH PROPOFOL  N/A 05/20/2018   Procedure: COLONOSCOPY WITH PROPOFOL ;  Surgeon: Unk Corinn Skiff, MD;  Location: [facility];  Service: Gastroenterology;  Laterality: N/A;   CYSTOSCOPY  01/12/2023   Procedure: CYSTOSCOPY;  Surgeon: Marilynne Rosaline SAILOR, MD;  Location: [facility];  Service: Gynecology;;   ESOPHAGOGASTRODUODENOSCOPY (EGD) WITH PROPOFOL  N/A 07/21/2019   Procedure: ESOPHAGOGASTRODUODENOSCOPY (EGD) WITH PROPOFOL ;  Surgeon: Unk Corinn Skiff, MD;  Location: [facility];  Service: Endoscopy;  Laterality: N/A;   EYE SURGERY Left 10/05/2017   HEMORRHOID SURGERY N/A 09/24/2021   Procedure: HEMORRHOIDECTOMY;  Surgeon: Rodolph Romano, MD;  Location: [facility];  Service: General;  Laterality: N/A;   INGUINAL HERNIA REPAIR Left 12/15/2018   Procedure: LAPAROSCOPIC LEFT INGUINAL HERNIA VS OPEN;  Surgeon: Rodolph Romano, MD;  Location: [facility];  Service: General;  Laterality: Left;   IR NEPHROSTOMY PLACEMENT RIGHT  01/26/2024   LAPAROSCOPIC UNILATERAL SALPINGO OOPHERECTOMY Right 12/15/2018   Procedure: LAPAROSCOPIC UNILATERAL OOPHORECTOMY;  Surgeon: Ward, Mitzie BROCKS, MD;  Location: [facility];  Service: Gynecology;  Laterality: Right;   PERINEOPLASTY N/A 01/12/2023   Procedure: PERINEOPLASTY;  Surgeon: Marilynne Rosaline SAILOR, MD;  Location: [facility];  Service: Gynecology;  Laterality: N/A;   ROOT CANAL     suburethral sling  2012   Dr. Barbaraann   VAGINAL HYSTERECTOMY  1980   precancerous cells  SOCIAL HISTORY: Social History   Socioeconomic History   Marital status: Married    Spouse name: Not on file   Number of children: 2   Years of education: Not on file   Highest education level: Not on file  Occupational History   Occupation: retired  Tobacco Use   Smoking status: Former    Current packs/day: 0.00    Average packs/day: 2.0  packs/day for 20.0 years (40.0 ttl pk-yrs)    Types: Cigarettes    Start date: 12/09/1971    Quit date: 12/09/1991    Years since quitting: 32.1   Smokeless tobacco: Never  Vaping Use   Vaping status: Never Used  Substance and Sexual Activity   Alcohol use: Yes    Alcohol/week: 2.0 standard drinks of alcohol    Types: 2 Glasses of wine per week    Comment: occ   Drug use: No   Sexual activity: Yes    Partners: Male  Other Topics Concern   Not on file  Social History Narrative   Lives in [place]. Married. 2 children, 1 living in [place], 1 in [place]   Social Drivers of Health   Financial Resource Strain: Low Risk  (01/18/2024)   Overall Financial Resource Strain (CARDIA)    Difficulty of Paying Living Expenses: Not very hard  Food Insecurity: No Food Insecurity (01/26/2024)   Hunger Vital Sign    Worried About Running Out of Food in the Last Year: Never true    Ran Out of Food in the Last Year: Never true  Transportation Needs: No Transportation Needs (01/26/2024)   PRAPARE - Administrator, Civil Service (Medical): No    Lack of Transportation (Non-Medical): No  Physical Activity: Not on file  Stress: No Stress Concern Present (01/18/2024)   [employer] of Occupational Health - Occupational Stress Questionnaire    Feeling of Stress: Only a little  Social Connections: Unknown (01/26/2024)   Social Connection and Isolation Panel    Frequency of Communication with Friends and Family: Patient declined    Frequency of Social Gatherings with Friends and Family: Patient declined    Attends Religious Services: Patient declined    Database administrator or Organizations: Patient declined    Attends Banker Meetings: Patient declined    Marital Status: Married  Catering manager Violence: Not At Risk (01/26/2024)   Humiliation, Afraid, Rape, and Kick questionnaire    Fear of Current or Ex-Partner: No    Emotionally Abused: No    Physically Abused: No     Sexually Abused: No    FAMILY HISTORY: Family History  Problem Relation Age of Onset   Diabetes Mother    Heart disease Mother        CABG 2007   Hyperlipidemia Mother    COPD Mother    Hyperlipidemia Father    Heart disease Father    Parkinson's disease Brother    Cancer Maternal Grandmother        breast   Cancer Maternal Aunt        breast   Cancer Cousin        brain   Cancer Cousin        melanoma   Cancer Daughter        breast cancer    ALLERGIES:  is allergic to penicillins.  MEDICATIONS:  Current Outpatient Medications  Medication Sig Dispense Refill   acetaminophen  (TYLENOL ) 500 MG tablet Take 1 tablet (500 mg total) by mouth  every 6 (six) hours as needed (pain). 30 tablet 0   atorvastatin  (LIPITOR) 40 MG tablet Take 1 tablet (40 mg total) by mouth at bedtime. 90 tablet 1   docusate sodium (COLACE) 100 MG capsule Take 100 mg by mouth 2 (two) times daily.     levothyroxine  (SYNTHROID ) 100 MCG tablet Take 100 mcg by mouth daily before breakfast.     mirtazapine (REMERON) 15 MG tablet Take 1 tablet (15 mg total) by mouth at bedtime. 30 tablet 1   morphine (MSIR) 15 MG tablet Take 1 tablet (15 mg total) by mouth every 4 (four) hours as needed for severe pain (pain score 7-10) or moderate pain (pain score 4-6). 15 tablet 0   ondansetron  (ZOFRAN ) 8 MG tablet Take 1 tablet (8 mg total) by mouth every 8 (eight) hours as needed for nausea or vomiting. 30 tablet 1   pregabalin (LYRICA) 75 MG capsule Take 75 mg by mouth 2 (two) times daily.     PREMARIN  1.25 MG tablet Take 1.25 mg by mouth daily. Takes 1/4 of 1.25 mg daily     prochlorperazine (COMPAZINE) 10 MG tablet Take 1 tablet (10 mg total) by mouth every 6 (six) hours as needed for nausea or vomiting. 30 tablet 1   prochlorperazine (COMPAZINE) 5 MG tablet Take 1 tablet (5 mg total) by mouth every 6 (six) hours as needed for refractory nausea / vomiting. 30 tablet 0   venlafaxine XR (EFFEXOR-XR) 75 MG 24 hr capsule  Take by mouth.     acyclovir cream (ZOVIRAX) 5 % Apply 1 application topically every 4 (four) hours as needed (for cold sores).  (Patient not taking: Reported on 02/09/2024)     aspirin EC 81 MG tablet Take 1 tablet (81 mg total) by mouth daily. (Patient not taking: Reported on 02/09/2024)     BIOTIN PO Take 1 tablet by mouth daily. (Patient not taking: Reported on 02/09/2024)     cholecalciferol (VITAMIN D3) 25 MCG (1000 UT) tablet Take 1,000 Units by mouth daily. (Patient not taking: Reported on 02/09/2024)     estradiol  (ESTRACE ) 0.1 MG/GM vaginal cream Place 0.5g nightly for two weeks then twice a week after (Patient not taking: Reported on 02/02/2024) 30 g 11   hydrocortisone  (ANUSOL -HC) 2.5 % rectal cream Place 1 application rectally 2 (two) times daily. (Patient not taking: Reported on 02/09/2024) 30 g 1   No current facility-administered medications for this visit.   Facility-Administered Medications Ordered in Other Visits  Medication Dose Route Frequency Provider Last Rate Last Admin   0.9 %  sodium chloride  infusion   Intravenous Continuous Babara Call, MD 10 mL/hr at 02/09/24 0956 New Bag at 02/09/24 0956   gemcitabine (GEMZAR) 1,216 mg in sodium chloride  0.9 % 250 mL chemo infusion  800 mg/m2 (Treatment Plan Recorded) Intravenous Once Cathie Bonnell, MD       PACLitaxel-protein bound (ABRAXANE) chemo infusion 150 mg  100 mg/m2 (Treatment Plan Recorded) Intravenous Once Babara Call, MD        Review of Systems  Constitutional:  Negative for appetite change, chills, fatigue and fever.  HENT:   Negative for hearing loss and voice change.   Eyes:  Negative for eye problems.  Respiratory:  Negative for chest tightness and cough.   Cardiovascular:  Negative for chest pain.  Gastrointestinal:  Negative for abdominal distention, blood in stool, nausea and vomiting.       Abdominal bloating and discomfort.  Endocrine: Negative for hot flashes.  Genitourinary:  Negative for difficulty urinating and  frequency.   Musculoskeletal:  Negative for arthralgias.  Skin:  Negative for itching and rash.  Neurological:  Positive for numbness. Negative for extremity weakness.  Hematological:  Negative for adenopathy.  Psychiatric/Behavioral:  Negative for confusion.    PHYSICAL EXAMINATION:  Vitals:   02/09/24 0850  BP: (!) 143/88  Pulse: 90  Resp: 16  Temp: 97.6 F (36.4 C)  SpO2: 97%   Filed Weights   02/09/24 0850  Weight: 117 lb (53.1 kg)    Physical Exam Constitutional:      General: She is not in acute distress. HENT:     Head: Normocephalic and atraumatic.  Eyes:     General: No scleral icterus. Cardiovascular:     Rate and Rhythm: Normal rate and regular rhythm.     Heart sounds: Normal heart sounds.  Pulmonary:     Effort: Pulmonary effort is normal. No respiratory distress.     Breath sounds: Normal breath sounds.  Abdominal:     General: Bowel sounds are normal. There is distension.     Palpations: Abdomen is soft.  Musculoskeletal:        General: No deformity. Normal range of motion.     Cervical back: Normal range of motion and neck supple.  Skin:    General: Skin is warm and dry.     Findings: No erythema or rash.  Neurological:     Mental Status: She is alert and oriented to person, place, and time. Mental status is at baseline.  Psychiatric:        Mood and Affect: Mood normal.     LABORATORY DATA:  I have reviewed the data as listed    Latest Ref Rng & Units 02/09/2024    8:07 AM 01/28/2024    6:11 AM 01/27/2024    3:41 AM  CBC  WBC 4.0 - 10.5 K/uL 13.6   10.1   Hemoglobin 12.0 - 15.0 g/dL 88.2  86.1  87.7   Hematocrit 36.0 - 46.0 % 35.1   36.1   Platelets 150 - 400 K/uL 310   221       Latest Ref Rng & Units 02/09/2024    8:07 AM 01/29/2024    4:07 AM 01/28/2024    6:11 AM  CMP  Glucose 70 - 99 mg/dL 867  866  890   BUN 8 - 23 mg/dL 11  16  13    Creatinine 0.44 - 1.00 mg/dL 9.36  9.49  9.46   Sodium 135 - 145 mmol/L 135  137  137    Potassium 3.5 - 5.1 mmol/L 3.6  4.4  3.9   Chloride 98 - 111 mmol/L 100  107  105   CO2 22 - 32 mmol/L 22  19  18    Calcium  8.9 - 10.3 mg/dL 9.1  8.7  8.5   Total Protein 6.5 - 8.1 g/dL 7.1     Total Bilirubin 0.0 - 1.2 mg/dL 0.6     Alkaline Phos 38 - 126 U/L 94     AST 15 - 41 U/L 29     ALT 0 - 44 U/L 22         RADIOGRAPHIC STUDIES: I have personally reviewed the radiological images as listed and agreed with the findings in the report. DG Chest 1 View Result Date: 01/28/2024 CLINICAL DATA:  Shortness of breath. EXAM: CHEST  1 VIEW COMPARISON:  None Available. FINDINGS: The heart size and mediastinal contours are within  normal limits. Both lungs are clear. The visualized skeletal structures are unremarkable. IMPRESSION: No active disease. Electronically Signed   By: Lynwood Landy Raddle M.D.   On: 01/28/2024 16:33   CT CELIAC PLEXUS BLOCK NEUROLYTIC Result Date: 01/27/2024 CLINICAL DATA:  , Severe abdominal pain metastatic pancreatic carcinoma EXAM: CT GUIDED NEUROLYTIC ABLATION OF THE CELIAC AXIS ANESTHESIA/SEDATION: Intravenous Fentanyl  100mcg and Versed  1mg  were administered by RN during a total moderate (conscious) sedation time of 43 minutes; the patient's level of consciousness and physiological / cardiorespiratory status were monitored continuously by radiology RN under my direct supervision. PROCEDURE: The procedure risks, benefits, and alternatives were explained to the patient. Questions regarding the procedure were encouraged and answered. The patient understands and consents to the procedure. The anterior abdominal wall was prepped with chlorhexidine  in a sterile fashion, and a sterile drape was applied covering the operative field. A sterile gown and sterile gloves were used for the procedure. Local anesthesia was provided with 1% Lidocaine . A 22 gauge Chiba needle was advanced under CT guidance to the level of the celiac plexus to the left of midline. After confirming needle tip  position, approximately three ml of a 1:10 dilution of Omnipaque -300 contrast and saline was injected. Unilateral retroperitoneal spread of diluted contrast material was confirmed by CT. A 22 gauge Chiba needle was advanced under CT guidance to the level of the celiac plexus to the right of midline. After confirming needle tip position, approximately three ml of a 1:10 dilution of Omnipaque -300 contrast and saline was injected. Spread of diluted contrast material was confirmed by CT. 20 mL lidocaine  1% was then administered divided evenly between the 2 sites. Patient observed for 10 minutes. Hemodynamic stability was confirmed. Subsequently, alcohol ablation of the celiac plexus was then performed with injection of 30 ml of absolute alcohol. COMPLICATIONS: None FINDINGS: Contrast injection showed spread bilaterally at the level of the celiac plexus. Celiac plexus block was successfully performed without complication. Alcohol neurolysis was performed without complication. IMPRESSION: CT guided block and neurolytic ablation of the celiac plexus as above. Electronically Signed   By: JONETTA Faes M.D.   On: 01/27/2024 16:19   IR NEPHROSTOMY PLACEMENT RIGHT Result Date: 01/26/2024 INDICATION: Right-sided hydronephrosis in a patient with metastatic pancreatic cancer. Increasing abdominal pain which may be related to urinary obstruction. EXAM: Nephrostomy tube placement COMPARISON:  None Available. ANESTHESIA/SEDATION: Moderate (conscious) sedation was employed during this procedure. A total of Versed  0.5 mg and Fentanyl  25 mcg was administered intravenously by the radiology nurse. Total intra-service moderate Sedation Time: 10 minutes. The patient's level of consciousness and vital signs were monitored continuously by radiology nursing throughout the procedure under my direct supervision. CONTRAST:  10 mL Omnipaque  300-administered into the collecting system(s) FLUOROSCOPY: Radiation Exposure Index (as provided by the  fluoroscopic device): 5 mGy Kerma COMPLICATIONS: None immediate. PROCEDURE: Informed written consent was obtained from the patient after a thorough discussion of the procedural risks, benefits and alternatives. All questions were addressed. Maximal Sterile Barrier Technique was utilized including caps, mask, sterile gowns, sterile gloves, sterile drape, hand hygiene and skin antiseptic. A timeout was performed prior to the initiation of the procedure. In a prone position on the IR table, the right flank region was prepped and draped in usual sterile fashion. Local anesthesia was achieved by infiltrating subcutaneous tissue to the level of the renal capsule with 1 percent lidocaine  under ultrasound guidance. A small incision was then made in the right flank region. Through the incision an Accustick needle  was advanced with ultrasound guidance from the skin to the renal capsule and into a lower pole calyx. Calyx and renal pelvis are moderately dilated on ultrasound corresponding with findings from the recent CT of the abdomen and pelvis. The stylet was removed and clear urine was obtained at the hub of the needle. Small volume of contrast was injected demonstrating needle position within the renal collecting system. An 018 guidewire was then advanced under fluoroscopy and coiled in the renal pelvis. Access was then exchanged over the guidewire for an Accustick introducer set. The stiffener and introducer were then removed as well as the guidewire and a short Amplatz wire was advanced under fluoroscopic guidance. The sheath was then removed and the access site was dilated using a 10 [place] dilator. A 10 French percutaneous nephrostomy tube was then advanced over the guidewire and into position within the renal pelvis as the stiffener and guidewire were retrieved. Retention suture and sterile dressing applied. Catheter connected to gravity drainage. IMPRESSION: Satisfactory placement of a right-sided percutaneous  nephrostomy tube (10 [place]). The patient will return in approximately 8 weeks for routine exchange. Electronically Signed   By: Cordella Banner   On: 01/26/2024 14:31   CT ABDOMINAL MASS BIOPSY Result Date: 01/26/2024 INDICATION: The patient has multiple mesenteric lymph nodes, pancreatic tail mass, and significant bilateral omental thickening. EXAM: CT-guided biopsy TECHNIQUE: Multidetector CT imaging of the abdomen was performed following the standard protocol without IV contrast. RADIATION DOSE REDUCTION: This exam was performed according to the departmental dose-optimization program which includes automated exposure control, adjustment of the mA and/or kV according to patient size and/or use of iterative reconstruction technique. MEDICATIONS: None. ANESTHESIA/SEDATION: Moderate (conscious) sedation was employed during this procedure. A total of Versed  1 mg and Fentanyl  50 mcg was administered intravenously by the radiology nurse. Total intra-service moderate Sedation Time: 15 minutes. The patient's level of consciousness and vital signs were monitored continuously by radiology nursing throughout the procedure under my direct supervision. COMPLICATIONS: None immediate. PROCEDURE: Informed written consent was obtained from the patient after a thorough discussion of the procedural risks, benefits and alternatives. All questions were addressed. Maximal Sterile Barrier Technique was utilized including caps, mask, sterile gowns, sterile gloves, sterile drape, hand hygiene and skin antiseptic. A timeout was performed prior to the initiation of the procedure. In a supine position with radiopaque markers on the abdomen, initial axial images of the abdomen were obtained. Images were compared to the previous study of the abdomen and measurements were then obtained using the expected target region. The patient's skin was then prepped and draped in usual sterile fashion. Local anesthesia was achieved by infiltrating  subcutaneous tissue to the rectus muscle with 1% lidocaine . A 17 gauge trocar was then advanced from the skin incision through the abdomen rectus oriented towards the omental thickening on the left side. Two 18 gauge samples were then obtained using the BioPince needle. Further imaging was obtained and the trocar was repositioned and a third and final 18 gauge sample was obtained. Samples placed in formalin and sent to pathology. All needles withdrawn from the patient and sterile dressing applied. IMPRESSION: Satisfactory CT-guided omental mass biopsy as described above. Electronically Signed   By: Cordella Banner   On: 01/26/2024 13:56   CT CHEST ABDOMEN PELVIS W CONTRAST Result Date: 01/25/2024 CLINICAL DATA:  Pancreatic mass, abdominal pain and bloating * Tracking Code: BO * EXAM: CT CHEST, ABDOMEN, AND PELVIS WITH CONTRAST TECHNIQUE: Multidetector CT imaging of the chest, abdomen and pelvis  was performed following the standard protocol during bolus administration of intravenous contrast. RADIATION DOSE REDUCTION: This exam was performed according to the departmental dose-optimization program which includes automated exposure control, adjustment of the mA and/or kV according to patient size and/or use of iterative reconstruction technique. CONTRAST:  60mL OMNIPAQUE  IOHEXOL  300 MG/ML SOLN additional oral enteric contrast COMPARISON:  CT abdomen pelvis 01/12/2024 FINDINGS: CT CHEST FINDINGS Cardiovascular: Aortic atherosclerosis. Normal heart size. Three-vessel coronary artery calcifications. No pericardial effusion. Mediastinum/Nodes: No enlarged mediastinal, hilar, or axillary lymph nodes. Trachea and esophagus demonstrate no significant findings. Lungs/Pleura: Occasional small bilateral pulmonary nodules, measuring up to 0.3 cm in the peripheral superior segment right lower lobe (series 3, image 71). Background of extensive fine centrilobular nodularity throughout the lungs. Bandlike scarring of the  bilateral lung bases. No pleural effusion or pneumothorax. Musculoskeletal: No chest wall abnormality. No acute osseous findings. CT ABDOMEN PELVIS FINDINGS Hepatobiliary: No solid liver abnormality is seen. Hepatic steatosis. Cholecystectomy. No biliary ductal dilatation. Pancreas: Hypoenhancing mass centered in the pancreatic body and proximal tail measuring 5.0 x 3.8 cm (series 2, image 69). No pancreatic ductal dilatation or surrounding inflammatory changes. Spleen: Normal in size without significant abnormality. Adrenals/Urinary Tract: Adrenal glands are unremarkable. Severe right hydronephrosis and hydroureter, the distal ureter presumably obstructed by metastatic disease, although nidus of obstruction not directly visualized. No calculi. No left-sided hydronephrosis. Small nonobstructive bilateral renal calculi. Bladder is unremarkable. Stomach/Bowel: Stomach is within normal limits. Appendix appears normal. No evidence of bowel wall thickening, distention, or inflammatory changes. Vascular/Lymphatic: Aortic atherosclerosis. Matted lymphadenopathy or soft tissue mass in the gastrohepatic ligament closely abutting the lesser curvature of the stomach, measuring 3.6 x 3.4 cm (series 2, image 66). Enlarged retroperitoneal lymph nodes measuring up to 1.5 x 1.3 cm (series 2, image 62). Reproductive: No mass or other abnormality. Other: No abdominal wall hernia or abnormality. Small volume ascites. Diffuse peritoneal nodularity with dense omental caking in the ventral abdomen. Musculoskeletal: No acute osseous findings. IMPRESSION: 1. Hypoenhancing mass centered in the pancreatic body and proximal tail measuring 5.0 x 3.8 cm, consistent with primary pancreatic adenocarcinoma. 2. Matted lymphadenopathy or soft tissue mass in the gastrohepatic ligament closely abutting the lesser curvature of the stomach, measuring 3.6 x 3.4 cm. Enlarged retroperitoneal lymph nodes measuring up to 1.5 x 1.3 cm. 3. Diffuse peritoneal  nodularity with dense omental caking in the ventral abdomen, consistent with peritoneal carcinomatosis. 4. Severe right hydronephrosis and hydroureter, the distal ureter presumably obstructed by metastatic disease, although nidus of obstruction not directly visualized. 5. Occasional small bilateral pulmonary nodules, measuring up to 0.3 cm in the peripheral superior segment right lower lobe, nonspecific although suspicious for small pulmonary metastases. Attention on follow-up. 6. Hepatic steatosis. 7. Coronary artery disease. Aortic Atherosclerosis (ICD10-I70.0). Electronically Signed   By: Marolyn JONETTA Jaksch M.D.   On: 01/25/2024 13:35   CT ABDOMEN PELVIS WO CONTRAST Result Date: 01/12/2024 CLINICAL DATA:  Abdominal pain EXAM: CT ABDOMEN AND PELVIS WITHOUT CONTRAST TECHNIQUE: Multidetector CT imaging of the abdomen and pelvis was performed following the standard protocol without IV contrast. RADIATION DOSE REDUCTION: This exam was performed according to the departmental dose-optimization program which includes automated exposure control, adjustment of the mA and/or kV according to patient size and/or use of iterative reconstruction technique. COMPARISON:  10/22/2018 FINDINGS: Lower chest: No acute abnormality Hepatobiliary: No focal liver abnormality is seen. Status post cholecystectomy. No biliary dilatation. Pancreas: Pancreatic body mass suspected measuring 4.1 x 2.8 cm with truncation/atrophy of the pancreatic tail beyond  the mass. This is a new appearance when compared to prior study. No ductal dilatation. No surrounding inflammation. Spleen: No focal abnormality.  Normal size. Adrenals/Urinary Tract: Moderate right hydronephrosis and hydroureter. No visible obstructing stones. Area of cortical thinning/scarring in the mid to lower pole of the right kidney with parenchymal calcification, stable since prior study. Punctate nonobstructing left lower pole stone. No hydronephrosis on the left. Adrenal glands and  urinary bladder unremarkable. Stomach/Bowel: Stomach, large and small bowel grossly unremarkable. Vascular/Lymphatic: Heavily calcified aorta and iliac vessels. No evidence of aneurysm or adenopathy. Reproductive: Prior hysterectomy.  No adnexal masses. Other: Free fluid in the pelvis. No free air. There are peritoneal nodules noted in the anterior and left abdomen. Omental thickening/nodularity noted. Musculoskeletal: No acute or suspicious focal osseous abnormality. Scoliosis and degenerative changes in the lumbar spine. IMPRESSION: Masslike area in the pancreatic body measuring 4.1 x 2.8 cm with atrophy of the pancreatic tail. Findings concerning for pancreatic mass/neoplasm. Recommend further evaluation with MRI. 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 Small to mild free fluid in the pelvis. Right hydronephrosis and hydroureter without visible obstructing stone. Left nephrolithiasis. Aortoiliac atherosclerosis. Electronically Signed   By: Franky Crease M.D.   On: 01/12/2024 13:34

## 2024-02-09 NOTE — Addendum Note (Signed)
 Addended by: Finley Chevez E on: 02/09/2024 01:58 PM   Modules accepted: Orders

## 2024-02-10 ENCOUNTER — Telehealth: Payer: Self-pay

## 2024-02-10 ENCOUNTER — Inpatient Hospital Stay: Admitting: Licensed Clinical Social Worker

## 2024-02-10 ENCOUNTER — Other Ambulatory Visit: Payer: Self-pay | Admitting: Oncology

## 2024-02-10 ENCOUNTER — Other Ambulatory Visit: Payer: Self-pay

## 2024-02-10 ENCOUNTER — Telehealth: Payer: Self-pay | Admitting: *Deleted

## 2024-02-10 MED ORDER — ALPRAZOLAM 0.5 MG PO TABS: 0.5000 mg | ORAL_TABLET | Freq: Two times a day (BID) | ORAL | 0 refills | Status: DC | PRN

## 2024-02-10 MED ORDER — ALPRAZOLAM 0.5 MG PO TABS
0.5000 mg | ORAL_TABLET | Freq: Two times a day (BID) | ORAL | 0 refills | Status: DC | PRN
Start: 2024-02-10 — End: 2024-02-10

## 2024-02-10 MED ORDER — SODIUM CHLORIDE 0.9 % IJ SOLN: INTRAMUSCULAR | 5 refills | Status: DC

## 2024-02-10 NOTE — Telephone Encounter (Signed)
 Per Dr. Melanee, pt/family called on call doctor last night to report fever of 101.1. pt was advised to go to ER given fever and lethargy, but patient/family refused to go to ER. I was asked to follow-up today with the patient/family. I reached out to patient, pt's son in law Don answered as pt was sleeping. He reports that he would ask pt's spouse to return my phone call. Husband called back at 1052 am. Per husband, he reports that pt took 2 tylenol  last night. A few hours later, family reports that pt's energy and fevers improved. Patient's temp went down to 100.5 after tylenol . Pt is currently afebrile and is resting. This morning she was alert and feeling much better per spouse. I offered smc apt today in clinic, but husband declines as patient is feeling better. Husband understand that if fevers persist he needs to call our office back for smc apt. If after hours, she would be advised to go to ER.

## 2024-02-10 NOTE — Telephone Encounter (Signed)
 Saline flushes ordered to [facility].

## 2024-02-10 NOTE — Progress Notes (Signed)
 [facility] CSW Progress Note  Clinical Child psychotherapist contacted caregiver by phone to follow-up on distress screen needs.    Interventions:  CSW spoke with patient's husband, Web, because patient was asleep.  He stated that patient's first chemo treatment was more difficult than she thought.  She was doing better today.  Informed him CSW would meet with patient during her next treatment and he agreed.  No other needs at this time.       Follow Up Plan:  CSW will see patient on Tuesday, 10/21, during her infusion.    Macario CHRISTELLA Au, LCSW Clinical Social Worker [facility]

## 2024-02-10 NOTE — Telephone Encounter (Signed)
 Notified Mr. Brinley that saline flushes were sent to [facility] by Urology. Dr. Babara will send script for xanax. She is not to take both xanax and ativan.

## 2024-02-10 NOTE — Telephone Encounter (Signed)
 Rx for Xanax 0.5mg  has been sent to [facility].

## 2024-02-11 ENCOUNTER — Telehealth: Payer: Self-pay

## 2024-02-11 ENCOUNTER — Encounter: Payer: Self-pay | Admitting: Oncology

## 2024-02-11 NOTE — Telephone Encounter (Signed)
 Dr. Babara recommneds ER. Spoke to Samantha Booker who reports that she has a fever this morning and took extra strength Tylenol . Per son and spouse, Samantha Booker also had chills and weakness. Spoke to [place] (son), Web (spouse) and patient and informed them that Dr. Babara recommends ER given symptoms. Samantha Booker was hesistant on going to ER due to long wait, but I told her that I would contact ER and let them know and she agreed.  Called ER and spoke to [facility], notified her of Samantha Booker symptoms and that Samantha Booker's last chemo was on 10/14

## 2024-02-11 NOTE — Telephone Encounter (Signed)
 Received voicemail from son, Lemond, asking for a return call to discuss potentially adding steroids to her next treatment. He would also like to have  a port placed before her next treatment.

## 2024-02-11 NOTE — Telephone Encounter (Signed)
 Received live call from son, Lemond, reporting a fever this morning of 101.4 with chills. She has taken ES tylenol . They are calling as instructed when fever goes over 100.4. She does not feel like she needs to be seen at this time. Please advise.

## 2024-02-12 ENCOUNTER — Telehealth: Payer: Self-pay | Admitting: *Deleted

## 2024-02-12 ENCOUNTER — Other Ambulatory Visit: Payer: Self-pay

## 2024-02-12 ENCOUNTER — Other Ambulatory Visit: Payer: Self-pay | Admitting: Radiology

## 2024-02-12 ENCOUNTER — Telehealth: Payer: Self-pay

## 2024-02-12 ENCOUNTER — Other Ambulatory Visit: Payer: Self-pay | Admitting: Oncology

## 2024-02-12 DIAGNOSIS — C251 Malignant neoplasm of body of pancreas: Secondary | ICD-10-CM

## 2024-02-12 DIAGNOSIS — C801 Malignant (primary) neoplasm, unspecified: Secondary | ICD-10-CM | POA: Diagnosis not present

## 2024-02-12 NOTE — Progress Notes (Signed)
 Patient for IR Port Placement on Monday 02/15/24, I called and spoke with the patient on the phone and gave pre-procedure instructions. Pt was made aware to be here at 1p, NPO after MN prior to procedure as well as driver post procedure/recovery/discharge. Pt stated understanding.  Called 02/12/24

## 2024-02-12 NOTE — Telephone Encounter (Signed)
 Addressed in another ph note from Powell Lin, RN

## 2024-02-12 NOTE — Telephone Encounter (Signed)
 Port scheduled.  Patient/family aware

## 2024-02-12 NOTE — Telephone Encounter (Signed)
 Patient scheduled for port placement on Monday 10/20 at 2 pm to arrive at 1 pm.   Patient and family are aware.

## 2024-02-12 NOTE — Telephone Encounter (Signed)
 Son called back. Pt does not have any fever at the moment. If fever returns, the agreed to go to the ER. Pt is eating and drinking and family would like to continue to monitor.  Pt would like to pursue a port placement asap. They inquired if port could be placed on Monday 10/20. I explained that it is unlikely that an apt would be available for port placement on Monday given short notice. I would send msg to Dr. Layvonne team to obtain a future port placement apt. The pt is also insisting that Dr. Babara give her iv steroids at her next treatment and wanted to know what the steroids were not part of the treatment plan previously. The son stated that steroids were discussed in the chemo education class and we expected to have pt to have steroids on day of the infusion. We feel that the steroids will make her feel much better. I informed the son that steroids are not necessarily included in the treatment plan, as their use depends on the clinical situation. They can discuss this concern and the role of steroids at their next apt with Dr. Babara.

## 2024-02-12 NOTE — Progress Notes (Signed)
 Samantha Booker

## 2024-02-12 NOTE — Telephone Encounter (Signed)
 In response to the patient's mychart msg.--  I spoke with Dr. Babara. Pt has been advised multiple times to go to the ER due to fevers. Pt has nephrostomy tubes and is at risk for infection. I called and spoke with patient's son, Web. Patient continues to have fevers and is treating the fever with tylenol  as needed. We discussed the possibility that Tylenol  could be concealing a fever and an underlying infection. The patient was once again advised to seek evaluation at the emergency room but son stated that pt would most likely decline. Son does not want to have patient experience long waits in the ER and the care is not personable. Would like to have this evaluated in outpatient setting and do all the lab work up in the cancer center if possible. I will discuss this with my dad and mom and let you know what they decide. Mom has home health pt coming shortly to further evaluate her. I will and can call you back after this appointment. The home health nurse has been out several times to evaluate mom's nephrostomy tube and do the changes and syringe flushing and monitoring the nephrostomy site for infection.  I again explained to him that pt could still have a serious underlying infection. It is imperative that she see immediate evaluation at the emergency room to rule out infections. Despite this recommendation, pt is declining to go to ER at this time. They will discuss the situation as a family and then call our clinic back with their final decision. We discussed that even if the patient is brought into the clinic, there is no guarantee that an emergency room visit could be avoided. Given the potential seriousness of the condition, an ER evaluation may still be necessary.

## 2024-02-15 ENCOUNTER — Other Ambulatory Visit: Payer: Self-pay

## 2024-02-15 ENCOUNTER — Other Ambulatory Visit: Payer: Self-pay | Admitting: Oncology

## 2024-02-15 ENCOUNTER — Ambulatory Visit
Admission: RE | Admit: 2024-02-15 | Discharge: 2024-02-15 | Disposition: A | Discharge status: Home / Self Care | Source: Ambulatory Visit | Attending: Oncology | Admitting: Oncology

## 2024-02-15 DIAGNOSIS — C251 Malignant neoplasm of body of pancreas: Secondary | ICD-10-CM

## 2024-02-16 ENCOUNTER — Encounter: Payer: Self-pay | Admitting: Obstetrics and Gynecology

## 2024-02-16 ENCOUNTER — Inpatient Hospital Stay

## 2024-02-16 ENCOUNTER — Inpatient Hospital Stay: Admitting: Oncology

## 2024-02-16 ENCOUNTER — Encounter: Admitting: Hospice and Palliative Medicine

## 2024-02-16 ENCOUNTER — Encounter: Payer: Self-pay | Admitting: Oncology

## 2024-02-16 ENCOUNTER — Inpatient Hospital Stay: Admitting: Hospice and Palliative Medicine

## 2024-02-16 VITALS — BP 125/86 | HR 85 | Resp 18 | Ht 62.0 in | Wt 117.0 lb

## 2024-02-16 DIAGNOSIS — C801 Malignant (primary) neoplasm, unspecified: Secondary | ICD-10-CM | POA: Diagnosis not present

## 2024-02-16 DIAGNOSIS — C251 Malignant neoplasm of body of pancreas: Secondary | ICD-10-CM

## 2024-02-16 DIAGNOSIS — N1330 Unspecified hydronephrosis: Secondary | ICD-10-CM | POA: Diagnosis not present

## 2024-02-16 DIAGNOSIS — G893 Neoplasm related pain (acute) (chronic): Secondary | ICD-10-CM | POA: Diagnosis not present

## 2024-02-16 DIAGNOSIS — C786 Secondary malignant neoplasm of retroperitoneum and peritoneum: Secondary | ICD-10-CM | POA: Diagnosis not present

## 2024-02-16 DIAGNOSIS — Z5111 Encounter for antineoplastic chemotherapy: Secondary | ICD-10-CM | POA: Diagnosis not present

## 2024-02-16 LAB — CBC WITH DIFFERENTIAL (CANCER CENTER ONLY)
Abs Immature Granulocytes: 0.05 K/uL (ref 0.00–0.07)
Basophils Absolute: 0 K/uL (ref 0.0–0.1)
Basophils Relative: 0 %
Eosinophils Absolute: 0.1 K/uL (ref 0.0–0.5)
Eosinophils Relative: 2 %
HCT: 33.2 % — ABNORMAL LOW (ref 36.0–46.0)
Hemoglobin: 11.1 g/dL — ABNORMAL LOW (ref 12.0–15.0)
Immature Granulocytes: 1 %
Lymphocytes Relative: 24 %
Lymphs Abs: 1.3 K/uL (ref 0.7–4.0)
MCH: 30.2 pg (ref 26.0–34.0)
MCHC: 33.4 g/dL (ref 30.0–36.0)
MCV: 90.2 fL (ref 80.0–100.0)
Monocytes Absolute: 0.6 K/uL (ref 0.1–1.0)
Monocytes Relative: 12 %
Neutro Abs: 3.2 K/uL (ref 1.7–7.7)
Neutrophils Relative %: 61 %
Platelet Count: 159 K/uL (ref 150–400)
RBC: 3.68 MIL/uL — ABNORMAL LOW (ref 3.87–5.11)
RDW: 12.3 % (ref 11.5–15.5)
WBC Count: 5.3 K/uL (ref 4.0–10.5)
nRBC: 0 % (ref 0.0–0.2)

## 2024-02-16 LAB — CMP (CANCER CENTER ONLY)
ALT: 52 U/L — ABNORMAL HIGH (ref 0–44)
AST: 67 U/L — ABNORMAL HIGH (ref 15–41)
Albumin: 3.3 g/dL — ABNORMAL LOW (ref 3.5–5.0)
Alkaline Phosphatase: 147 U/L — ABNORMAL HIGH (ref 38–126)
Anion gap: 12 (ref 5–15)
BUN: 13 mg/dL (ref 8–23)
CO2: 23 mmol/L (ref 22–32)
Calcium: 9.4 mg/dL (ref 8.9–10.3)
Chloride: 99 mmol/L (ref 98–111)
Creatinine: 0.51 mg/dL (ref 0.44–1.00)
GFR, Estimated: >60 mL/min (ref >60)
Glucose, Bld: 118 mg/dL — ABNORMAL HIGH (ref 70–99)
Potassium: 3.4 mmol/L — ABNORMAL LOW (ref 3.5–5.1)
Sodium: 134 mmol/L — ABNORMAL LOW (ref 135–145)
Total Bilirubin: 0.4 mg/dL (ref 0.0–1.2)
Total Protein: 6.8 g/dL (ref 6.5–8.1)

## 2024-02-16 NOTE — Progress Notes (Signed)
 Patient reports discomfort with right kidney tube, recent reaction to chemo, and change in neuropathy.

## 2024-02-16 NOTE — Progress Notes (Signed)
 No IVF today

## 2024-02-16 NOTE — Progress Notes (Signed)
 Nutrition Assessment   Reason for Assessment:   New pancreatic cancer   ASSESSMENT:  73 year old female with metastatic pancreatic cancer.  Past medical history of hepatitis B, HLD, hydronephrosis of right kidney.  Received one treatment of gemzar  Met with patient and husband following MD visit.  Patient reports good appetite currently.  Was decreased some with pain but pain has improved with celiac plexus block.  Drinking ensure max protein daily.  Usually eating 3 meals a day.  Yesterday had ensure max protein for breakfast, salad with chicken for lunch and lasagna with salad for supper last night.  Reports some nausea but only took medication a few times.  Reports bowels are moving with medications.   Medications: remeron, compazine, zofran    Labs: reviewed   Anthropometrics:   Height: 62 inches Weight: 117 lb UBW: 131-133 lb BMI: 21  12% weight loss in the last month, significant   NUTRITION DIAGNOSIS: Unintentional weight loss related to cancer as evidenced by 12% weight loss in the last month   INTERVENTION:  Encouraged High Calorie, High protein diet as tolerated.  Handout provided Encouraged small frequent meals Encouraged 350 calorie shake if able to tolerate.  Sample of ensure complete provided.  Patient reports that her plan if to stop treatment and plan hospice.  Palliative Care to see patient.  Contact information provided    Next Visit: no follow-up RD available as needed  Yvon Mccord B. Dasie SOLON, CSO, LDN Registered Dietitian [phone]

## 2024-02-16 NOTE — Progress Notes (Signed)
 Hematology/Oncology Consult note Telephone:([phone] Fax:([phone]       CHIEF COMPLAINTS/REASON FOR VISIT:  Metastatic pancreatic cancer   ASSESSMENT & PLAN:   Cancer Staging  Pancreatic cancer [facility]) Staging form: Exocrine Pancreas, AJCC 8th Edition - Clinical stage from 02/02/2024: Stage IV (cT3, cN1, pM1) - Signed by Babara Call, MD on 02/02/2024   Pancreatic cancer [facility]) Metastatic pancreatic cancer with omental involvement, retroperitoneal lymphadenopathy, right hydronephrosis due to obstruction Status post right PCN placement, status post celiac plexus block. Baseline CA 19.9 14774  S/p cycle 1 D1 dose reduced Gem 800mg /m2 Ezequiel 100mg /m2.  She did not tolerate well. Patient does not want to have any more treatments and wish to focus on life qualities. She is interested in hospice/comfort care.  She will further discuss with palliative care provider today.   Hydronephrosis of right kidney Status post right PCN.  She will need follow-up with IR for tube replacement in the future.  Neoplasm related pain Status post celiac plexus block. Continue morphine 15 mg every 4 hours as needed.   No orders of the defined types were placed in this encounter.  All questions were answered. The patient knows to call the clinic with any problems, questions or concerns.  Call Babara, MD, PhD [facility] Hematology Oncology 02/16/2024   HISTORY OF PRESENTING ILLNESS:   Samantha Booker is a  73 y.o.  female presents for follow-up of thyroid  cancer management.  Oncology History  Pancreatic cancer (HCC)  01/18/2024 Initial Diagnosis   Pancreatic cancer   She has been experiencing abdominal pain and bloating for the past month, with the pain progressively worsening. The pain is severe and debilitating at times, with episodes that have left her bedridden, such as during a recent trip to the beach. The pain is often accompanied by a sensation of bloating and fullness. She has also  noticed recent constipation, despite previously regular bowel movements.  Patient was hospitalized for intractable abdominal pain  01/26/24 PCN placement on right & CT Guided Biopsy of omental mass-pathology showed metastatic adenocarcinoma. The cells are strongly and diffusely positive for CK7, focally and weakly positive for CK20 and CDX2. The cells are negative for GATA3, PAX8, TTF-1. The overall findings are in keeping with an adenocarcinoma with possible site of primary being pancreaticobiliary system (pancreatic ductal adenocarcinoma, cholangiocarcinoma), upper gastrointestinal tract (stomach, esophagus), lung and head&neck region. Correlation with radiological findings is recommended  01/27/24 Celiac Plexus Block    01/18/2024 Tumor Marker   CA19.9  14774   01/21/2024 Imaging   CT chest abdomen pelvis with contrast showed 1. Hypoenhancing mass centered in the pancreatic body and proximal tail measuring 5.0 x 3.8 cm, consistent with primary pancreatic adenocarcinoma. 2. Matted lymphadenopathy or soft tissue mass in the gastrohepatic ligament closely abutting the lesser curvature of the stomach, measuring 3.6 x 3.4 cm. Enlarged retroperitoneal lymph nodes measuring up to 1.5 x 1.3 cm. 3. Diffuse peritoneal nodularity with dense omental caking in the ventral abdomen, consistent with peritoneal carcinomatosis. 4. Severe right hydronephrosis and hydroureter, the distal ureter presumably obstructed by metastatic disease, although nidus of obstruction not directly visualized. 5. Occasional small bilateral pulmonary nodules, measuring up to 0.3 cm in the peripheral superior segment right lower lobe, nonspecific although suspicious for small pulmonary metastases. Attention on follow-up. 6. Hepatic steatosis. 7. Coronary artery disease.   Aortic Atherosclerosis (ICD10-I70.0).     02/02/2024 Cancer Staging   Staging form: Exocrine Pancreas, AJCC 8th Edition - Clinical stage from  02/02/2024: Stage  IV (cT3, cN1, pM1) - Signed by Babara Call, MD on 02/02/2024 Stage prefix: Initial diagnosis   02/09/2024 -  Chemotherapy   Patient is on Treatment Plan : PANCREATIC Abraxane D1,8,15 + Gemcitabine D1,8,15 q28d       Today patient presents for follow-up. Patient reports feeling miserable after first dose of Gemcitabine and Abraxane. She had fever episodes, weakness, rash over her body. She had contacted our office and was recommended to go to ER for further evaluation. She declined ER evaluation and took Tylenol .  Symptoms have all improved.  She feels better for the past 2 days. No nausea vomiting diarrhea. Afebrile today She reports discomfort at her PCN tube area.   Weight is stable.  Patient coming by husband   MEDICAL HISTORY:  Past Medical History:  Diagnosis Date   Actinic keratosis 10/30/2021   R forearm LN2 05/15/2022   Actinic keratosis 05/15/2022   left upper arm, treated with LN2 05/20/2022   Atherosclerosis of aorta 06/25/2016   Imaging Feb 2018   DDD (degenerative disc disease), cervical    Foliclar lymph grade III, unsp, nodes of head, face, and nk (HCC)    Hepatitis 1972   Hep B   History of basal cell carcinoma (BCC) 10/30/2021   right popliteal fossa BCC NODULAR PATTERN, BASE INVOLVED  treated with ED&C   History of hepatitis B    History of kidney stones    in past   Hyperlipidemia    Hypothyroidism    Pneumonia    as child    SURGICAL HISTORY: Past Surgical History:  Procedure Laterality Date   ANTERIOR AND POSTERIOR REPAIR WITH SACROSPINOUS FIXATION N/A 01/12/2023   Procedure: ANTERIOR AND POSTERIOR REPAIR WITH SACROSPINOUS FIXATION;  Surgeon: Marilynne Rosaline SAILOR, MD;  Location: [facility];  Service: Gynecology;  Laterality: N/A;  Total time requested is 1.5 hrs   APPENDECTOMY  1979   BOTOX  INJECTION N/A 09/24/2021   Procedure: BOTOX  INJECTION;  Surgeon: Rodolph Romano, MD;  Location: [facility];  Service: General;   Laterality: N/A;   CATARACT EXTRACTION Bilateral 2019   CHOLECYSTECTOMY  2011   COLONOSCOPY WITH PROPOFOL  N/A 05/20/2018   Procedure: COLONOSCOPY WITH PROPOFOL ;  Surgeon: Unk Corinn Skiff, MD;  Location: [facility];  Service: Gastroenterology;  Laterality: N/A;   CYSTOSCOPY  01/12/2023   Procedure: CYSTOSCOPY;  Surgeon: Marilynne Rosaline SAILOR, MD;  Location: [facility];  Service: Gynecology;;   ESOPHAGOGASTRODUODENOSCOPY (EGD) WITH PROPOFOL  N/A 07/21/2019   Procedure: ESOPHAGOGASTRODUODENOSCOPY (EGD) WITH PROPOFOL ;  Surgeon: Unk Corinn Skiff, MD;  Location: [facility];  Service: Endoscopy;  Laterality: N/A;   EYE SURGERY Left 10/05/2017   HEMORRHOID SURGERY N/A 09/24/2021   Procedure: HEMORRHOIDECTOMY;  Surgeon: Rodolph Romano, MD;  Location: [facility];  Service: General;  Laterality: N/A;   INGUINAL HERNIA REPAIR Left 12/15/2018   Procedure: LAPAROSCOPIC LEFT INGUINAL HERNIA VS OPEN;  Surgeon: Rodolph Romano, MD;  Location: [facility];  Service: General;  Laterality: Left;   IR NEPHROSTOMY PLACEMENT RIGHT  01/26/2024   LAPAROSCOPIC UNILATERAL SALPINGO OOPHERECTOMY Right 12/15/2018   Procedure: LAPAROSCOPIC UNILATERAL OOPHORECTOMY;  Surgeon: Ward, Mitzie BROCKS, MD;  Location: [facility];  Service: Gynecology;  Laterality: Right;   PERINEOPLASTY N/A 01/12/2023   Procedure: PERINEOPLASTY;  Surgeon: Marilynne Rosaline SAILOR, MD;  Location: [facility];  Service: Gynecology;  Laterality: N/A;   ROOT CANAL     suburethral sling  2012   Dr. Barbaraann   VAGINAL HYSTERECTOMY  [phone]  precancerous cells    SOCIAL HISTORY: Social History   Socioeconomic History   Marital status: Married    Spouse name: Not on file   Number of children: 2   Years of education: Not on file   Highest education level: Not on file  Occupational History   Occupation: retired  Tobacco Use   Smoking status: Former    Current packs/day: 0.00    Average packs/day: 2.0  packs/day for 20.0 years (40.0 ttl pk-yrs)    Types: Cigarettes    Start date: 12/09/1971    Quit date: 12/09/1991    Years since quitting: 32.2   Smokeless tobacco: Never  Vaping Use   Vaping status: Never Used  Substance and Sexual Activity   Alcohol use: Yes    Alcohol/week: 2.0 standard drinks of alcohol    Types: 2 Glasses of wine per week    Comment: occ   Drug use: No   Sexual activity: Yes    Partners: Male  Other Topics Concern   Not on file  Social History Narrative   Lives in [place]. Married. 2 children, 1 living in [place], 1 in [place]   Social Drivers of Health   Financial Resource Strain: Low Risk  (01/18/2024)   Overall Financial Resource Strain (CARDIA)    Difficulty of Paying Living Expenses: Not very hard  Food Insecurity: No Food Insecurity (01/26/2024)   Hunger Vital Sign    Worried About Running Out of Food in the Last Year: Never true    Ran Out of Food in the Last Year: Never true  Transportation Needs: No Transportation Needs (01/26/2024)   PRAPARE - Administrator, Civil Service (Medical): No    Lack of Transportation (Non-Medical): No  Physical Activity: Not on file  Stress: No Stress Concern Present (01/18/2024)   [employer] of Occupational Health - Occupational Stress Questionnaire    Feeling of Stress: Only a little  Social Connections: Unknown (01/26/2024)   Social Connection and Isolation Panel    Frequency of Communication with Friends and Family: Patient declined    Frequency of Social Gatherings with Friends and Family: Patient declined    Attends Religious Services: Patient declined    Database administrator or Organizations: Patient declined    Attends Banker Meetings: Patient declined    Marital Status: Married  Catering manager Violence: Not At Risk (01/26/2024)   Humiliation, Afraid, Rape, and Kick questionnaire    Fear of Current or Ex-Partner: No    Emotionally Abused: No    Physically Abused: No     Sexually Abused: No    FAMILY HISTORY: Family History  Problem Relation Age of Onset   Diabetes Mother    Heart disease Mother        CABG 2007   Hyperlipidemia Mother    COPD Mother    Hyperlipidemia Father    Heart disease Father    Parkinson's disease Brother    Cancer Maternal Grandmother        breast   Cancer Maternal Aunt        breast   Cancer Cousin        brain   Cancer Cousin        melanoma   Cancer Daughter        breast cancer    ALLERGIES:  is allergic to penicillins.  MEDICATIONS:  Current Outpatient Medications  Medication Sig Dispense Refill   acetaminophen  (TYLENOL ) 500 MG tablet Take 1 tablet (  500 mg total) by mouth every 6 (six) hours as needed (pain). 30 tablet 0   ALPRAZolam (XANAX) 0.5 MG tablet Take 1 tablet (0.5 mg total) by mouth 2 (two) times daily as needed for anxiety. 60 tablet 0   atorvastatin  (LIPITOR) 40 MG tablet Take 1 tablet (40 mg total) by mouth at bedtime. 90 tablet 1   docusate sodium (COLACE) 100 MG capsule Take 100 mg by mouth 2 (two) times daily.     levothyroxine  (SYNTHROID ) 100 MCG tablet Take 100 mcg by mouth daily before breakfast.     mirtazapine (REMERON) 15 MG tablet Take 1 tablet (15 mg total) by mouth at bedtime. 30 tablet 1   morphine (MSIR) 15 MG tablet Take 1 tablet (15 mg total) by mouth every 4 (four) hours as needed for severe pain (pain score 7-10) or moderate pain (pain score 4-6). 15 tablet 0   ondansetron  (ZOFRAN ) 8 MG tablet Take 1 tablet (8 mg total) by mouth every 8 (eight) hours as needed for nausea or vomiting. 30 tablet 1   pregabalin (LYRICA) 75 MG capsule Take 75 mg by mouth 2 (two) times daily.     PREMARIN  1.25 MG tablet Take 1.25 mg by mouth daily. Takes 1/4 of 1.25 mg daily     prochlorperazine (COMPAZINE) 10 MG tablet Take 1 tablet (10 mg total) by mouth every 6 (six) hours as needed for nausea or vomiting. 30 tablet 1   prochlorperazine (COMPAZINE) 5 MG tablet Take 1 tablet (5 mg total) by mouth  every 6 (six) hours as needed for refractory nausea / vomiting. 30 tablet 0   sodium chloride  0.9 % injection Flush nephrostomy tube once daily 300 mL 5   venlafaxine XR (EFFEXOR-XR) 75 MG 24 hr capsule Take by mouth.     acyclovir cream (ZOVIRAX) 5 % Apply 1 application topically every 4 (four) hours as needed (for cold sores).  (Patient not taking: Reported on 02/16/2024)     aspirin EC 81 MG tablet Take 1 tablet (81 mg total) by mouth daily. (Patient not taking: Reported on 02/16/2024)     BIOTIN PO Take 1 tablet by mouth daily. (Patient not taking: Reported on 02/16/2024)     cholecalciferol (VITAMIN D3) 25 MCG (1000 UT) tablet Take 1,000 Units by mouth daily. (Patient not taking: Reported on 02/16/2024)     estradiol  (ESTRACE ) 0.1 MG/GM vaginal cream Place 0.5g nightly for two weeks then twice a week after (Patient not taking: Reported on 02/16/2024) 30 g 11   hydrocortisone  (ANUSOL -HC) 2.5 % rectal cream Place 1 application rectally 2 (two) times daily. (Patient not taking: Reported on 02/16/2024) 30 g 1   No current facility-administered medications for this visit.    Review of Systems  Constitutional:  Negative for appetite change, chills, fatigue and fever.  HENT:   Negative for hearing loss and voice change.   Eyes:  Negative for eye problems.  Respiratory:  Negative for chest tightness and cough.   Cardiovascular:  Negative for chest pain.  Gastrointestinal:  Negative for abdominal distention, blood in stool, nausea and vomiting.  Endocrine: Negative for hot flashes.  Genitourinary:  Negative for difficulty urinating and frequency.   Musculoskeletal:  Negative for arthralgias.  Skin:  Negative for itching and rash.  Neurological:  Positive for numbness. Negative for extremity weakness.  Hematological:  Negative for adenopathy.  Psychiatric/Behavioral:  Negative for confusion.    PHYSICAL EXAMINATION:  Vitals:   02/16/24 0854  BP: 125/86  Pulse: 85  Resp: 18  SpO2: 100%    Filed Weights   02/16/24 0846  Weight: 117 lb (53.1 kg)    Physical Exam Constitutional:      General: She is not in acute distress. HENT:     Head: Normocephalic and atraumatic.  Eyes:     General: No scleral icterus. Cardiovascular:     Rate and Rhythm: Normal rate.  Pulmonary:     Effort: Pulmonary effort is normal. No respiratory distress.     Breath sounds: Normal breath sounds.  Abdominal:     General: Bowel sounds are normal. There is distension.  Musculoskeletal:        General: Normal range of motion.     Cervical back: Normal range of motion and neck supple.  Skin:    General: Skin is dry.     Findings: No erythema or rash.  Neurological:     Mental Status: She is alert and oriented to person, place, and time. Mental status is at baseline.  Psychiatric:        Mood and Affect: Mood normal.     LABORATORY DATA:  I have reviewed the data as listed    Latest Ref Rng & Units 02/16/2024    8:27 AM 02/09/2024    8:07 AM 01/28/2024    6:11 AM  CBC  WBC 4.0 - 10.5 K/uL 5.3  13.6    Hemoglobin 12.0 - 15.0 g/dL 88.8  88.2  86.1   Hematocrit 36.0 - 46.0 % 33.2  35.1    Platelets 150 - 400 K/uL 159  310        Latest Ref Rng & Units 02/16/2024    8:24 AM 02/09/2024    8:07 AM 01/29/2024    4:07 AM  CMP  Glucose 70 - 99 mg/dL 881  867  866   BUN 8 - 23 mg/dL 13  11  16    Creatinine 0.44 - 1.00 mg/dL 9.48  9.36  9.49   Sodium 135 - 145 mmol/L 134  135  137   Potassium 3.5 - 5.1 mmol/L 3.4  3.6  4.4   Chloride 98 - 111 mmol/L 99  100  107   CO2 22 - 32 mmol/L 23  22  19    Calcium  8.9 - 10.3 mg/dL 9.4  9.1  8.7   Total Protein 6.5 - 8.1 g/dL 6.8  7.1    Total Bilirubin 0.0 - 1.2 mg/dL 0.4  0.6    Alkaline Phos 38 - 126 U/L 147  94    AST 15 - 41 U/L 67  29    ALT 0 - 44 U/L 52  22        RADIOGRAPHIC STUDIES: I have personally reviewed the radiological images as listed and agreed with the findings in the report. DG Chest 1 View Result Date:  01/28/2024 CLINICAL DATA:  Shortness of breath. EXAM: CHEST  1 VIEW COMPARISON:  None Available. FINDINGS: The heart size and mediastinal contours are within normal limits. Both lungs are clear. The visualized skeletal structures are unremarkable. IMPRESSION: No active disease. Electronically Signed   By: Lynwood Landy Raddle M.D.   On: 01/28/2024 16:33   CT CELIAC PLEXUS BLOCK NEUROLYTIC Result Date: 01/27/2024 CLINICAL DATA:  , Severe abdominal pain metastatic pancreatic carcinoma EXAM: CT GUIDED NEUROLYTIC ABLATION OF THE CELIAC AXIS ANESTHESIA/SEDATION: Intravenous Fentanyl  100mcg and Versed  1mg  were administered by RN during a total moderate (conscious) sedation time of 43 minutes; the patient's level of consciousness and  physiological / cardiorespiratory status were monitored continuously by radiology RN under my direct supervision. PROCEDURE: The procedure risks, benefits, and alternatives were explained to the patient. Questions regarding the procedure were encouraged and answered. The patient understands and consents to the procedure. The anterior abdominal wall was prepped with chlorhexidine  in a sterile fashion, and a sterile drape was applied covering the operative field. A sterile gown and sterile gloves were used for the procedure. Local anesthesia was provided with 1% Lidocaine . A 22 gauge Chiba needle was advanced under CT guidance to the level of the celiac plexus to the left of midline. After confirming needle tip position, approximately three ml of a 1:10 dilution of Omnipaque -300 contrast and saline was injected. Unilateral retroperitoneal spread of diluted contrast material was confirmed by CT. A 22 gauge Chiba needle was advanced under CT guidance to the level of the celiac plexus to the right of midline. After confirming needle tip position, approximately three ml of a 1:10 dilution of Omnipaque -300 contrast and saline was injected. Spread of diluted contrast material was confirmed by CT. 20 mL  lidocaine  1% was then administered divided evenly between the 2 sites. Patient observed for 10 minutes. Hemodynamic stability was confirmed. Subsequently, alcohol ablation of the celiac plexus was then performed with injection of 30 ml of absolute alcohol. COMPLICATIONS: None FINDINGS: Contrast injection showed spread bilaterally at the level of the celiac plexus. Celiac plexus block was successfully performed without complication. Alcohol neurolysis was performed without complication. IMPRESSION: CT guided block and neurolytic ablation of the celiac plexus as above. Electronically Signed   By: JONETTA Faes M.D.   On: 01/27/2024 16:19   IR NEPHROSTOMY PLACEMENT RIGHT Result Date: 01/26/2024 INDICATION: Right-sided hydronephrosis in a patient with metastatic pancreatic cancer. Increasing abdominal pain which may be related to urinary obstruction. EXAM: Nephrostomy tube placement COMPARISON:  None Available. ANESTHESIA/SEDATION: Moderate (conscious) sedation was employed during this procedure. A total of Versed  0.5 mg and Fentanyl  25 mcg was administered intravenously by the radiology nurse. Total intra-service moderate Sedation Time: 10 minutes. The patient's level of consciousness and vital signs were monitored continuously by radiology nursing throughout the procedure under my direct supervision. CONTRAST:  10 mL Omnipaque  300-administered into the collecting system(s) FLUOROSCOPY: Radiation Exposure Index (as provided by the fluoroscopic device): 5 mGy Kerma COMPLICATIONS: None immediate. PROCEDURE: Informed written consent was obtained from the patient after a thorough discussion of the procedural risks, benefits and alternatives. All questions were addressed. Maximal Sterile Barrier Technique was utilized including caps, mask, sterile gowns, sterile gloves, sterile drape, hand hygiene and skin antiseptic. A timeout was performed prior to the initiation of the procedure. In a prone position on the IR table, the  right flank region was prepped and draped in usual sterile fashion. Local anesthesia was achieved by infiltrating subcutaneous tissue to the level of the renal capsule with 1 percent lidocaine  under ultrasound guidance. A small incision was then made in the right flank region. Through the incision an Accustick needle was advanced with ultrasound guidance from the skin to the renal capsule and into a lower pole calyx. Calyx and renal pelvis are moderately dilated on ultrasound corresponding with findings from the recent CT of the abdomen and pelvis. The stylet was removed and clear urine was obtained at the hub of the needle. Small volume of contrast was injected demonstrating needle position within the renal collecting system. An 018 guidewire was then advanced under fluoroscopy and coiled in the renal pelvis. Access was then exchanged over  the guidewire for an Accustick introducer set. The stiffener and introducer were then removed as well as the guidewire and a short Amplatz wire was advanced under fluoroscopic guidance. The sheath was then removed and the access site was dilated using a 10 [place] dilator. A 10 French percutaneous nephrostomy tube was then advanced over the guidewire and into position within the renal pelvis as the stiffener and guidewire were retrieved. Retention suture and sterile dressing applied. Catheter connected to gravity drainage. IMPRESSION: Satisfactory placement of a right-sided percutaneous nephrostomy tube (10 [place]). The patient will return in approximately 8 weeks for routine exchange. Electronically Signed   By: Cordella Banner   On: 01/26/2024 14:31   CT ABDOMINAL MASS BIOPSY Result Date: 01/26/2024 INDICATION: The patient has multiple mesenteric lymph nodes, pancreatic tail mass, and significant bilateral omental thickening. EXAM: CT-guided biopsy TECHNIQUE: Multidetector CT imaging of the abdomen was performed following the standard protocol without IV contrast. RADIATION  DOSE REDUCTION: This exam was performed according to the departmental dose-optimization program which includes automated exposure control, adjustment of the mA and/or kV according to patient size and/or use of iterative reconstruction technique. MEDICATIONS: None. ANESTHESIA/SEDATION: Moderate (conscious) sedation was employed during this procedure. A total of Versed  1 mg and Fentanyl  50 mcg was administered intravenously by the radiology nurse. Total intra-service moderate Sedation Time: 15 minutes. The patient's level of consciousness and vital signs were monitored continuously by radiology nursing throughout the procedure under my direct supervision. COMPLICATIONS: None immediate. PROCEDURE: Informed written consent was obtained from the patient after a thorough discussion of the procedural risks, benefits and alternatives. All questions were addressed. Maximal Sterile Barrier Technique was utilized including caps, mask, sterile gowns, sterile gloves, sterile drape, hand hygiene and skin antiseptic. A timeout was performed prior to the initiation of the procedure. In a supine position with radiopaque markers on the abdomen, initial axial images of the abdomen were obtained. Images were compared to the previous study of the abdomen and measurements were then obtained using the expected target region. The patient's skin was then prepped and draped in usual sterile fashion. Local anesthesia was achieved by infiltrating subcutaneous tissue to the rectus muscle with 1% lidocaine . A 17 gauge trocar was then advanced from the skin incision through the abdomen rectus oriented towards the omental thickening on the left side. Two 18 gauge samples were then obtained using the BioPince needle. Further imaging was obtained and the trocar was repositioned and a third and final 18 gauge sample was obtained. Samples placed in formalin and sent to pathology. All needles withdrawn from the patient and sterile dressing applied.  IMPRESSION: Satisfactory CT-guided omental mass biopsy as described above. Electronically Signed   By: Cordella Banner   On: 01/26/2024 13:56   CT CHEST ABDOMEN PELVIS W CONTRAST Result Date: 01/25/2024 CLINICAL DATA:  Pancreatic mass, abdominal pain and bloating * Tracking Code: BO * EXAM: CT CHEST, ABDOMEN, AND PELVIS WITH CONTRAST TECHNIQUE: Multidetector CT imaging of the chest, abdomen and pelvis was performed following the standard protocol during bolus administration of intravenous contrast. RADIATION DOSE REDUCTION: This exam was performed according to the departmental dose-optimization program which includes automated exposure control, adjustment of the mA and/or kV according to patient size and/or use of iterative reconstruction technique. CONTRAST:  60mL OMNIPAQUE  IOHEXOL  300 MG/ML SOLN additional oral enteric contrast COMPARISON:  CT abdomen pelvis 01/12/2024 FINDINGS: CT CHEST FINDINGS Cardiovascular: Aortic atherosclerosis. Normal heart size. Three-vessel coronary artery calcifications. No pericardial effusion. Mediastinum/Nodes: No enlarged mediastinal, hilar, or  axillary lymph nodes. Trachea and esophagus demonstrate no significant findings. Lungs/Pleura: Occasional small bilateral pulmonary nodules, measuring up to 0.3 cm in the peripheral superior segment right lower lobe (series 3, image 71). Background of extensive fine centrilobular nodularity throughout the lungs. Bandlike scarring of the bilateral lung bases. No pleural effusion or pneumothorax. Musculoskeletal: No chest wall abnormality. No acute osseous findings. CT ABDOMEN PELVIS FINDINGS Hepatobiliary: No solid liver abnormality is seen. Hepatic steatosis. Cholecystectomy. No biliary ductal dilatation. Pancreas: Hypoenhancing mass centered in the pancreatic body and proximal tail measuring 5.0 x 3.8 cm (series 2, image 69). No pancreatic ductal dilatation or surrounding inflammatory changes. Spleen: Normal in size without significant  abnormality. Adrenals/Urinary Tract: Adrenal glands are unremarkable. Severe right hydronephrosis and hydroureter, the distal ureter presumably obstructed by metastatic disease, although nidus of obstruction not directly visualized. No calculi. No left-sided hydronephrosis. Small nonobstructive bilateral renal calculi. Bladder is unremarkable. Stomach/Bowel: Stomach is within normal limits. Appendix appears normal. No evidence of bowel wall thickening, distention, or inflammatory changes. Vascular/Lymphatic: Aortic atherosclerosis. Matted lymphadenopathy or soft tissue mass in the gastrohepatic ligament closely abutting the lesser curvature of the stomach, measuring 3.6 x 3.4 cm (series 2, image 66). Enlarged retroperitoneal lymph nodes measuring up to 1.5 x 1.3 cm (series 2, image 62). Reproductive: No mass or other abnormality. Other: No abdominal wall hernia or abnormality. Small volume ascites. Diffuse peritoneal nodularity with dense omental caking in the ventral abdomen. Musculoskeletal: No acute osseous findings. IMPRESSION: 1. Hypoenhancing mass centered in the pancreatic body and proximal tail measuring 5.0 x 3.8 cm, consistent with primary pancreatic adenocarcinoma. 2. Matted lymphadenopathy or soft tissue mass in the gastrohepatic ligament closely abutting the lesser curvature of the stomach, measuring 3.6 x 3.4 cm. Enlarged retroperitoneal lymph nodes measuring up to 1.5 x 1.3 cm. 3. Diffuse peritoneal nodularity with dense omental caking in the ventral abdomen, consistent with peritoneal carcinomatosis. 4. Severe right hydronephrosis and hydroureter, the distal ureter presumably obstructed by metastatic disease, although nidus of obstruction not directly visualized. 5. Occasional small bilateral pulmonary nodules, measuring up to 0.3 cm in the peripheral superior segment right lower lobe, nonspecific although suspicious for small pulmonary metastases. Attention on follow-up. 6. Hepatic steatosis. 7.  Coronary artery disease. Aortic Atherosclerosis (ICD10-I70.0). Electronically Signed   By: Marolyn JONETTA Jaksch M.D.   On: 01/25/2024 13:35   CT ABDOMEN PELVIS WO CONTRAST Result Date: 01/12/2024 CLINICAL DATA:  Abdominal pain EXAM: CT ABDOMEN AND PELVIS WITHOUT CONTRAST TECHNIQUE: Multidetector CT imaging of the abdomen and pelvis was performed following the standard protocol without IV contrast. RADIATION DOSE REDUCTION: This exam was performed according to the departmental dose-optimization program which includes automated exposure control, adjustment of the mA and/or kV according to patient size and/or use of iterative reconstruction technique. COMPARISON:  10/22/2018 FINDINGS: Lower chest: No acute abnormality Hepatobiliary: No focal liver abnormality is seen. Status post cholecystectomy. No biliary dilatation. Pancreas: Pancreatic body mass suspected measuring 4.1 x 2.8 cm with truncation/atrophy of the pancreatic tail beyond the mass. This is a new appearance when compared to prior study. No ductal dilatation. No surrounding inflammation. Spleen: No focal abnormality.  Normal size. Adrenals/Urinary Tract: Moderate right hydronephrosis and hydroureter. No visible obstructing stones. Area of cortical thinning/scarring in the mid to lower pole of the right kidney with parenchymal calcification, stable since prior study. Punctate nonobstructing left lower pole stone. No hydronephrosis on the left. Adrenal glands and urinary bladder unremarkable. Stomach/Bowel: Stomach, large and small bowel grossly unremarkable. Vascular/Lymphatic: Heavily calcified aorta and  iliac vessels. No evidence of aneurysm or adenopathy. Reproductive: Prior hysterectomy.  No adnexal masses. Other: Free fluid in the pelvis. No free air. There are peritoneal nodules noted in the anterior and left abdomen. Omental thickening/nodularity noted. Musculoskeletal: No acute or suspicious focal osseous abnormality. Scoliosis and degenerative changes in  the lumbar spine. IMPRESSION: Masslike area in the pancreatic body measuring 4.1 x 2.8 cm with atrophy of the pancreatic tail. Findings concerning for pancreatic mass/neoplasm. Recommend further evaluation with MRI. 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 Small to mild free fluid in the pelvis. Right hydronephrosis and hydroureter without visible obstructing stone. Left nephrolithiasis. Aortoiliac atherosclerosis. Electronically Signed   By: Franky Crease M.D.   On: 01/12/2024 13:34

## 2024-02-16 NOTE — Assessment & Plan Note (Signed)
 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 Baseline CA 19.9 14774  S/p cycle 1 D1 dose reduced Gem 800mg /m2 Samantha Booker 100mg /m2.  She did not tolerate well. Patient does not want to have any more treatments and wish to focus on life qualities. She is interested in hospice/comfort care.  She will further discuss with palliative care provider today.

## 2024-02-16 NOTE — Assessment & Plan Note (Signed)
 Status post right PCN.  She will need follow-up with IR for tube replacement in the future.

## 2024-02-16 NOTE — Assessment & Plan Note (Signed)
 Status post celiac plexus block. Continue morphine 15 mg every 4 hours as needed.

## 2024-02-16 NOTE — Progress Notes (Signed)
 Palliative Medicine [facility] at [facility] [phone] [phone]   Name: Samantha Booker Date: 02/16/2024 MRN: [id number]  DOB: Aug 18, 1950  Patient Care Team: Auston Reyes BIRCH, MD as PCP - General (Internal Medicine) Babara Call, MD as Consulting Physician (Oncology) Maurie Rayfield BIRCH, RN as Oncology Nurse Navigator    REASON FOR CONSULTATION: Samantha Booker is a 73 y.o. female with multiple medical problems including pancreatic tail mass and peritoneal/omental nodularity concerning for pancreatic cancer with metastasis.  Patient has had significant abdominal pain.  She was referred to palliative care to address goals and manage ongoing symptoms.  SOCIAL HISTORY:     reports that she quit smoking about 32 years ago. Her smoking use included cigarettes. She started smoking about 52 years ago. She has a 40 pack-year smoking history. She has never used smokeless tobacco. She reports current alcohol use of about 2.0 standard drinks of alcohol per week. She reports that she does not use drugs.  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 (HCC)    Hepatitis 1972   Hep B   History of basal cell carcinoma (BCC) 10/30/2021   right popliteal fossa BCC NODULAR PATTERN, BASE INVOLVED  treated with ED&C   History of hepatitis B    History of kidney stones    in past   Hyperlipidemia    Hypothyroidism    Pneumonia    as child    PAST SURGICAL HISTORY:  Past Surgical History:   Procedure Laterality Date   ANTERIOR AND POSTERIOR REPAIR WITH SACROSPINOUS FIXATION N/A 01/12/2023   Procedure: ANTERIOR AND POSTERIOR REPAIR WITH SACROSPINOUS FIXATION;  Surgeon: Marilynne Rosaline SAILOR, MD;  Location: [facility];  Service: Gynecology;  Laterality: N/A;  Total time requested is 1.5 hrs   APPENDECTOMY  1979   BOTOX  INJECTION N/A 09/24/2021   Procedure: BOTOX  INJECTION;  Surgeon: Rodolph Romano, MD;  Location: [facility];  Service: General;  Laterality: N/A;   CATARACT EXTRACTION Bilateral 2019   CHOLECYSTECTOMY  2011   COLONOSCOPY WITH PROPOFOL  N/A 05/20/2018   Procedure: COLONOSCOPY WITH PROPOFOL ;  Surgeon: Unk Corinn Skiff, MD;  Location: [facility];  Service: Gastroenterology;  Laterality: N/A;   CYSTOSCOPY  01/12/2023   Procedure: CYSTOSCOPY;  Surgeon: Marilynne Rosaline SAILOR, MD;  Location: [facility];  Service: Gynecology;;   ESOPHAGOGASTRODUODENOSCOPY (EGD) WITH PROPOFOL  N/A 07/21/2019   Procedure: ESOPHAGOGASTRODUODENOSCOPY (EGD) WITH PROPOFOL ;  Surgeon: Unk Corinn Skiff, MD;  Location: [facility];  Service: Endoscopy;  Laterality: N/A;   EYE SURGERY Left 10/05/2017   HEMORRHOID SURGERY N/A 09/24/2021   Procedure: HEMORRHOIDECTOMY;  Surgeon: Rodolph Romano, MD;  Location: [facility];  Service: General;  Laterality: N/A;   INGUINAL HERNIA REPAIR Left 12/15/2018   Procedure: LAPAROSCOPIC LEFT INGUINAL HERNIA VS OPEN;  Surgeon: Rodolph Romano, MD;  Location: [facility];  Service: General;  Laterality: Left;   IR NEPHROSTOMY PLACEMENT RIGHT  01/26/2024   LAPAROSCOPIC UNILATERAL SALPINGO OOPHERECTOMY Right 12/15/2018  Procedure: LAPAROSCOPIC UNILATERAL OOPHORECTOMY;  Surgeon: Ward, Mitzie BROCKS, MD;  Location: [facility];  Service: Gynecology;  Laterality: Right;   PERINEOPLASTY N/A 01/12/2023   Procedure: PERINEOPLASTY;  Surgeon: Marilynne Rosaline SAILOR, MD;  Location: [facility];  Service: Gynecology;   Laterality: N/A;   ROOT CANAL     suburethral sling  2012   Dr. Barbaraann   VAGINAL HYSTERECTOMY  1980   precancerous cells    HEMATOLOGY/ONCOLOGY HISTORY:  Oncology History  Pancreatic cancer (HCC)  01/18/2024 Initial Diagnosis   Pancreatic cancer   She has been experiencing abdominal pain and bloating for the past month, with the pain progressively worsening. The pain is severe and debilitating at times, with episodes that have left her bedridden, such as during a recent trip to the beach. The pain is often accompanied by a sensation of bloating and fullness. She has also noticed recent constipation, despite previously regular bowel movements.  Patient was hospitalized for intractable abdominal pain  01/26/24 PCN placement on right & CT Guided Biopsy of omental mass-pathology showed metastatic adenocarcinoma. The cells are strongly and diffusely positive for CK7, focally and weakly positive for CK20 and CDX2. The cells are negative for GATA3, PAX8, TTF-1. The overall findings are in keeping with an adenocarcinoma with possible site of primary being pancreaticobiliary system (pancreatic ductal adenocarcinoma, cholangiocarcinoma), upper gastrointestinal tract (stomach, esophagus), lung and head&neck region. Correlation with radiological findings is recommended  01/27/24 Celiac Plexus Block    01/18/2024 Tumor Marker   CA19.9  [id number]   01/21/2024 Imaging   CT chest abdomen pelvis with contrast showed 1. Hypoenhancing mass centered in the pancreatic body and proximal tail measuring 5.0 x 3.8 cm, consistent with primary pancreatic adenocarcinoma. 2. Matted lymphadenopathy or soft tissue mass in the gastrohepatic ligament closely abutting the lesser curvature of the stomach, measuring 3.6 x 3.4 cm. Enlarged retroperitoneal lymph nodes measuring up to 1.5 x 1.3 cm. 3. Diffuse peritoneal nodularity with dense omental caking in the ventral abdomen, consistent with peritoneal  carcinomatosis. 4. Severe right hydronephrosis and hydroureter, the distal ureter presumably obstructed by metastatic disease, although nidus of obstruction not directly visualized. 5. Occasional small bilateral pulmonary nodules, measuring up to 0.3 cm in the peripheral superior segment right lower lobe, nonspecific although suspicious for small pulmonary metastases. Attention on follow-up. 6. Hepatic steatosis. 7. Coronary artery disease.   Aortic Atherosclerosis (ICD10-I70.0).     02/02/2024 Cancer Staging   Staging form: Exocrine Pancreas, AJCC 8th Edition - Clinical stage from 02/02/2024: Stage IV (cT3, cN1, pM1) - Signed by Babara Call, MD on 02/02/2024 Stage prefix: Initial diagnosis   02/09/2024 -  Chemotherapy   Patient is on Treatment Plan : PANCREATIC Abraxane D1,8,15 + Gemcitabine D1,8,15 q28d       ALLERGIES:  is allergic to penicillins.  MEDICATIONS:  Current Outpatient Medications  Medication Sig Dispense Refill   acetaminophen  (TYLENOL ) 500 MG tablet Take 1 tablet (500 mg total) by mouth every 6 (six) hours as needed (pain). 30 tablet 0   acyclovir cream (ZOVIRAX) 5 % Apply 1 application topically every 4 (four) hours as needed (for cold sores).  (Patient not taking: Reported on 02/16/2024)     ALPRAZolam (XANAX) 0.5 MG tablet Take 1 tablet (0.5 mg total) by mouth 2 (two) times daily as needed for anxiety. 60 tablet 0   aspirin EC 81 MG tablet Take 1 tablet (81 mg total) by mouth daily. (Patient not taking: Reported on 02/16/2024)     atorvastatin  (LIPITOR) 40  MG tablet Take 1 tablet (40 mg total) by mouth at bedtime. 90 tablet 1   BIOTIN PO Take 1 tablet by mouth daily. (Patient not taking: Reported on 02/16/2024)     cholecalciferol (VITAMIN D3) 25 MCG (1000 UT) tablet Take 1,000 Units by mouth daily. (Patient not taking: Reported on 02/16/2024)     docusate sodium (COLACE) 100 MG capsule Take 100 mg by mouth 2 (two) times daily.     estradiol  (ESTRACE ) 0.1 MG/GM  vaginal cream Place 0.5g nightly for two weeks then twice a week after (Patient not taking: Reported on 02/16/2024) 30 g 11   hydrocortisone  (ANUSOL -HC) 2.5 % rectal cream Place 1 application rectally 2 (two) times daily. (Patient not taking: Reported on 02/16/2024) 30 g 1   levothyroxine  (SYNTHROID ) 100 MCG tablet Take 100 mcg by mouth daily before breakfast.     mirtazapine (REMERON) 15 MG tablet Take 1 tablet (15 mg total) by mouth at bedtime. 30 tablet 1   morphine (MSIR) 15 MG tablet Take 1 tablet (15 mg total) by mouth every 4 (four) hours as needed for severe pain (pain score 7-10) or moderate pain (pain score 4-6). 15 tablet 0   ondansetron  (ZOFRAN ) 8 MG tablet Take 1 tablet (8 mg total) by mouth every 8 (eight) hours as needed for nausea or vomiting. 30 tablet 1   pregabalin (LYRICA) 75 MG capsule Take 75 mg by mouth 2 (two) times daily.     PREMARIN  1.25 MG tablet Take 1.25 mg by mouth daily. Takes 1/4 of 1.25 mg daily     prochlorperazine (COMPAZINE) 10 MG tablet Take 1 tablet (10 mg total) by mouth every 6 (six) hours as needed for nausea or vomiting. 30 tablet 1   prochlorperazine (COMPAZINE) 5 MG tablet Take 1 tablet (5 mg total) by mouth every 6 (six) hours as needed for refractory nausea / vomiting. 30 tablet 0   sodium chloride  0.9 % injection Flush nephrostomy tube once daily 300 mL 5   venlafaxine XR (EFFEXOR-XR) 75 MG 24 hr capsule Take by mouth.     No current facility-administered medications for this visit.    VITAL SIGNS: There were no vitals taken for this visit. There were no vitals filed for this visit.   Estimated body mass index is 21.4 kg/m as calculated from the following:   Height as of an earlier encounter on 02/16/24: 5' 2 (1.575 m).   Weight as of an earlier encounter on 02/16/24: 117 lb (53.1 kg).  LABS: CBC:    Component Value Date/Time   WBC 5.3 02/16/2024 0827   WBC 10.1 01/27/2024 0341   HGB 11.1 (L) 02/16/2024 0827   HGB 13.6 06/10/2016 0828    HCT 33.2 (L) 02/16/2024 0827   HCT 40.7 06/10/2016 0828   PLT 159 02/16/2024 0827   PLT 238 06/10/2016 0828   MCV 90.2 02/16/2024 0827   MCV 92 06/10/2016 0828   NEUTROABS 3.2 02/16/2024 0827   NEUTROABS 4.6 06/10/2016 0828   LYMPHSABS 1.3 02/16/2024 0827   LYMPHSABS 2.2 06/10/2016 0828   MONOABS 0.6 02/16/2024 0827   EOSABS 0.1 02/16/2024 0827   EOSABS 0.3 06/10/2016 0828   BASOSABS 0.0 02/16/2024 0827   BASOSABS 0.1 06/10/2016 0828   Comprehensive Metabolic Panel:    Component Value Date/Time   NA 134 (L) 02/16/2024 0824   NA 144 06/10/2016 0828   K 3.4 (L) 02/16/2024 0824   CL 99 02/16/2024 0824   CO2 23 02/16/2024 0824   BUN  13 02/16/2024 0824   BUN 11 06/10/2016 0828   CREATININE 0.51 02/16/2024 0824   CREATININE 0.63 06/24/2018 1146   GLUCOSE 118 (H) 02/16/2024 0824   CALCIUM  9.4 02/16/2024 0824   AST 67 (H) 02/16/2024 0824   ALT 52 (H) 02/16/2024 0824   ALKPHOS 147 (H) 02/16/2024 0824   BILITOT 0.4 02/16/2024 0824   PROT 6.8 02/16/2024 0824   PROT 6.4 06/10/2016 0828   ALBUMIN 3.3 (L) 02/16/2024 0824   ALBUMIN 4.1 06/10/2016 0828    RADIOGRAPHIC STUDIES: DG Chest 1 View Result Date: 01/28/2024 CLINICAL DATA:  Shortness of breath. EXAM: CHEST  1 VIEW COMPARISON:  None Available. FINDINGS: The heart size and mediastinal contours are within normal limits. Both lungs are clear. The visualized skeletal structures are unremarkable. IMPRESSION: No active disease. Electronically Signed   By: Lynwood Landy Raddle M.D.   On: 01/28/2024 16:33   CT CELIAC PLEXUS BLOCK NEUROLYTIC Result Date: 01/27/2024 CLINICAL DATA:  , Severe abdominal pain metastatic pancreatic carcinoma EXAM: CT GUIDED NEUROLYTIC ABLATION OF THE CELIAC AXIS ANESTHESIA/SEDATION: Intravenous Fentanyl  100mcg and Versed  1mg  were administered by RN during a total moderate (conscious) sedation time of 43 minutes; the patient's level of consciousness and physiological / cardiorespiratory status were monitored  continuously by radiology RN under my direct supervision. PROCEDURE: The procedure risks, benefits, and alternatives were explained to the patient. Questions regarding the procedure were encouraged and answered. The patient understands and consents to the procedure. The anterior abdominal wall was prepped with chlorhexidine  in a sterile fashion, and a sterile drape was applied covering the operative field. A sterile gown and sterile gloves were used for the procedure. Local anesthesia was provided with 1% Lidocaine . A 22 gauge Chiba needle was advanced under CT guidance to the level of the celiac plexus to the left of midline. After confirming needle tip position, approximately three ml of a 1:10 dilution of Omnipaque -300 contrast and saline was injected. Unilateral retroperitoneal spread of diluted contrast material was confirmed by CT. A 22 gauge Chiba needle was advanced under CT guidance to the level of the celiac plexus to the right of midline. After confirming needle tip position, approximately three ml of a 1:10 dilution of Omnipaque -300 contrast and saline was injected. Spread of diluted contrast material was confirmed by CT. 20 mL lidocaine  1% was then administered divided evenly between the 2 sites. Patient observed for 10 minutes. Hemodynamic stability was confirmed. Subsequently, alcohol ablation of the celiac plexus was then performed with injection of 30 ml of absolute alcohol. COMPLICATIONS: None FINDINGS: Contrast injection showed spread bilaterally at the level of the celiac plexus. Celiac plexus block was successfully performed without complication. Alcohol neurolysis was performed without complication. IMPRESSION: CT guided block and neurolytic ablation of the celiac plexus as above. Electronically Signed   By: JONETTA Faes M.D.   On: 01/27/2024 16:19   IR NEPHROSTOMY PLACEMENT RIGHT Result Date: 01/26/2024 INDICATION: Right-sided hydronephrosis in a patient with metastatic pancreatic cancer.  Increasing abdominal pain which may be related to urinary obstruction. EXAM: Nephrostomy tube placement COMPARISON:  None Available. ANESTHESIA/SEDATION: Moderate (conscious) sedation was employed during this procedure. A total of Versed  0.5 mg and Fentanyl  25 mcg was administered intravenously by the radiology nurse. Total intra-service moderate Sedation Time: 10 minutes. The patient's level of consciousness and vital signs were monitored continuously by radiology nursing throughout the procedure under my direct supervision. CONTRAST:  10 mL Omnipaque  300-administered into the collecting system(s) FLUOROSCOPY: Radiation Exposure Index (as provided by the  fluoroscopic device): 5 mGy Kerma COMPLICATIONS: None immediate. PROCEDURE: Informed written consent was obtained from the patient after a thorough discussion of the procedural risks, benefits and alternatives. All questions were addressed. Maximal Sterile Barrier Technique was utilized including caps, mask, sterile gowns, sterile gloves, sterile drape, hand hygiene and skin antiseptic. A timeout was performed prior to the initiation of the procedure. In a prone position on the IR table, the right flank region was prepped and draped in usual sterile fashion. Local anesthesia was achieved by infiltrating subcutaneous tissue to the level of the renal capsule with 1 percent lidocaine  under ultrasound guidance. A small incision was then made in the right flank region. Through the incision an Accustick needle was advanced with ultrasound guidance from the skin to the renal capsule and into a lower pole calyx. Calyx and renal pelvis are moderately dilated on ultrasound corresponding with findings from the recent CT of the abdomen and pelvis. The stylet was removed and clear urine was obtained at the hub of the needle. Small volume of contrast was injected demonstrating needle position within the renal collecting system. An 018 guidewire was then advanced under  fluoroscopy and coiled in the renal pelvis. Access was then exchanged over the guidewire for an Accustick introducer set. The stiffener and introducer were then removed as well as the guidewire and a short Amplatz wire was advanced under fluoroscopic guidance. The sheath was then removed and the access site was dilated using a 10 [place] dilator. A 10 French percutaneous nephrostomy tube was then advanced over the guidewire and into position within the renal pelvis as the stiffener and guidewire were retrieved. Retention suture and sterile dressing applied. Catheter connected to gravity drainage. IMPRESSION: Satisfactory placement of a right-sided percutaneous nephrostomy tube (10 [place]). The patient will return in approximately 8 weeks for routine exchange. Electronically Signed   By: Cordella Banner   On: 01/26/2024 14:31   CT ABDOMINAL MASS BIOPSY Result Date: 01/26/2024 INDICATION: The patient has multiple mesenteric lymph nodes, pancreatic tail mass, and significant bilateral omental thickening. EXAM: CT-guided biopsy TECHNIQUE: Multidetector CT imaging of the abdomen was performed following the standard protocol without IV contrast. RADIATION DOSE REDUCTION: This exam was performed according to the departmental dose-optimization program which includes automated exposure control, adjustment of the mA and/or kV according to patient size and/or use of iterative reconstruction technique. MEDICATIONS: None. ANESTHESIA/SEDATION: Moderate (conscious) sedation was employed during this procedure. A total of Versed  1 mg and Fentanyl  50 mcg was administered intravenously by the radiology nurse. Total intra-service moderate Sedation Time: 15 minutes. The patient's level of consciousness and vital signs were monitored continuously by radiology nursing throughout the procedure under my direct supervision. COMPLICATIONS: None immediate. PROCEDURE: Informed written consent was obtained from the patient after a thorough  discussion of the procedural risks, benefits and alternatives. All questions were addressed. Maximal Sterile Barrier Technique was utilized including caps, mask, sterile gowns, sterile gloves, sterile drape, hand hygiene and skin antiseptic. A timeout was performed prior to the initiation of the procedure. In a supine position with radiopaque markers on the abdomen, initial axial images of the abdomen were obtained. Images were compared to the previous study of the abdomen and measurements were then obtained using the expected target region. The patient's skin was then prepped and draped in usual sterile fashion. Local anesthesia was achieved by infiltrating subcutaneous tissue to the rectus muscle with 1% lidocaine . A 17 gauge trocar was then advanced from the skin incision through the abdomen rectus  oriented towards the omental thickening on the left side. Two 18 gauge samples were then obtained using the BioPince needle. Further imaging was obtained and the trocar was repositioned and a third and final 18 gauge sample was obtained. Samples placed in formalin and sent to pathology. All needles withdrawn from the patient and sterile dressing applied. IMPRESSION: Satisfactory CT-guided omental mass biopsy as described above. Electronically Signed   By: Cordella Banner   On: 01/26/2024 13:56   CT CHEST ABDOMEN PELVIS W CONTRAST Result Date: 01/25/2024 CLINICAL DATA:  Pancreatic mass, abdominal pain and bloating * Tracking Code: BO * EXAM: CT CHEST, ABDOMEN, AND PELVIS WITH CONTRAST TECHNIQUE: Multidetector CT imaging of the chest, abdomen and pelvis was performed following the standard protocol during bolus administration of intravenous contrast. RADIATION DOSE REDUCTION: This exam was performed according to the departmental dose-optimization program which includes automated exposure control, adjustment of the mA and/or kV according to patient size and/or use of iterative reconstruction technique. CONTRAST:  60mL  OMNIPAQUE  IOHEXOL  300 MG/ML SOLN additional oral enteric contrast COMPARISON:  CT abdomen pelvis 01/12/2024 FINDINGS: CT CHEST FINDINGS Cardiovascular: Aortic atherosclerosis. Normal heart size. Three-vessel coronary artery calcifications. No pericardial effusion. Mediastinum/Nodes: No enlarged mediastinal, hilar, or axillary lymph nodes. Trachea and esophagus demonstrate no significant findings. Lungs/Pleura: Occasional small bilateral pulmonary nodules, measuring up to 0.3 cm in the peripheral superior segment right lower lobe (series 3, image 71). Background of extensive fine centrilobular nodularity throughout the lungs. Bandlike scarring of the bilateral lung bases. No pleural effusion or pneumothorax. Musculoskeletal: No chest wall abnormality. No acute osseous findings. CT ABDOMEN PELVIS FINDINGS Hepatobiliary: No solid liver abnormality is seen. Hepatic steatosis. Cholecystectomy. No biliary ductal dilatation. Pancreas: Hypoenhancing mass centered in the pancreatic body and proximal tail measuring 5.0 x 3.8 cm (series 2, image 69). No pancreatic ductal dilatation or surrounding inflammatory changes. Spleen: Normal in size without significant abnormality. Adrenals/Urinary Tract: Adrenal glands are unremarkable. Severe right hydronephrosis and hydroureter, the distal ureter presumably obstructed by metastatic disease, although nidus of obstruction not directly visualized. No calculi. No left-sided hydronephrosis. Small nonobstructive bilateral renal calculi. Bladder is unremarkable. Stomach/Bowel: Stomach is within normal limits. Appendix appears normal. No evidence of bowel wall thickening, distention, or inflammatory changes. Vascular/Lymphatic: Aortic atherosclerosis. Matted lymphadenopathy or soft tissue mass in the gastrohepatic ligament closely abutting the lesser curvature of the stomach, measuring 3.6 x 3.4 cm (series 2, image 66). Enlarged retroperitoneal lymph nodes measuring up to 1.5 x 1.3 cm  (series 2, image 62). Reproductive: No mass or other abnormality. Other: No abdominal wall hernia or abnormality. Small volume ascites. Diffuse peritoneal nodularity with dense omental caking in the ventral abdomen. Musculoskeletal: No acute osseous findings. IMPRESSION: 1. Hypoenhancing mass centered in the pancreatic body and proximal tail measuring 5.0 x 3.8 cm, consistent with primary pancreatic adenocarcinoma. 2. Matted lymphadenopathy or soft tissue mass in the gastrohepatic ligament closely abutting the lesser curvature of the stomach, measuring 3.6 x 3.4 cm. Enlarged retroperitoneal lymph nodes measuring up to 1.5 x 1.3 cm. 3. Diffuse peritoneal nodularity with dense omental caking in the ventral abdomen, consistent with peritoneal carcinomatosis. 4. Severe right hydronephrosis and hydroureter, the distal ureter presumably obstructed by metastatic disease, although nidus of obstruction not directly visualized. 5. Occasional small bilateral pulmonary nodules, measuring up to 0.3 cm in the peripheral superior segment right lower lobe, nonspecific although suspicious for small pulmonary metastases. Attention on follow-up. 6. Hepatic steatosis. 7. Coronary artery disease. Aortic Atherosclerosis (ICD10-I70.0). Electronically Signed   By:  Marolyn JONETTA Jaksch M.D.   On: 01/25/2024 13:35    PERFORMANCE STATUS (ECOG) : 3 - Symptomatic, >50% confined to bed  Review of Systems Unless otherwise noted, a complete review of systems is negative.  Physical Exam General: Thin, frail-appearing Cardiovascular: regular rate and rhythm Pulmonary: Unlabored Extremities: no edema, no joint deformities Skin: no rashes Neurological: Weakness but otherwise nonfocal  IMPRESSION: Follow-up visit.  Patient accompanied by her husband.  Patient did not feel that she tolerated chemotherapy well.  Subsequently, she has decided to forego future treatment.  She is tearful as she explained the desire to focus on comfort and  quality of life for however long she has left.  She reports her family agrees with this decision.  We discussed option of hospice involvement.  Patient has been thought hospice would be a good idea.  Will send referral to [facility].   Patient states that she is not interested in resuscitation nor would she want her life prolonged artificially machines.  She is in agreement to DNR/DNI. I signed a DNR order for her to take home today.  Symptomatically, patient feels that she is doing better.  She denies pain or other distressing symptoms today.  PLAN: - Best supportive care -Referral to hospice - Continue MS IR as needed for pain - RTC TBD    Time Total: 15 minutes  Visit consisted of counseling and education dealing with the complex and emotionally intense issues of symptom management and palliative care in the setting of serious and potentially life-threatening illness.Greater than 50%  of this time was spent counseling and coordinating care related to the above assessment and plan.  Signed by: Fonda Mower, PhD, NP-C

## 2024-02-17 ENCOUNTER — Encounter: Payer: Self-pay | Admitting: Oncology

## 2024-02-17 NOTE — Telephone Encounter (Signed)
 Testing has resulted. Results sent to scanning.

## 2024-02-18 ENCOUNTER — Encounter: Payer: Self-pay | Admitting: Oncology

## 2024-02-18 ENCOUNTER — Telehealth: Payer: Self-pay

## 2024-02-18 DIAGNOSIS — R300 Dysuria: Secondary | ICD-10-CM

## 2024-02-18 MED ORDER — SULFAMETHOXAZOLE-TRIMETHOPRIM 800-160 MG PO TABS: 1.0000 | ORAL_TABLET | Freq: Two times a day (BID) | ORAL | 0 refills | Status: DC

## 2024-02-18 NOTE — Telephone Encounter (Signed)
 Pt family called after hours triage line. Pt having flank pain and spreading of redness from neprosotomy tube. After confirming with Sam, PA we are calling in an antibiotic for her.

## 2024-02-19 ENCOUNTER — Encounter: Payer: Self-pay | Admitting: Oncology

## 2024-02-23 ENCOUNTER — Ambulatory Visit: Admitting: Oncology

## 2024-02-23 ENCOUNTER — Ambulatory Visit

## 2024-02-23 ENCOUNTER — Other Ambulatory Visit

## 2024-03-25 ENCOUNTER — Other Ambulatory Visit (HOSPITAL_BASED_OUTPATIENT_CLINIC_OR_DEPARTMENT_OTHER): Payer: Self-pay

## 2024-03-29 ENCOUNTER — Ambulatory Visit: Admitting: Physician Assistant

## 2024-04-28 DEATH — deceased

## 2024-05-02 ENCOUNTER — Ambulatory Visit: Admitting: Physician Assistant

## 2024-05-30 ENCOUNTER — Encounter: Payer: PPO | Admitting: Dermatology
